# Patient Record
Sex: Male | Born: 1945 | Race: White | Hispanic: No | Marital: Married | Smoking: Current every day smoker
Health system: Southern US, Community
[De-identification: ages and names within clinical notes are randomized; demographics above are authoritative.]

## PROBLEM LIST (undated history)

## (undated) DIAGNOSIS — I219 Acute myocardial infarction, unspecified: Secondary | ICD-10-CM

## (undated) DIAGNOSIS — I2510 Atherosclerotic heart disease of native coronary artery without angina pectoris: Secondary | ICD-10-CM

## (undated) DIAGNOSIS — E785 Hyperlipidemia, unspecified: Secondary | ICD-10-CM

## (undated) DIAGNOSIS — K219 Gastro-esophageal reflux disease without esophagitis: Secondary | ICD-10-CM

## (undated) DIAGNOSIS — I6529 Occlusion and stenosis of unspecified carotid artery: Secondary | ICD-10-CM

## (undated) DIAGNOSIS — I739 Peripheral vascular disease, unspecified: Secondary | ICD-10-CM

## (undated) DIAGNOSIS — Z951 Presence of aortocoronary bypass graft: Secondary | ICD-10-CM

## (undated) DIAGNOSIS — I679 Cerebrovascular disease, unspecified: Secondary | ICD-10-CM

## (undated) DIAGNOSIS — K649 Unspecified hemorrhoids: Secondary | ICD-10-CM

## (undated) DIAGNOSIS — Z973 Presence of spectacles and contact lenses: Secondary | ICD-10-CM

## (undated) DIAGNOSIS — Z972 Presence of dental prosthetic device (complete) (partial): Secondary | ICD-10-CM

## (undated) DIAGNOSIS — S37019A Minor contusion of unspecified kidney, initial encounter: Secondary | ICD-10-CM

## (undated) HISTORY — PX: NECK SURGERY: SHX720

## (undated) HISTORY — DX: Atherosclerotic heart disease of native coronary artery without angina pectoris: I25.10

## (undated) HISTORY — DX: Hyperlipidemia, unspecified: E78.5

## (undated) HISTORY — PX: TONSILLECTOMY: SUR1361

## (undated) HISTORY — DX: Minor contusion of unspecified kidney, initial encounter: S37.019A

## (undated) HISTORY — PX: EYE SURGERY: SHX253

## (undated) HISTORY — DX: Occlusion and stenosis of unspecified carotid artery: I65.29

## (undated) HISTORY — PX: COLONOSCOPY WITH ESOPHAGOGASTRODUODENOSCOPY (EGD): SHX5779

## (undated) HISTORY — DX: Acute myocardial infarction, unspecified: I21.9

## (undated) HISTORY — PX: CORONARY ARTERY BYPASS GRAFT: SHX141

## (undated) HISTORY — DX: Cerebrovascular disease, unspecified: I67.9

## (undated) HISTORY — DX: Presence of aortocoronary bypass graft: Z95.1

---

## 1983-09-21 DIAGNOSIS — I219 Acute myocardial infarction, unspecified: Secondary | ICD-10-CM

## 1983-09-21 HISTORY — DX: Acute myocardial infarction, unspecified: I21.9

## 1993-09-20 HISTORY — PX: CERVICAL SPINE SURGERY: SHX589

## 1999-12-15 ENCOUNTER — Encounter: Payer: Self-pay | Admitting: Cardiology

## 1999-12-15 ENCOUNTER — Inpatient Hospital Stay (HOSPITAL_COMMUNITY): Admission: AD | Admit: 1999-12-15 | Discharge: 1999-12-16 | Payer: Self-pay | Admitting: Cardiology

## 1999-12-16 ENCOUNTER — Encounter: Payer: Self-pay | Admitting: Cardiology

## 2003-09-21 DIAGNOSIS — S37019A Minor contusion of unspecified kidney, initial encounter: Secondary | ICD-10-CM

## 2003-09-21 HISTORY — DX: Minor contusion of unspecified kidney, initial encounter: S37.019A

## 2004-09-02 ENCOUNTER — Ambulatory Visit: Payer: Self-pay | Admitting: Cardiology

## 2004-12-11 ENCOUNTER — Ambulatory Visit: Payer: Self-pay

## 2004-12-11 ENCOUNTER — Ambulatory Visit: Payer: Self-pay | Admitting: Cardiology

## 2006-09-15 ENCOUNTER — Ambulatory Visit: Payer: Self-pay | Admitting: Cardiology

## 2007-01-11 ENCOUNTER — Ambulatory Visit: Payer: Self-pay | Admitting: Cardiology

## 2007-01-11 ENCOUNTER — Inpatient Hospital Stay (HOSPITAL_COMMUNITY): Admission: EM | Admit: 2007-01-11 | Discharge: 2007-01-12 | Payer: Self-pay | Admitting: Emergency Medicine

## 2007-01-12 ENCOUNTER — Encounter: Payer: Self-pay | Admitting: Vascular Surgery

## 2007-01-12 ENCOUNTER — Ambulatory Visit: Payer: Self-pay | Admitting: Vascular Surgery

## 2007-01-20 ENCOUNTER — Ambulatory Visit: Payer: Self-pay

## 2007-01-26 ENCOUNTER — Ambulatory Visit: Payer: Self-pay | Admitting: Cardiology

## 2008-09-20 DIAGNOSIS — I219 Acute myocardial infarction, unspecified: Secondary | ICD-10-CM

## 2008-09-20 HISTORY — DX: Acute myocardial infarction, unspecified: I21.9

## 2008-10-29 ENCOUNTER — Encounter: Payer: Self-pay | Admitting: Cardiology

## 2008-11-10 ENCOUNTER — Encounter: Payer: Self-pay | Admitting: Cardiology

## 2009-01-06 ENCOUNTER — Telehealth: Payer: Self-pay | Admitting: Cardiology

## 2009-01-28 ENCOUNTER — Telehealth: Payer: Self-pay | Admitting: Cardiology

## 2009-02-18 DIAGNOSIS — E785 Hyperlipidemia, unspecified: Secondary | ICD-10-CM

## 2009-02-18 DIAGNOSIS — R5383 Other fatigue: Secondary | ICD-10-CM

## 2009-02-18 DIAGNOSIS — R5381 Other malaise: Secondary | ICD-10-CM

## 2009-02-18 DIAGNOSIS — I2510 Atherosclerotic heart disease of native coronary artery without angina pectoris: Secondary | ICD-10-CM

## 2009-02-18 DIAGNOSIS — R61 Generalized hyperhidrosis: Secondary | ICD-10-CM | POA: Insufficient documentation

## 2009-02-18 DIAGNOSIS — I679 Cerebrovascular disease, unspecified: Secondary | ICD-10-CM

## 2009-02-18 DIAGNOSIS — I219 Acute myocardial infarction, unspecified: Secondary | ICD-10-CM | POA: Insufficient documentation

## 2009-02-19 ENCOUNTER — Encounter: Payer: Self-pay | Admitting: Physician Assistant

## 2009-02-19 ENCOUNTER — Ambulatory Visit: Payer: Self-pay | Admitting: Cardiology

## 2009-02-19 DIAGNOSIS — I25810 Atherosclerosis of coronary artery bypass graft(s) without angina pectoris: Secondary | ICD-10-CM

## 2009-02-26 ENCOUNTER — Telehealth: Payer: Self-pay | Admitting: Cardiology

## 2009-03-11 ENCOUNTER — Telehealth: Payer: Self-pay | Admitting: Cardiology

## 2009-04-03 ENCOUNTER — Encounter: Payer: Self-pay | Admitting: Cardiology

## 2009-06-11 ENCOUNTER — Encounter: Payer: Self-pay | Admitting: Cardiology

## 2009-06-11 ENCOUNTER — Ambulatory Visit: Payer: Self-pay | Admitting: Cardiology

## 2009-06-13 LAB — CONVERTED CEMR LAB
Albumin: 4.1 g/dL (ref 3.5–5.2)
HDL: 34 mg/dL — ABNORMAL LOW (ref >39)
Indirect Bilirubin: 0.3 mg/dL (ref 0.0–0.9)
LDL Cholesterol: 77 mg/dL (ref 0–99)
Total CHOL/HDL Ratio: 4.1
Total Protein: 7.1 g/dL (ref 6.0–8.3)
Triglycerides: 136 mg/dL (ref <150)
VLDL: 27 mg/dL (ref 0–40)

## 2009-12-19 ENCOUNTER — Telehealth: Payer: Self-pay | Admitting: Cardiology

## 2010-02-05 ENCOUNTER — Telehealth: Payer: Self-pay | Admitting: Cardiology

## 2010-02-17 ENCOUNTER — Telehealth: Payer: Self-pay | Admitting: Cardiology

## 2010-03-09 ENCOUNTER — Telehealth: Payer: Self-pay | Admitting: Cardiology

## 2010-06-24 ENCOUNTER — Telehealth: Payer: Self-pay | Admitting: Cardiology

## 2010-07-15 ENCOUNTER — Encounter: Payer: Self-pay | Admitting: Cardiology

## 2010-07-15 ENCOUNTER — Encounter (INDEPENDENT_AMBULATORY_CARE_PROVIDER_SITE_OTHER): Payer: Self-pay | Admitting: *Deleted

## 2010-07-15 ENCOUNTER — Ambulatory Visit: Payer: Self-pay | Admitting: Cardiology

## 2010-07-15 DIAGNOSIS — F17200 Nicotine dependence, unspecified, uncomplicated: Secondary | ICD-10-CM

## 2010-07-17 ENCOUNTER — Encounter (INDEPENDENT_AMBULATORY_CARE_PROVIDER_SITE_OTHER): Payer: Self-pay | Admitting: *Deleted

## 2010-07-17 LAB — CONVERTED CEMR LAB
ALT: 16 units/L (ref 0–53)
AST: 23 units/L (ref 0–37)
Albumin: 4 g/dL (ref 3.5–5.2)
Alkaline Phosphatase: 89 units/L (ref 39–117)
Cholesterol: 143 mg/dL (ref 0–200)
HDL: 32 mg/dL — ABNORMAL LOW (ref >39)
Total Protein: 6.6 g/dL (ref 6.0–8.3)
Triglycerides: 130 mg/dL (ref <150)

## 2010-08-03 ENCOUNTER — Ambulatory Visit: Payer: Self-pay

## 2010-08-03 ENCOUNTER — Encounter: Payer: Self-pay | Admitting: Cardiology

## 2010-09-29 ENCOUNTER — Telehealth: Payer: Self-pay | Admitting: Cardiology

## 2010-10-20 NOTE — Progress Notes (Signed)
Summary: Clarify dosage / refill   Phone Note Call from Patient Call back at Home Phone [phone]   Refills Requested: Medication #1:  SIMVASTATIN 40 MG TABS Take one tablet by mouth daily at bedtime  Medication #2:  METOPROLOL TARTRATE 25 MG TABS take 1 1/2 tablet two times a day.  Medication #3:  OMEPRAZOLE 20 MG CPDR take one daily Caller: Spouse Reason for Call: Talk to Nurse Summary of Call: pls clarify dosage of simvastin suppose to be 20 mg vs 40 mg. [facility] [phone]. fax #[phone] / [employer] id # [id number] Initial call taken by: Lorne Skeens,  Feb 17, 2010 8:08 AM  Follow-up for Phone Call        spoke with [facility] script sent out. pt spouse questions answered. Deliah Goody, RN  Feb 17, 2010 5:20 PM

## 2010-10-20 NOTE — Assessment & Plan Note (Signed)
Summary: [facility]   Visit Type:  1 year follow up Primary Emalynn Clewis:  Lurena Nida  CC:  No complaints.  History of Present Illness: Pleasant gentleman I have seen in the past for coronary disease. The patient underwent coronary artery bypass and graft in [place] in February of 2010 (LIMA to the LAD, saphenous vein graft to the circumflex, and free RIMA to the ramus). Note an echocardiogram in February of 2010 showed an ejection fraction of 50-55%. He also has a history of cerebrovascular disease.  Outside carotid Dopplers in February of 2010 showed a 60% left internal carotid artery stenosis. An abdominal ultrasound in March of 2006 showed no aneurysm.  He was last seen in Sept 2010. Since then he denies any dyspnea on exertion, orthopnea, PND, pedal edema, palpitations, syncope or chest pain.   Current Medications (verified): 1)  Lipitor 80 Mg Tabs (Atorvastatin Calcium) .... Take One Tablet By Mouth Daily. 2)  Omeprazole 20 Mg Cpdr (Omeprazole) .... Take One Daily 3)  Bayer Aspirin 325 Mg Tabs (Aspirin) .... Take One Daily 4)  Metoprolol Tartrate 50 Mg Tabs (Metoprolol Tartrate) .... Take One and One Half  Tablet By Mouth Twice A Day  Allergies (verified): No Known Drug Allergies  Past History:  Past Medical History: Reviewed history from 06/11/2009 and no changes required. 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 Status post coronary artery bypass and graft in February of 2010. Procedure performed in [facility].  Social History: Reviewed history from 06/11/2009 and no changes required. Full Time Married  Tobacco Use -quit 10/2008-50pk yr hx; however now smoking cigars. Alcohol Use - yes Regular Exercise - no Drug Use - no  Review of Systems       no fevers or chills, productive cough,  hemoptysis, dysphasia, odynophagia, melena, hematochezia, dysuria, hematuria, rash, seizure activity, orthopnea, PND, pedal edema, claudication. Remaining systems are negative.   Vital Signs:  Patient profile:   65 year old male Height:      67 inches Weight:      211.25 pounds BMI:     33.21 Pulse rate:   90 / minute Pulse rhythm:   regular Resp:     18 per minute BP sitting:   132 / 72  (left arm)  Vitals Entered By: Vikki Ports (July 15, 2010 9:34 AM)  Physical Exam  General:  Well-developed well-nourished in no acute distress.  Skin is warm and dry.  HEENT is normal.  Neck is supple. No thyromegaly.  Chest is clear to auscultation with normal expansion.  Cardiovascular exam is regular rate and rhythm.  Abdominal exam nontender or distended. No masses palpated. Extremities show no edema. neuro grossly intact    EKG  Procedure date:  07/15/2010  Findings:      Sinus rhythm. Cannot rule out prior inferior infarct.  Impression & Recommendations:  Problem # 1:  CAD, ARTERY BYPASS GRAFT (ICD-414.04) Ctinue aspirin, beta blocker and statin. Each respective modification including diet and exercise. His updated medication list for this problem includes:    Bayer Aspirin 325 Mg Tabs (Aspirin) .Marland Kitchen... Take one daily    Metoprolol Tartrate 50 Mg Tabs (Metoprolol tartrate) .Marland Kitchen... Take one and one half  tablet by mouth twice a day  Problem # 2:  HYPERLIPIDEMIA-MIXED (ICD-272.4) Continue statin. Check lipids and liver. His updated medication list for this problem includes:    Lipitor 80 Mg Tabs (Atorvastatin  calcium) .Marland Kitchen... Take one tablet by mouth daily.  Problem # 3:  CEREBROVASCULAR DISEASE (ICD-437.9) Continue aspirin and statin. Scheduled followup carotid Dopplers. Orders: Carotid Duplex (Carotid Duplex)  Problem # 4:  TOBACCO ABUSE (ICD-305.1) Patient counseled on discontinuing.  Patient Instructions: 1)  Your physician recommends that you schedule a follow-up  appointment in: ONE YEAR 2)  Your physician has requested that you have a carotid duplex. This test is an ultrasound of the carotid arteries in your neck. It looks at blood flow through these arteries that supply the brain with blood. Allow one hour for this exam. There are no restrictions or special instructions.

## 2010-10-20 NOTE — Progress Notes (Signed)
Summary: check mg on medication  Phone Note Call from Patient Call back at Home Phone [phone]   Caller: Spouse wife dreama [phone] Reason for Call: Talk to Nurse Summary of Call: rx for metoprolol was sent by [facility] in a 25 mg-wife states it should be 50 mg-pls call Initial call taken by: Glynda Jaeger,  March 09, 2010 8:14 AM  Follow-up for Phone Call        spoke with Ethan Estrada, he is currently taking metoprolol tart 50mg  1 1/2 tablet twice daily. will resend correct script to [facility] ,Deliah Goody, RN  March 09, 2010 3:31 PM\par    New/Updated Medications: METOPROLOL TARTRATE 50 MG TABS (METOPROLOL TARTRATE) Take one and one half  tablet by mouth twice a day Prescriptions: METOPROLOL TARTRATE 50 MG TABS (METOPROLOL TARTRATE) Take one and one half  tablet by mouth twice a day  #135 x 3   Entered by:   Deliah Goody, RN   Authorized by:   Ferman Hamming, MD, [facility]   Signed by:   Deliah Goody, RN on 03/09/2010   Method used:   Faxed to ...       [facility] (mail-order)             , [place]         Ph: [phone]       Fax: [phone]   RxID:   [id number]

## 2010-10-20 NOTE — Progress Notes (Signed)
Summary: gen rx/rtn your call from yesterday  Phone Note Call from Patient Call back at Home Phone [phone]   Caller: pt Reason for Call: Talk to Nurse Summary of Call: pt needs to talk with nurse about calling rx for lipitor or ordering rx under generics. Initial call taken by: Faythe Ghee,  December 19, 2009 3:27 PM  Follow-up for Phone Call        spoke with pt wife, [facility] has let the pt know he can get generic statin for free. he is currently on lipitor 80mg  and wants to know if he can switch to simvastatin, lovastatin or pravachol.pt aware he will probably not be able to reach goal but he would like to know if he can change. will foward for dr Jens Som review. Deliah Goody, RN  December 19, 2009 4:01 PM   Additional Follow-up for Phone Call Additional follow up Details #1::        he will not be at goal but if absolutely necessary, dc lipitor and begin zocor 40 q day; lipids and liver in six weeks Ferman Hamming, MD, [facility]  December 19, 2009 5:00 PM  Left message to call back Deliah Goody, RN  December 19, 2009 5:24 PM spouse rtn your call from yesterday Omer Jack  December 24, 2009 1:20 PM     Additional Follow-up for Phone Call Additional follow up Details #2::    spoke with pt wife, they are aware of the fact that pt will not reach goal on the generic statins. they will cont to take lipitor for now Deliah Goody, RN  December 24, 2009 4:09 PM

## 2010-10-20 NOTE — Letter (Signed)
Summary: Custom - Lipid  [facility], Main Office  [address]   [place], [place] [address]   Phone: [phone]  Fax: [phone]     July 17, 2010 MRN: [id number]   [facility] [address] [place], [place]  [address]   Dear Ethan Estrada,  We have reviewed your cholesterol results.  They are as follows:     Total Cholesterol:    143 (Desirable: less than 200)       HDL  Cholesterol:     32  (Desirable: greater than 40 for men and 50 for women)       LDL Cholesterol:       85  (Desirable: less than 100 for low risk and less than 70 for moderate to high risk)       Triglycerides:       130  (Desirable: less than 150)  Our recommendations include:These numbers look good. Continue on the same medicine. Liver function is normal. Take care, Dr. Darel Hong.    Call our office at the number listed above if you have any questions.  Lowering your LDL cholesterol is important, but it is only one of a large number of "risk factors" that may indicate that you are at risk for heart disease, stroke or other complications of hardening of the arteries.  Other risk factors include:   A.  Cigarette Smoking* B.  High Blood Pressure* C.  Obesity* D.   Low HDL Cholesterol (see yours above)* E.   Diabetes Mellitus (higher risk if your is uncontrolled) F.  Family history of premature heart disease G.  Previous history of stroke or cardiovascular disease    *These are risk factors YOU HAVE CONTROL OVER.  For more information, visit .  There is now evidence that lowering the TOTAL CHOLESTEROL AND LDL CHOLESTEROL can reduce the risk of heart disease.  The American Heart Association recommends the following guidelines for the treatment of elevated cholesterol:  1.  If there is now current heart disease and less than two risk factors, TOTAL CHOLESTEROL should be less than 200 and LDL CHOLESTEROL should be less than 100. 2.  If there is current heart disease or two or  more risk factors, TOTAL CHOLESTEROL should be less than 200 and LDL CHOLESTEROL should be less than 70.  A diet low in cholesterol, saturated fat, and calories is the cornerstone of treatment for elevated cholesterol.  Cessation of smoking and exercise are also important in the management of elevated cholesterol and preventing vascular disease.  Studies have shown that 30 to 60 minutes of physical activity most days can help lower blood pressure, lower cholesterol, and keep your weight at a healthy level.  Drug therapy is used when cholesterol levels do not respond to therapeutic lifestyle changes (smoking cessation, diet, and exercise) and remains unacceptably high.  If medication is started, it is important to have you levels checked periodically to evaluate the need for further treatment options.  Thank you,    [employer] Team

## 2010-10-20 NOTE — Progress Notes (Signed)
Summary: pt wants generic med  Phone Note Call from Patient Call back at Home Phone [phone]   Caller: Spouse/[name] Reason for Call: Talk to Nurse, Talk to Doctor Summary of Call: pt wants to change to simvastatin instead of lipitor please call into mail order [name] ID number [id number] Initial call taken by: Omer Jack,  Feb 05, 2010 12:10 PM  Follow-up for Phone Call        spoke with pt wife, they are aware his cholestrol will not be at goal but they need to change. they are aware the pt needs blood work in 6 weeks Deliah Goody, RN  Feb 05, 2010 3:53 PM      New/Updated Medications: SIMVASTATIN 40 MG TABS (SIMVASTATIN) Take one tablet by mouth daily at bedtime Prescriptions: SIMVASTATIN 40 MG TABS (SIMVASTATIN) Take one tablet by mouth daily at bedtime  #90 x 3   Entered by:   Deliah Goody, RN   Authorized by:   Ferman Hamming, MD, [facility]   Signed by:   Deliah Goody, RN on 02/05/2010   Method used:   Faxed to ...       [facility] (mail-order)             , [place]         Ph: [phone]       Fax: [phone]   RxID:   [id number]

## 2010-10-20 NOTE — Progress Notes (Signed)
Summary: talk to nurse  Phone Note Call from Patient Call back at Home Phone [phone]   Caller: Spouse Reason for Call: Talk to Nurse Summary of Call: per pt wife. pt has appt in October pt will be out of lipitor needs script sent to [facility] but she needs to talk to nurse before that is sent in.  Initial call taken by: Edman Circle,  June 24, 2010 12:34 PM  Follow-up for Phone Call        spoke with pt wife, questions answered Deliah Goody, RN  June 24, 2010 2:03 PM     New/Updated Medications: LIPITOR 80 MG TABS (ATORVASTATIN CALCIUM) Take one tablet by mouth daily. Prescriptions: LIPITOR 80 MG TABS (ATORVASTATIN CALCIUM) Take one tablet by mouth daily.  #90 x 4   Entered by:   Deliah Goody, RN   Authorized by:   Ferman Hamming, MD, [facility]   Signed by:   Deliah Goody, RN on 06/24/2010   Method used:   Faxed to ...       [facility] (mail-order)             , [place]         Ph: [phone]       Fax: [phone]   RxID:   [id number]

## 2010-10-20 NOTE — Miscellaneous (Signed)
Summary: Orders Update  Clinical Lists Changes  Orders: Added new Test order of T-Hepatic Function ([id number]) - Signed Added new Test order of T-Lipid Profile ([id number]) - Signed 

## 2010-10-22 NOTE — Progress Notes (Signed)
Summary: wife request call  Phone Note Call from Patient Call back at Home Phone [phone]   Caller: Spouse/ [name] Summary of Call: Pt wife request call Initial call taken by: Judie Grieve,  September 29, 2010 3:14 PM    Prescriptions: LIPITOR 80 MG TABS (ATORVASTATIN CALCIUM) Take one tablet by mouth daily.  #90 x 4   Entered by:   Deliah Goody, RN   Authorized by:   Ferman Hamming, MD, [facility]   Signed by:   Deliah Goody, RN on 09/29/2010   Method used:   Electronically to        [facility] [address] [phone]* (retail)       [address]       [place], [place]  [address]       Ph: [phone]       Fax: [phone]   RxID:   [id number]

## 2011-02-02 NOTE — Assessment & Plan Note (Signed)
[facility]                            CARDIOLOGY OFFICE NOTE   NAME:Aldaz, Inman                       MRN:          [id number]  DATE:01/26/2007                            DOB:          1946-03-06    Mr. Franckowiak is a pleasant gentleman who has a history of coronary  disease, cerebrovascular disease, tobacco abuse, and hyperlipidemia.  He  was recently admitted to [facility] with an episode of flushing  from head to feet associated with weakness, nausea, and diaphoresis.  He  did not have any chest discomfort.  He ruled out for myocardial  infarction with serial enzymes.  He also had an MRI/MRA that showed no  evidence of stenosis or occlusion.  There was nonspecific subcortical  white matter disease.  Carotid Dopplers showed antegrade vertebral  bilaterally and no significant left or right internal carotid artery  stenosis.  The patient was discharged and had an outpatient Myoview  which was performed on Jan 20, 2007.  He had an inferobasal infarct with  minimal peri-infarct ischemia.  The ejection fraction was 53%.  Since  then, he has done well with no dyspnea, chest pain, palpitations,  syncope, or dizziness.   MEDICATIONS:  1. Lipitor 80 mg p.o. daily.  2. Imdur 60 mg p.o. daily.  3. Aspirin 81 mg p.o. daily.  4. Omeprazole 20 mg p.o. daily.  5. Atenolol 25 mg p.o. daily.   PHYSICAL EXAMINATION:  VITAL SIGNS:  Show a blood pressure of 145/92.  His pulse is 81.  He weighs 201 pounds.  CHEST:  Clear.  CARDIOVASCULAR:  Reveals a regular rate and rhythm.  EXTREMITIES:  Showed no edema.   DIAGNOSES:  1. Recent presyncopal/dizzy episode of uncertain etiology.  His      neurological evaluation and cardiac workup have been negative.  We      will continue to follow this and work up further if recurrent.  2. Coronary artery disease.  He will continue with his medications      including aspirin, Lipitor.  3. Tobacco abuse.  We again  discussed the importance of discontinuing      this.  4. Cerebrovascular disease.  His followup carotid Dopplers showed      nonobstructive disease.  We will need to repeat these in one year.  5. Hypertension.  His blood pressure is mildly elevated and we will      keep track of this and add medications as needed.   I will see him back in 6 months.    Madolyn Frieze Jens Som, MD, [facility]  Electronically Signed   BSC/MedQ  DD: 01/26/2007  DT: 01/26/2007  Job #: [id number]

## 2011-02-02 NOTE — Discharge Summary (Signed)
Ethan Estrada, Ethan Estrada NO.:  [id number]   MEDICAL RECORD NO.:  [id number]          PATIENT TYPE:  INP   LOCATION:  3703                         FACILITY:  [facility]   PHYSICIAN:  Luis Abed, MD, FACCDATE OF BIRTH:  [birth year]   DATE OF ADMISSION:  01/11/2007  DATE OF DISCHARGE:  01/12/2007                         DISCHARGE SUMMARY - REFERRING   DISCHARGE DIAGNOSES:  1. Near syncope of uncertain etiology.  2. Tobacco use.  3. Hyperlipidemia.  4. History as noted below.   BRIEF HISTORY:  Mr. 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  He  denied any chest discomfort or shortness of breath.  There is not any  evidence of tremors or  seizure activities.  He had similar symptoms  associated with a myocardial infarction in 1985.  Because of this he  presented to the emergency room.  The discomfort lasted a total of one  and half hours.  He also experienced abdominal discomfort.   PAST MEDICAL HISTORY:  Notable for known coronary artery disease with  myocardial infarction and intervention in the 1980s, history not  available, hyperlipidemia, renal hematoma in 2005, rectal bleeding  within the last 3 months and a colonoscopy has been scheduled per his  primary care physician.  Last stress test in 2004 was unremarkable.   LABORATORY:  CT of the abdomen and pelvis showed tiny calcifications in  the kidneys but felt that these were vascular, small hiatal hernia,  atherosclerosis of the aorta without aneurysm.  No acute findings in the  pelvis.  He does have diverticulosis without imaging evidence of  diverticulitis.  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  MRI  performed on the 24th did not show any evidence of acute  ischemia.  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  No evidence of stenosis  or occlusion, dominant right PCOM.  EKGs showed normal sinus rhythm, normal axis, early R-wave, nonspecific  ST-T wave changes.  Carotid Dopplers performed on the 24th showed  antegrade vertebrals bilaterally, no significant left or right ICA  stenosis.  Did have a left ECA stenosis representative of mild and  heterogenic plaque in the proximal portion.  Admission weight was 90.4 kg.  Admission H&H 14.1 and 42.3, normal  indices, platelets 215, WBCs 12.6, PTT 27, PT 13.5, sodium 139,  potassium 3.6, BUN eight, creatinine 1.11, glucose 112, normal LFTs,  lipase 30.  CK MBs and troponins were within normal limits x3.  Total  cholesterol was 124, triglycerides 157, HDL 27, LDL 56.  TSH 1.583.  Urinalysis was unremarkable.   HOSPITAL COURSE:  This patient was admitted to 3700.  Dr. Jens Som felt  that this was not an acute cardiac issue, however he was admitted for  observation.  Neurology was consulted for possible  vertebral disease.  The previously mentioned tests were performed.  After the results came  back neurology felt that the patient could be discharged home.  Dr. Myrtis Ser  agreed with the arrangement of a outpatient stress Myoview and follow-up  appointment.  Progression nurse and tobacco cessation also done with  discharge planning and counseling.   DISPOSITION:  The patient is discharged home.  He is advised no driving  until after his stress test and follow-up appointment with Dr. Jens Som.  He is asked to continue his home medications. These include Lipitor 80  mg daily at bedtime, Imdur 30 daily,  aspirin 81 daily, atenolol 25 mg  daily, omeprazole 20 mg daily and Lexapro 10 mg daily.  He was also  asked to arrange a 2-week follow-up appointment with Dr. Anne Hahn to  discontinue  smoking.  Our office will call him at home with arrangements  for a stress Myoview and follow-up appointment with Dr. Jens Som.   DISCHARGE TIME:  40 minutes,      Joellyn Rued, PA-C      Luis Abed, MD, [facility]  Electronically Signed    EW/MEDQ  D:  01/12/2007  T:  01/12/2007  Job:  [id number]   cc:   Madolyn Frieze. Jens Som, MD, [facility]  Dr. Patrick Jupiter

## 2011-02-05 NOTE — H&P (Signed)
NAMEMEDARD, DECUIR              ACCOUNT NO.:  [id number]   MEDICAL RECORD NO.:  [id number]          PATIENT TYPE:  INP   LOCATION:  1846                         FACILITY:  [facility]   PHYSICIAN:  Madolyn Frieze. Jens Som, MD, FACCDATE OF BIRTH:  May 06, 1946   DATE OF ADMISSION:  01/11/2007  DATE OF DISCHARGE:                              HISTORY & PHYSICAL   PRIMARY CARDIOLOGIST:  Dr. Olga Millers.   PRIMARY CARE PHYSICIAN:  Dr. Alessandra Grout in [facility].   HISTORY OF PRESENT ILLNESS:  This is a 65 year old Caucasian male who  was sitting at his desk at work in front of the computer who experienced  a sudden onset of flushing from his head to his feet, near syncope  weakness and nausea.  He had associated diaphoresis.  He denied any  chest pain.  He denied any shortness of breath.  He denied any evidence  of tremors.  There was no evidence of seizure activity.  The patient  states he had similar symptoms transiently associated with some  myocardial infarction in 1985.  Secondary to this, the patient had EMS  called from his workplace, and he arrived in the emergency room.  The  symptoms lasted approximately 1-1/2 hours.  After arrival to the  emergency room, the patient experienced abdominal pain on the left upper  abdomen.  He was treated with morphine and Zofran.  The patient is  currently resting comfortably in the ER without any further complaints.   PAST MEDICAL HISTORY:  1. The patient has a past medical history of coronary artery disease      two vessel with intervention in the 1980's after MI.  2. Also, a history of hyperlipidemia.  3. The patient has a history of renal hematoma in 2005.  4. Rectal bleeding within the last three months with a colonoscopy      scheduled per his primary care physician.   The patient did have a Cardiolite stress test in 2004 revealing negative  for ischemia.   PAST SURGICAL HISTORY:  Hemorrhoidectomy.   SOCIAL HISTORY:  The patient lives in [place] with his wife.  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  He is a  60-pack-year smoker.  He has social drinks, probably 45 a week.  No use  of drugs.  He gets no exercise.  He is not on a special diet.   FAMILY HISTORY:  Mother died at age 48 from leukemia.  He also had a  history of hypertension.  Father died at age 66 from an MI.  Mother was  a blue baby and died at age 50.   CURRENT MEDICATIONS:  1. Lipitor 80 mg once a day.  2. Imdur 30 mg once a day.  3. Aspirin 81 mg once a day.  4. Omeprazole 20 mg daily.  5. Atenolol 25 mg daily.   ALLERGIES:  No known drug allergies.   CURRENT LABORATORY DATA:  Sodium 139, potassium 3.6, chloride 111, CO2  23, BUN 8, creatinine 1.1, glucose 112, hemoglobin 14.1, hematocrit  42.3, white blood cells 12.6,  platelets 215.  AST 23, ALT 21, lipids 30,  albumin 3.2, calcium 8.2, PTT 27, PT 13.5, INR 1.0.   EKG revealing normal sinus rhythm with a ventricular rate of 64 beats  per minute.   CT scan of the abdomen revealed no renal stones and evidence of  diverticulitis without contrast confirmation.   PHYSICAL EXAMINATION:  Blood pressure 119/80, pulse 69, respirations 20,  temperature 97.6.  HEENT:  Head:  Normocephalic, atraumatic.  Eyes:  PERRLA, mucous  membranes of mouth pink and moist.  Tongue is midline.  NECK:  Supple.  There are carotid bruits bilaterally.  No thyromegaly is  noted.  CARDIOVASCULAR:  Regular rate and rhythm without murmurs, rubs, or  gallops.  Radial pulses and dorsalis pulses are 1+ bilaterally.  LUNGS:  Essentially clear to auscultation with some mild bibasilar  crackles.  ABDOMEN:  Soft, nontender, hypoactive bowel sounds, no organomegaly, and  no rebound or tenderness.  EXTREMITIES:  Without clubbing, cyanosis, or edema.  NEUROLOGIC:  Intact.   IMPRESSION:  The patient is a 65 year old Caucasian male with a past  medical history of CAD, tobacco abuse, and cerebrovascular  disease with  hypertension and hypercholesterolemia with an episode of presyncope,  nausea.  Denies no recent dyspnea on exertion or chest pain or  palpitations.  The patient had this occur while sitting at a desk today  and became suddenly flushed and presyncopal followed by significant  nausea and vomiting.  The episode lasted approximately 1-1/2 hours.  There was no chest pain or shortness of breath.  No weakness or  diaphoresis, and loss of sensation in the extremities.   Cardiology has asked me to evaluate the patient's symptoms secondary to  the fact that this is similar to an myocardial infarction.  Note, the  patient developed dizziness and nausea sitting up.   EKG revealed normal sinus rhythm with no ST changes.  Initial enzymes  were negative.  Will admit to rule out myocardial infarction.  Check  enzymes.  If negative enzymes, will schedule outpatient Myoview.  Will  continue aspirin, Lopressor, and statin.  Question vertigo with  vertebrobasilar insufficiency.  Will consult Neurology for their  evaluation during hospitalization.  Will check carotids to evaluate  secondary to auscultated bruits.  The patient was explained this by Dr.  Jens Som who answered all questions and the patient verbalized  understanding.  He will be admitted overnight.  We will make further  recommendations based upon his hospital course.  Will plan discharge in  the a.m.  Follow up with Myoview.      Bettey Mare. Lyman Bishop, NP      Madolyn Frieze. Jens Som, MD, [facility]  Electronically Signed   KML/MEDQ  D:  01/11/2007  T:  01/11/2007  Job:  [id number]

## 2011-02-05 NOTE — Discharge Summary (Signed)
[place]  Patient:    Ethan Estrada, Ethan Estrada                       MRN: [id number] Adm. Date:  [id number] Disc. Date: 12/16/99 Attending:  Learta Codding Dictator:   Leonides Cave, P.A. CC:         Madolyn Frieze. Jens Som, M.D. [facility]                           Discharge Summary  DATE OF BIRTH:  Oct 05, 1945  ATTENDING PHYSICIAN:  Lewayne Bunting, M.D.  DISCHARGE DIAGNOSES: 1. Chest pain.  Near syncopal spell.  Unclear etiology.  Patient with CT    negative for pulmonary embolism.  Patient with stress Cardiolite revealing    no ischemia.  Ejection fraction 50%. 2. Coronary artery disease with stage intervention for two-vessel disease    15-years ago. 3. Hyperlipidemia. 4. History of continued smoking. 5. Family history of coronary disease.  BRIEF HISTORY:  The patient is a 65 year old, white male with a history of coronary artery disease who was seen by Abelino Derrick, P.A.C. and Dr. Lewayne Bunting in the [facility] Cardiology office on December 15, 1999.  The patient was at work on the day of admission when he had an episode of left shooting chest pain, followed by a near syncopal episode.  The patient was very concerned about the syncopal episode.  Symptoms are unlike what he had in the past, which was described as indigestion, followed by nausea and vomiting.  The patient has walked two miles 2-3 times a week and has not had any cardiac problems since his last intervention.  He denies nausea, vomiting, and diaphoresis, but he does feel "weak".  The patient was admitted from the office for a Cardiolite study to rule-out MI though his symptoms were somewhat atypical.  HOSPITAL COURSE:  The patient had no further chest pain while in the hospital and a slight bump in his CPKs at 336 at 247 respectively.  CK-MBs and troponin Is were negative x 2.  The patient had a GXT Cardiolite on December 16, 1999, and the results revealed no ischemia with ejection fraction 50%.  The  patient had a recent trip to [facility], and therefore, a spiral CT was ordered to rule-out pulmonary embolism.  This test was negative for pulmonary embolism.  The patient also has a history of mild, carotid stenosis, and so therefore, a carotid Doppler test was done since the patient had a presyncopal episode.  The results revealed mild plaque in the proximal ICA bifurcation on the right, and on the left, the patient had a mild plaque seen in the bifurcation short segment, and mild to moderate plaque seen in the proximal ICA.  There was no significant RICA stenosis, and 40-60% left ICA stenosis.  The patient was setup to be discharged home late in the evening of December 16, 1999 in stable condition.  He will continue the same medications he came into the hospital on.  DISCHARGE MEDICATIONS: 1. Coated aspirin q.d. 2. Prilosec 20 mg q.d. 3. Cozaar 50 mg q.d. 4. Imdur 30 mg q.d. 5. Atenolol 25 mg q.d. 6. Lipitor 40 mg q.d. 7. Niaspan 1 g q.d.  DISCHARGE ACTIVITIES:  The patient can return to activity as tolerated.  FOLLOWUP:  He will follow up with Dr. Jens Som on an as needed basis. DD:  12/16/99 TD:  12/16/99 Job: [id number]  [id number]

## 2011-02-05 NOTE — Consult Note (Signed)
NAMETUDOR, CHANDLEY NO.:  [id number]   MEDICAL RECORD NO.:  [id number]          PATIENT TYPE:  INP   LOCATION:  1846                         FACILITY:  [facility]   PHYSICIAN:  Marlan Palau, M.D.  DATE OF BIRTH:  1946-06-05   DATE OF CONSULTATION:  01/11/2007  DATE OF DISCHARGE:                                 CONSULTATION   HISTORY OF PRESENT ILLNESS:  Ethan Estrada is a 65 year old, right-  handed, white male born 22-Oct-1945,with a history of hypertension,  coronary artery disease, history of MI in the past.  The patient has  come to the [facility] Emergency Room today after an event that occurred  work around 11:30 a.m. today.  The patient was sitting at his desk and  suddenly felt a hot, flushing sensation starting in his head and working  down the body.  The patient became lightheaded and no true vertigo was  noted.  The patient felt that he might blackout and tried to slump down  in his chair.  He felt weak all over.  No headache, focal numbness or  weakness was noted.  The patient began throwing up had some tingling  sensations in his hands with that.  The patient was told he looked  blanched in the face and had significant diaphoresis with the above  event.  The patient was brought to [facility] Emergency Room and has  continued to feel poorly throughout the day, but now claims that he has  true vertigo when he tries to sit up.  He has nausea with this and  diaphoresis with this.  Again, no headache, no numbness or weakness on  the arms or legs, double vision, slurred speech or loss of vision.  The  patient never lost consciousness.  The patient claims he has never had  an event quite like this before.  The patient, however, has chronic  nausea and vomiting, usually in the morning that occurs almost daily.  Neurology is asked to see this patient for further evaluation.   PAST MEDICAL HISTORY:  1. Episode of near syncope with persistent vertigo,  nausea.  2. Coronary artery disease with history of MI.  3. Obesity.  4. Dyslipidemia.  5. Hemorrhoid surgery.  6. Esophageal leukoplakia.  7. Anxiety disorder.  8. Hypertension.  9. History of cervical spine surgery.   ALLERGIES:  No known drug allergies.   SOCIAL HISTORY:  Smokes a pack and a half of cigarettes daily.  Drinks  alcohol three to four times a week.   CURRENT MEDICATIONS:  1. Imdur 60 mg daily.  2. Lipitor 80 mg daily.  3. Omeprazole 20 mg daily.  4. Aspirin 81 mg daily.  5. Atenolol  25 mg daily.  6. Lexapro 10 mg daily.   SOCIAL HISTORY:  This patient is married.  He lives in the area of [place], [place].  He has two sons who are alive and well.  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   FAMILY HISTORY:  Notable that the patient's mother and father  died with  heart disease.  A brother died at age 58 of heart disease, but had some  sort of congenital heart disorder.   REVIEW OF SYSTEMS:  CONSTITUTIONAL:  Notable for no recent fevers or  chills.  The patient denies headache, neck pain or stiffness.  He had  not had any blackouts.  CARDIOPULMONARY:  Denies any trouble with  shortness of breath, chest pain, palpitations of the heart.  GI:  No  abdominal pain.  Denies problems controlling bowels or bladder.   PHYSICAL EXAMINATION:  VITAL SIGNS:  Blood pressure is 119/80, heart  rate 68, respirations 22, temperature afebrile.  GENERAL:  This patient is a moderately obese, white male who is alert,  cooperative at time of examination.  HEENT:  Head is atraumatic.  Pupils are equal, round and light.  Disks  are flat bilaterally.  NECK:  Supple.  No carotid bruits noted.  RESPIRATORY:  Examination is clear.  CARDIOVASCULAR:  Examination reveals a regular rate and rhythm.  No  obvious murmurs, rubs noted.  EXTREMITIES:  Without significant edema.  NEUROLOGIC:  As above.  Facial symmetry is present.  The patient has  good  sensation of face to pinprick, soft touch bilaterally.  He has good  strength of muscle with head turning and shrug bilaterally.  Speech is  well-enunciated and nonaphasic.  Motor testing reveals 5/5 strength in  all fours.  Good symmetric motor is noted throughout.  Sensory testing  is intact to pinprick, soft touch and vibratory sensation throughout.  The patient has good finger-to-nose bilaterally.  Gait was not tested.  Deep tendon reflexes are symmetric.  Toes are downgoing bilaterally.  No  pronator drift is seen.  Extraocular movements, end gaze, nystagmus is  seen bilaterally.  With sitting, the patient reports onset of vertigo  and nausea, but no alteration in eye movement pattern is seen.   LABORATORY DATA AND X-RAY FINDINGS:  White count 12.6, hemoglobin of  14.1, hematocrit 42.3, platelets 215, MCV of 92.1.  Troponin I less than  0.05, CK-MB fraction less than 1, INR 1.  Sodium 139, potassium 3.6,  chloride 111, CO2 23, glucose of 112, BUN of 8, creatinine 1.1, calcium  8.3.  Total protein 6, albumin of 3.2, AST of 23, ALT of 21.  Lipase of  30.   CT of the abdomen shows a possible renal stone, small hiatal hernia and  diverticulosis noted.   IMPRESSION:  1. History of near syncope.  2. Hypertension.  3. Coronary artery disease.   This patient has a nonfocal examination at this point.  The patient  gives a history that is consistent with vasovagal syncope initially with  flushing, diaphoresis, blanching of the face and nausea.  The patient,  however, has had more persistent symptoms of true vertigo increase with  sitting and associated with nausea and vomiting.  This is somewhat  atypical for true vasovagal pattern.  We do need to consider and rule-  out the possibility of cerebrovascular disease, but the patient does not  report any headache and has no focal findings on examination.  Will  pursue further workup at this point.  RECOMMENDATIONS:  1. MRI scan of  brain.  2. MRI angiogram of intracranial vessels.  3. Carotid Doppler study.  Will follow the patient's clinical course      while in house.      Marlan Palau, M.D.  Electronically Signed     CKW/MEDQ  D:  01/11/2007  T:  01/12/2007  Job:  [id number]

## 2011-02-05 NOTE — Assessment & Plan Note (Signed)
[facility]                            CARDIOLOGY OFFICE NOTE   NAME:Brownfield, Brannan                       MRN:          [id number]  DATE:09/15/2006                            DOB:          Dec 27, 1945    The patient returns for followup today. He has a history of coronary  disease, cerebrovascular disease, tobacco abuse and hyperlipidemia. His  most recent nuclear study was performed in March of 2006. At that time,  he was felt to have inferior scar, but no ischemia. The ejection  fraction was noted to be 48%. Since I last saw him, there is no  increased dyspnea, chest pain, palpitations or syncope. He has decreased  his tobacco use down to one-half pack per day. He is not exercising, but  he is following a diet.   MEDICATIONS:  1. Lipitor 80 mg p.o. daily.  2. Imdur 60 mg p.o. daily.  3. Aspirin 81 mg p.o. daily.  4. Omeprazole 20 mg p.o. daily.   PHYSICAL EXAMINATION:  Shows a blood pressure of 132/80 and his pulse is  64.  His neck is supple.  His chest is clear.  Cardiovascular: Reveals a regular rate and rhythm.  His extremities show no edema.   His electrocardiogram shows a sinus rhythm and a prior inferior infarct  cannot be excluded.   DIAGNOSES:  1. Coronary artery disease.  2. Hyperlipidemia.  3. Tobacco abuse.  4. Cerebral vascular disease.  5. Hypertension.   PLAN:  Mr. Wesch is doing well from a symptomatic standpoint. He will  need followup carotid Dopplers for his cerebral vascular disease. His  most recent LDL was greater than 70 and we will add Zetia to his  Lipitor. We will check lipids and liver in 6 weeks. We discussed the  importance of exercise as well as diet. He is working on discontinuing  his tobacco use. We will see him back in 12 months.     Madolyn Frieze Jens Som, MD, [facility]  Electronically Signed    BSC/MedQ  DD: 09/15/2006  DT: 09/15/2006  Job #: [id number]

## 2011-03-10 ENCOUNTER — Telehealth: Payer: Self-pay | Admitting: Cardiology

## 2011-03-10 MED ORDER — METOPROLOL TARTRATE 50 MG PO TABS: ORAL_TABLET | ORAL | Status: DC

## 2011-03-10 NOTE — Telephone Encounter (Signed)
ometrazole 20 mg.  Metoprolol 50 mg . 90 day supply. [facility] on [address] in [place].

## 2011-03-10 NOTE — Telephone Encounter (Signed)
Called pt and said i refilled the metoprolol but did not fill the omeprazole pt needs to ask primary MD for this refill

## 2011-05-04 ENCOUNTER — Encounter: Payer: Self-pay | Admitting: Cardiology

## 2011-08-24 ENCOUNTER — Encounter: Payer: Self-pay | Admitting: Cardiology

## 2011-08-25 ENCOUNTER — Encounter: Payer: Self-pay | Admitting: Cardiology

## 2011-08-25 ENCOUNTER — Ambulatory Visit (INDEPENDENT_AMBULATORY_CARE_PROVIDER_SITE_OTHER): Payer: Medicare Other | Admitting: Cardiology

## 2011-08-25 DIAGNOSIS — I679 Cerebrovascular disease, unspecified: Secondary | ICD-10-CM

## 2011-08-25 DIAGNOSIS — I2510 Atherosclerotic heart disease of native coronary artery without angina pectoris: Secondary | ICD-10-CM

## 2011-08-25 DIAGNOSIS — E785 Hyperlipidemia, unspecified: Secondary | ICD-10-CM

## 2011-08-25 DIAGNOSIS — F17200 Nicotine dependence, unspecified, uncomplicated: Secondary | ICD-10-CM

## 2011-08-25 NOTE — Patient Instructions (Addendum)
Your physician wants you to follow-up in: ONE YEAR You will receive a reminder letter in the mail two months in advance. If you don't receive a letter, please call our office to schedule the follow-up appointment.   Your physician has requested that you have a carotid duplex. This test is an ultrasound of the carotid arteries in your neck. It looks at blood flow through these arteries that supply the brain with blood. Allow one hour for this exam. There are no restrictions or special instructions.   Your physician recommends that you return for a FASTING lipid profile: WITH CAROTID DOPPLERS   DECREASE ASPIRIN TO 81 MG ONCE DAILY

## 2011-08-25 NOTE — Assessment & Plan Note (Signed)
Continue statin. Check lipids and liver. 

## 2011-08-25 NOTE — Assessment & Plan Note (Signed)
Discussed the importance of discontinuing cigar use.

## 2011-08-25 NOTE — Progress Notes (Signed)
HPI: Pleasant gentleman I have seen in the past for coronary disease. The patient underwent coronary artery bypass and graft in [place] in February of 2010 (LIMA to the LAD, saphenous vein graft to the circumflex, and free RIMA to the ramus). Note an echocardiogram in February of 2010 showed an ejection fraction of 50-55%. He also has a history of cerebrovascular disease.  Carotid Dopplers in Nov 2011 showed a 40-59 left and 0-39 right internal carotid artery stenosis. An abdominal ultrasound in March of 2006 showed no aneurysm.  He was last seen in Oct 2011. Since then, the patient denies any dyspnea on exertion, orthopnea, PND, pedal edema, palpitations, syncope or chest pain.    Current Outpatient Prescriptions  Medication Sig Dispense Refill  . amoxicillin (AMOXIL) 500 MG capsule As directed (for 2 wks)      . aspirin (BAYER ASPIRIN) 325 MG tablet Take 325 mg by mouth daily.        Marland Kitchen atorvastatin (LIPITOR) 80 MG tablet Take 80 mg by mouth daily.        . clarithromycin (BIAXIN) 500 MG tablet AS DIRECTED (for 2 wks)      . FLUVIRIN INJ injection As directed      . lansoprazole (PREVACID) 30 MG capsule AS DIRECTED  (for 2 wks)      . metoprolol (LOPRESSOR) 50 MG tablet 1 1/2 tab po bid  180 tablet  3     Past Medical History  Diagnosis Date  . Acute myocardial infarction, unspecified site, episode of care unspecified 1985  . Coronary atherosclerosis of unspecified type of vessel, native or graft   . Other and unspecified hyperlipidemia   . Cerebrovascular disease, unspecified   . Renal hematoma 2005    Past Surgical History  Procedure Date  . Hemorrhoid surgery   . Coronary artery bypass graft     S/P in February of 2010. Procedure performed in [place]    History   Social History  . Marital Status: Married    Spouse Name: N/A    Number of Children: N/A  . Years of Education: N/A   Occupational History  . Full Time    Social History Main Topics  . Smoking status:  Former Smoker -- 1.0 packs/day for 50 years    Types: Cigarettes    Quit date: 10/21/2008  . Smokeless tobacco: Not on file   Comment: Now smoking cigars  . Alcohol Use: Yes  . Drug Use: No  . Sexually Active: Not on file   Other Topics Concern  . Not on file   Social History Narrative   No Regular Exercise    ROS: no fevers or chills, productive cough, hemoptysis, dysphasia, odynophagia, melena, hematochezia, dysuria, hematuria, rash, seizure activity, orthopnea, PND, pedal edema, claudication. Remaining systems are negative.  Physical Exam: Well-developed well-nourished in no acute distress.  Skin is warm and dry.  HEENT is normal.  Neck is supple. No thyromegaly.  Chest is clear to auscultation with normal expansion.  Cardiovascular exam is regular rate and rhythm.  Abdominal exam nontender or distended. No masses palpated. Extremities show no edema. neuro grossly intact  ECG NSR, inferior MI

## 2011-08-25 NOTE — Assessment & Plan Note (Signed)
Plan continue aspirin, statin and beta blocker. Discussed risk factor modification.

## 2011-08-25 NOTE — Assessment & Plan Note (Signed)
Continue aspirin and statin. Scheduled follow-up carotid Dopplers. 

## 2011-10-12 ENCOUNTER — Telehealth: Payer: Self-pay | Admitting: Cardiology

## 2011-10-12 MED ORDER — ATORVASTATIN CALCIUM 80 MG PO TABS: 80.0000 mg | ORAL_TABLET | Freq: Every day | ORAL | Status: DC

## 2011-10-12 MED ORDER — METOPROLOL TARTRATE 50 MG PO TABS: ORAL_TABLET | ORAL | Status: DC

## 2011-10-12 NOTE — Telephone Encounter (Signed)
atoravastatin 80mg , metoprolol 50mg , [facility] [address] [place], pt out of both, needs called in today

## 2012-05-17 ENCOUNTER — Other Ambulatory Visit: Payer: Self-pay | Admitting: Cardiology

## 2012-06-13 ENCOUNTER — Other Ambulatory Visit: Payer: Self-pay | Admitting: Cardiology

## 2012-06-13 NOTE — Telephone Encounter (Signed)
Pt's wife called pt out needs asap

## 2013-02-28 ENCOUNTER — Telehealth: Payer: Self-pay

## 2013-02-28 NOTE — Telephone Encounter (Signed)
Pt 's wife called wanting a refill on generic lipitor - (90 day supply) I explained that pt had not been in in 2 years. Deliah Goody is on vacation this week ,so I was wondering call you help out with this matter? Pt and or his wife would like a return phone call within 48 hours.

## 2013-03-01 ENCOUNTER — Other Ambulatory Visit: Payer: Self-pay | Admitting: *Deleted

## 2013-03-01 MED ORDER — ATORVASTATIN CALCIUM 80 MG PO TABS: ORAL_TABLET | ORAL | Status: DC

## 2013-04-30 ENCOUNTER — Other Ambulatory Visit: Payer: Self-pay

## 2013-04-30 MED ORDER — METOPROLOL TARTRATE 50 MG PO TABS: ORAL_TABLET | ORAL | Status: DC

## 2013-06-13 ENCOUNTER — Ambulatory Visit: Payer: Medicare Other | Admitting: Cardiology

## 2013-06-20 ENCOUNTER — Ambulatory Visit (INDEPENDENT_AMBULATORY_CARE_PROVIDER_SITE_OTHER): Payer: Medicare Other | Admitting: Cardiology

## 2013-06-20 ENCOUNTER — Encounter: Payer: Self-pay | Admitting: Cardiology

## 2013-06-20 VITALS — BP 132/84 | HR 76 | Ht 67.0 in | Wt 203.0 lb

## 2013-06-20 DIAGNOSIS — E785 Hyperlipidemia, unspecified: Secondary | ICD-10-CM

## 2013-06-20 DIAGNOSIS — I2510 Atherosclerotic heart disease of native coronary artery without angina pectoris: Secondary | ICD-10-CM

## 2013-06-20 DIAGNOSIS — F17200 Nicotine dependence, unspecified, uncomplicated: Secondary | ICD-10-CM

## 2013-06-20 DIAGNOSIS — I679 Cerebrovascular disease, unspecified: Secondary | ICD-10-CM

## 2013-06-20 NOTE — Patient Instructions (Signed)
Your physician wants you to follow-up in: ONE YEAR WITH DR Shelda Pal will receive a reminder letter in the mail two months in advance. If you don't receive a letter, please call our office to schedule the follow-up appointment.   Your physician has requested that you have a carotid duplex. This test is an ultrasound of the carotid arteries in your neck. It looks at blood flow through these arteries that supply the brain with blood. Allow one hour for this exam. There are no restrictions or special instructions.   Your physician recommends that you return for lab work WITH CAROTID DOPPLERS=DO NOT EAT PRIOR TO LAB WORK

## 2013-06-20 NOTE — Assessment & Plan Note (Signed)
Patient counseled on discontinuing. 

## 2013-06-20 NOTE — Assessment & Plan Note (Signed)
Continue aspirin and statin. 

## 2013-06-20 NOTE — Assessment & Plan Note (Signed)
Continue aspirin and statin. Schedule followup carotid Dopplers. 

## 2013-06-20 NOTE — Assessment & Plan Note (Signed)
Continue statin. Check lipids and liver. 

## 2013-06-20 NOTE — Progress Notes (Signed)
   HPI: Pleasant gentleman for fu coronary disease. An abdominal ultrasound in March of 2006 showed no aneurysm. The patient underwent coronary artery bypass and graft in [place] in February of 2010 (LIMA to the LAD, saphenous vein graft to the circumflex, and free RIMA to the ramus). Note an echocardiogram in February of 2010 showed an ejection fraction of 50-55%. He also has a history of cerebrovascular disease. Carotid Dopplers in Nov 2011 showed a 40-59 left and 0-39 right internal carotid artery stenosis. He was last seen in Dec 2012. Since then, the patient denies any dyspnea on exertion, orthopnea, PND, pedal edema, palpitations, syncope or chest pain.   Current Outpatient Prescriptions  Medication Sig Dispense Refill  . aspirin (BAYER ASPIRIN) 81 MG tablet Take 1 tablet (81 mg total) by mouth daily.      Marland Kitchen atorvastatin (LIPITOR) 80 MG tablet TAKE ONE TABLET BY MOUTH EVERY DAY  90 tablet  3  . metoprolol (LOPRESSOR) 50 MG tablet TAKE ONE AND ONE-HALF TABLETS BY MOUTH TWICE DAILY  180 tablet  0   No current facility-administered medications for this visit.     Past Medical History  Diagnosis Date  . Acute myocardial infarction, unspecified site, episode of care unspecified 1985  . Coronary atherosclerosis of unspecified type of vessel, native or graft   . Other and unspecified hyperlipidemia   . Cerebrovascular disease, unspecified   . Renal hematoma 2005    Past Surgical History  Procedure Laterality Date  . Hemorrhoid surgery    . Coronary artery bypass graft      S/P in February of 2010. Procedure performed in [place]    History   Social History  . Marital Status: Married    Spouse Name: N/A    Number of Children: N/A  . Years of Education: N/A   Occupational History  . Full Time    Social History Main Topics  . Smoking status: Former Smoker -- 1.00 packs/day for 50 years    Types: Cigarettes    Quit date: 10/21/2008  . Smokeless tobacco: Not on file   Comment: Now smoking cigars  . Alcohol Use: Yes  . Drug Use: No  . Sexual Activity: Not on file   Other Topics Concern  . Not on file   Social History Narrative   No Regular Exercise    ROS: no fevers or chills, productive cough, hemoptysis, dysphasia, odynophagia, melena, hematochezia, dysuria, hematuria, rash, seizure activity, orthopnea, PND, pedal edema, claudication. Remaining systems are negative.  Physical Exam: Well-developed well-nourished in no acute distress.  Skin is warm and dry.  HEENT is normal.  Neck is supple.  Chest is clear to auscultation with normal expansion.  Cardiovascular exam is regular rate and rhythm.  Abdominal exam nontender or distended. No masses palpated. Extremities show no edema. neuro grossly intact  ECG sinus rhythm at a rate of 76. RV conduction delay. Left ventricular hypertrophy. Prior inferior infarct.

## 2013-06-28 ENCOUNTER — Other Ambulatory Visit (INDEPENDENT_AMBULATORY_CARE_PROVIDER_SITE_OTHER): Payer: Medicare Other

## 2013-06-28 ENCOUNTER — Ambulatory Visit (HOSPITAL_COMMUNITY): Payer: Medicare Other | Attending: Cardiovascular Disease

## 2013-06-28 ENCOUNTER — Other Ambulatory Visit: Payer: Medicare Other

## 2013-06-28 DIAGNOSIS — I6529 Occlusion and stenosis of unspecified carotid artery: Secondary | ICD-10-CM | POA: Insufficient documentation

## 2013-06-28 DIAGNOSIS — I2510 Atherosclerotic heart disease of native coronary artery without angina pectoris: Secondary | ICD-10-CM | POA: Insufficient documentation

## 2013-06-28 DIAGNOSIS — E785 Hyperlipidemia, unspecified: Secondary | ICD-10-CM

## 2013-06-28 DIAGNOSIS — I10 Essential (primary) hypertension: Secondary | ICD-10-CM | POA: Insufficient documentation

## 2013-06-28 DIAGNOSIS — I658 Occlusion and stenosis of other precerebral arteries: Secondary | ICD-10-CM | POA: Insufficient documentation

## 2013-06-28 DIAGNOSIS — Z951 Presence of aortocoronary bypass graft: Secondary | ICD-10-CM | POA: Insufficient documentation

## 2013-06-28 DIAGNOSIS — I679 Cerebrovascular disease, unspecified: Secondary | ICD-10-CM

## 2013-06-28 LAB — LIPID PANEL
Cholesterol: 114 mg/dL (ref 0–200)
HDL: 31.4 mg/dL — ABNORMAL LOW (ref >39.00)
LDL Cholesterol: 60 mg/dL (ref 0–99)
Triglycerides: 111 mg/dL (ref 0.0–149.0)

## 2013-06-28 LAB — HEPATIC FUNCTION PANEL
ALT: 15 U/L (ref 0–53)
AST: 23 U/L (ref 0–37)
Albumin: 3.7 g/dL (ref 3.5–5.2)
Alkaline Phosphatase: 74 U/L (ref 39–117)
Total Protein: 6.6 g/dL (ref 6.0–8.3)

## 2013-06-28 NOTE — Addendum Note (Signed)
Addended by: Williemae Area on: 06/28/2013 08:26 AM   Modules accepted: Orders

## 2013-06-28 NOTE — Addendum Note (Signed)
Addended by: Williemae Area on: 06/28/2013 10:08 AM   Modules accepted: Orders

## 2013-08-11 ENCOUNTER — Other Ambulatory Visit: Payer: Self-pay | Admitting: Cardiology

## 2013-09-24 ENCOUNTER — Other Ambulatory Visit: Payer: Self-pay | Admitting: *Deleted

## 2013-09-24 MED ORDER — ATORVASTATIN CALCIUM 80 MG PO TABS: ORAL_TABLET | ORAL | Status: DC

## 2013-09-24 MED ORDER — METOPROLOL TARTRATE 50 MG PO TABS: ORAL_TABLET | ORAL | Status: DC

## 2014-04-09 ENCOUNTER — Other Ambulatory Visit: Payer: Self-pay | Admitting: Cardiology

## 2014-06-10 ENCOUNTER — Other Ambulatory Visit: Payer: Self-pay | Admitting: Cardiology

## 2014-06-11 ENCOUNTER — Other Ambulatory Visit: Payer: Self-pay | Admitting: *Deleted

## 2014-06-11 MED ORDER — ATORVASTATIN CALCIUM 80 MG PO TABS: ORAL_TABLET | ORAL | Status: DC

## 2014-07-10 ENCOUNTER — Other Ambulatory Visit (HOSPITAL_BASED_OUTPATIENT_CLINIC_OR_DEPARTMENT_OTHER): Payer: Commercial Managed Care - HMO

## 2014-07-10 ENCOUNTER — Ambulatory Visit (INDEPENDENT_AMBULATORY_CARE_PROVIDER_SITE_OTHER): Payer: Medicare HMO | Admitting: Cardiology

## 2014-07-10 ENCOUNTER — Encounter: Payer: Self-pay | Admitting: Cardiology

## 2014-07-10 VITALS — BP 130/70 | HR 69 | Ht 67.0 in | Wt 203.0 lb

## 2014-07-10 DIAGNOSIS — I679 Cerebrovascular disease, unspecified: Secondary | ICD-10-CM

## 2014-07-10 DIAGNOSIS — I2510 Atherosclerotic heart disease of native coronary artery without angina pectoris: Secondary | ICD-10-CM

## 2014-07-10 DIAGNOSIS — I25810 Atherosclerosis of coronary artery bypass graft(s) without angina pectoris: Secondary | ICD-10-CM

## 2014-07-10 MED ORDER — METOPROLOL TARTRATE 50 MG PO TABS: ORAL_TABLET | ORAL | Status: DC

## 2014-07-10 MED ORDER — ATORVASTATIN CALCIUM 80 MG PO TABS: ORAL_TABLET | ORAL | Status: DC

## 2014-07-10 NOTE — Assessment & Plan Note (Signed)
Patient counseled on discontinuing. 

## 2014-07-10 NOTE — Assessment & Plan Note (Signed)
Continue aspirin and statin. Schedule followup carotid Dopplers. 

## 2014-07-10 NOTE — Progress Notes (Signed)
      HPI: FU coronary disease. An abdominal ultrasound in March of 2006 showed no aneurysm. The patient underwent coronary artery bypass and graft in [place] in February of 2010 (LIMA to the LAD, saphenous vein graft to the circumflex, and free RIMA to the ramus). Note an echocardiogram in February of 2010 showed an ejection fraction of 50-55%. He also has a history of cerebrovascular disease. Carotid Dopplers in Oct 2014 showed a 40-59 left and 0-39 right internal carotid artery stenosis; fu one year. Since he was last seen, the patient denies any dyspnea on exertion, orthopnea, PND, pedal edema, palpitations, syncope or chest pain.    Current Outpatient Prescriptions  Medication Sig Dispense Refill  . aspirin (BAYER ASPIRIN) 81 MG tablet Take 1 tablet (81 mg total) by mouth daily.      Marland Kitchen. atorvastatin (LIPITOR) 80 MG tablet TAKE 1 TABLET EVERY DAY  90 tablet  0  . metoprolol (LOPRESSOR) 50 MG tablet TAKE 1 AND 1/2 TABLETS TWICE DAILY (NEED AN APPOINTMENT IN OCTOBER 2015)  270 tablet  0   No current facility-administered medications for this visit.     Past Medical History  Diagnosis Date  . Acute myocardial infarction, unspecified site, episode of care unspecified 1985  . Coronary atherosclerosis of unspecified type of vessel, native or graft   . Other and unspecified hyperlipidemia   . Cerebrovascular disease, unspecified   . Renal hematoma 2005    Past Surgical History  Procedure Laterality Date  . Hemorrhoid surgery    . Coronary artery bypass graft      S/P in February of 2010. Procedure performed in [place]    History   Social History  . Marital Status: Married    Spouse Name: N/A    Number of Children: N/A  . Years of Education: N/A   Occupational History  . Full Time    Social History Main Topics  . Smoking status: Former Smoker -- 1.00 packs/day for 50 years    Types: Cigarettes    Quit date: 10/21/2008  . Smokeless tobacco: Not on file     Comment:  Now smoking cigars  . Alcohol Use: Yes  . Drug Use: No  . Sexual Activity: Not on file   Other Topics Concern  . Not on file   Social History Narrative   No Regular Exercise    ROS: Some back pain but no fevers or chills, productive cough, hemoptysis, dysphasia, odynophagia, melena, hematochezia, dysuria, hematuria, rash, seizure activity, orthopnea, PND, pedal edema, claudication. Remaining systems are negative.  Physical Exam: Well-developed well-nourished in no acute distress.  Skin is warm and dry.  HEENT is normal.  Neck is supple.  Chest is clear to auscultation with normal expansion.  Cardiovascular exam is regular rate and rhythm.  Abdominal exam nontender or distended. No masses palpated. Extremities show no edema. neuro grossly intact  ECG Sinus rhythm at a rate of 69. 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

## 2014-07-10 NOTE — Assessment & Plan Note (Signed)
Continue aspirin and statin. 

## 2014-07-10 NOTE — Assessment & Plan Note (Signed)
Continue statin. Check lipids and liver. 

## 2014-07-10 NOTE — Patient Instructions (Signed)
Your physician wants you to follow-up in: ONE YEAR WITH DR CRENSHAW You will receive a reminder letter in the mail two months in advance. If you don't receive a letter, please call our office to schedule the follow-up appointment.   Your physician has requested that you have a carotid duplex. This test is an ultrasound of the carotid arteries in your neck. It looks at blood flow through these arteries that supply the brain with blood. Allow one hour for this exam. There are no restrictions or special instructions.   Your physician recommends that you HAVE LAB WORK TODAY 

## 2014-07-11 LAB — LIPID PANEL
Cholesterol: 143 mg/dL (ref 0–200)
HDL: 36 mg/dL — AB (ref >39)
LDL CALC: 74 mg/dL (ref 0–99)
Total CHOL/HDL Ratio: 4 Ratio
Triglycerides: 167 mg/dL — ABNORMAL HIGH (ref <150)
VLDL: 33 mg/dL (ref 0–40)

## 2014-07-11 LAB — HEPATIC FUNCTION PANEL
ALBUMIN: 3.9 g/dL (ref 3.5–5.2)
ALT: 18 U/L (ref 0–53)
AST: 20 U/L (ref 0–37)
Alkaline Phosphatase: 87 U/L (ref 39–117)
Bilirubin, Direct: 0.2 mg/dL (ref 0.0–0.3)
Indirect Bilirubin: 0.6 mg/dL (ref 0.2–1.2)
TOTAL PROTEIN: 6.6 g/dL (ref 6.0–8.3)
Total Bilirubin: 0.8 mg/dL (ref 0.2–1.2)

## 2014-07-16 ENCOUNTER — Encounter: Payer: Self-pay | Admitting: *Deleted

## 2014-07-24 ENCOUNTER — Ambulatory Visit (HOSPITAL_BASED_OUTPATIENT_CLINIC_OR_DEPARTMENT_OTHER)
Admission: RE | Admit: 2014-07-24 | Discharge: 2014-07-24 | Disposition: A | Discharge status: Home / Self Care | Payer: Medicare HMO | Source: Ambulatory Visit | Attending: Diagnostic Radiology | Admitting: Diagnostic Radiology

## 2014-07-24 DIAGNOSIS — I679 Cerebrovascular disease, unspecified: Secondary | ICD-10-CM | POA: Insufficient documentation

## 2014-07-24 DIAGNOSIS — I6523 Occlusion and stenosis of bilateral carotid arteries: Secondary | ICD-10-CM | POA: Diagnosis not present

## 2014-07-24 DIAGNOSIS — I6529 Occlusion and stenosis of unspecified carotid artery: Secondary | ICD-10-CM | POA: Diagnosis present

## 2014-07-25 ENCOUNTER — Encounter: Payer: Self-pay | Admitting: Cardiology

## 2014-07-25 NOTE — Telephone Encounter (Signed)
This encounter was created in error - please disregard.

## 2014-07-25 NOTE — Telephone Encounter (Signed)
New message      Want carotid resuts

## 2015-06-09 ENCOUNTER — Other Ambulatory Visit: Payer: Self-pay | Admitting: Cardiology

## 2015-08-11 ENCOUNTER — Other Ambulatory Visit: Payer: Self-pay | Admitting: Cardiology

## 2015-10-08 ENCOUNTER — Other Ambulatory Visit: Payer: Self-pay | Admitting: Cardiology

## 2015-12-16 NOTE — Progress Notes (Signed)
      HPI: FU coronary disease. An abdominal ultrasound in March of 2006 showed no aneurysm. The patient underwent coronary artery bypass and graft in [place] in February of 2010 (LIMA to the LAD, saphenous vein graft to the circumflex, and free RIMA to the ramus). Note an echocardiogram in February of 2010 showed an ejection fraction of 50-55%. He also has a history of cerebrovascular disease. Carotid Dopplers in November 2015 showed plaque but no significant stenosis bilaterally. Since he was last seen, the patient denies any dyspnea on exertion, orthopnea, PND, pedal edema, palpitations, syncope or chest pain.   Current Outpatient Prescriptions  Medication Sig Dispense Refill  . aspirin (BAYER ASPIRIN) 81 MG tablet Take 1 tablet (81 mg total) by mouth daily.    Marland Kitchen. atorvastatin (LIPITOR) 80 MG tablet TAKE 1 TABLET EVERY DAY (PLEASE CALL MD OFFICE TO SCHEDULE AN APPOINTMENT [phone]) 90 tablet 0  . metoprolol (LOPRESSOR) 50 MG tablet TAKE 1 AND 1/2 TABLETS TWICE DAILY (NEED MD APPOINTMENT) 270 tablet 0  . omeprazole (PRILOSEC) 10 MG capsule Take 10 mg by mouth daily.      No current facility-administered medications for this visit.     Past Medical History  Diagnosis Date  . Acute myocardial infarction, unspecified site, episode of care unspecified 1985  . Coronary atherosclerosis of unspecified type of vessel, native or graft   . Other and unspecified hyperlipidemia   . Cerebrovascular disease, unspecified   . Renal hematoma 2005    Past Surgical History  Procedure Laterality Date  . Hemorrhoid surgery    . Coronary artery bypass graft      S/P in February of 2010. Procedure performed in [place]    Social History   Social History  . Marital Status: Married    Spouse Name: N/A  . Number of Children: N/A  . Years of Education: N/A   Occupational History  . Full Time    Social History Main Topics  . Smoking status: Former Smoker -- 1.00 packs/day for 50 years    Types: Cigarettes    Quit date: 10/21/2008  . Smokeless tobacco: Not on file     Comment: Now smoking cigars  . Alcohol Use: Yes  . Drug Use: No  . Sexual Activity: Not on file   Other Topics Concern  . Not on file   Social History Narrative   No Regular Exercise    Family History  Problem Relation Age of Onset  . Cancer Mother [id number]    Leukemia  . Coronary artery disease Father 4058    MI    ROS: no fevers or chills, productive cough, hemoptysis, dysphasia, odynophagia, melena, hematochezia, dysuria, hematuria, rash, seizure activity, orthopnea, PND, pedal edema, claudication. Remaining systems are negative.  Physical Exam: Well-developed well-nourished in no acute distress.  Skin is warm and dry.  HEENT is normal.  Neck is supple.  Chest is clear to auscultation with normal expansion.  Cardiovascular exam is regular rate and rhythm.  Abdominal exam nontender or distended. No masses palpated. Extremities show no edema. neuro grossly intact  ECG Sinus rhythm at a rate of 83. Inferior infarct. Nonspecific ST changes.

## 2015-12-17 ENCOUNTER — Encounter: Payer: Self-pay | Admitting: Cardiology

## 2015-12-17 ENCOUNTER — Ambulatory Visit (INDEPENDENT_AMBULATORY_CARE_PROVIDER_SITE_OTHER): Payer: Medicare HMO | Admitting: Cardiology

## 2015-12-17 VITALS — BP 157/90 | HR 86 | Ht 67.0 in | Wt 211.0 lb

## 2015-12-17 DIAGNOSIS — I679 Cerebrovascular disease, unspecified: Secondary | ICD-10-CM | POA: Diagnosis not present

## 2015-12-17 DIAGNOSIS — I2510 Atherosclerotic heart disease of native coronary artery without angina pectoris: Secondary | ICD-10-CM

## 2015-12-17 DIAGNOSIS — F17200 Nicotine dependence, unspecified, uncomplicated: Secondary | ICD-10-CM

## 2015-12-17 LAB — LIPID PANEL
CHOL/HDL RATIO: 4.4 ratio (ref <=5.0)
CHOLESTEROL: 141 mg/dL (ref 125–200)
HDL: 32 mg/dL — AB (ref >=40)
LDL Cholesterol: 83 mg/dL (ref <130)
Triglycerides: 130 mg/dL (ref <150)
VLDL: 26 mg/dL (ref <30)

## 2015-12-17 LAB — HEPATIC FUNCTION PANEL
ALBUMIN: 3.8 g/dL (ref 3.6–5.1)
ALT: 18 U/L (ref 9–46)
AST: 20 U/L (ref 10–35)
Alkaline Phosphatase: 87 U/L (ref 40–115)
BILIRUBIN DIRECT: 0.1 mg/dL (ref <=0.2)
Indirect Bilirubin: 0.6 mg/dL (ref 0.2–1.2)
TOTAL PROTEIN: 6.4 g/dL (ref 6.1–8.1)
Total Bilirubin: 0.7 mg/dL (ref 0.2–1.2)

## 2015-12-17 MED ORDER — METOPROLOL TARTRATE 50 MG PO TABS: 50.0000 mg | ORAL_TABLET | Freq: Two times a day (BID) | ORAL | Status: DC

## 2015-12-17 MED ORDER — ATORVASTATIN CALCIUM 80 MG PO TABS: 80.0000 mg | ORAL_TABLET | Freq: Every day | ORAL | Status: DC

## 2015-12-17 NOTE — Assessment & Plan Note (Signed)
Patient counseled on discontinuing. 

## 2015-12-17 NOTE — Assessment & Plan Note (Signed)
Continue Statin. Check lipids and liver.

## 2015-12-17 NOTE — Patient Instructions (Signed)
Medication Instructions:   NO CHANGE  Labwork:  Your physician recommends that you return for lab work WHEN ABLE  Testing/Procedures:  Your physician has requested that you have a carotid duplex. This test is an ultrasound of the carotid arteries in your neck. It looks at blood flow through these arteries that supply the brain with blood. Allow one hour for this exam. There are no restrictions or special instructions.DUE IN November= [id number]= SCHEDULE AFTER November 4TH    Follow-Up:  Your physician wants you to follow-up in: ONE YEAR WITH DR Shelda PalRENSHAW You will receive a reminder letter in the mail two months in advance. If you don't receive a letter, please call our office to schedule the follow-up appointment.

## 2015-12-17 NOTE — Assessment & Plan Note (Addendum)
Continue aspirin and statin. We'll likely plan exerciseNuclear study when he returns in 1 year.

## 2015-12-17 NOTE — Assessment & Plan Note (Addendum)
Continue aspirin and statin. No significant obstruction on most recent carotid Dopplers. However he has a left carotid bruit. Repeat study November 2017.

## 2016-07-21 ENCOUNTER — Telehealth: Payer: Self-pay | Admitting: *Deleted

## 2016-07-21 NOTE — Telephone Encounter (Signed)
Left message for pt to call, it is time to schedule his carotid dopplers.

## 2016-07-22 NOTE — Telephone Encounter (Signed)
Spoke with pt wife, telephone number to the [facility] imaging department given to the patient for them to call and schedule follow up dopplers.

## 2016-07-27 ENCOUNTER — Ambulatory Visit (HOSPITAL_BASED_OUTPATIENT_CLINIC_OR_DEPARTMENT_OTHER)
Admission: RE | Admit: 2016-07-27 | Discharge: 2016-07-27 | Disposition: A | Discharge status: Home / Self Care | Payer: Medicare HMO | Source: Ambulatory Visit | Attending: Cardiology | Admitting: Cardiology

## 2016-07-27 DIAGNOSIS — I679 Cerebrovascular disease, unspecified: Secondary | ICD-10-CM | POA: Diagnosis present

## 2017-01-25 ENCOUNTER — Other Ambulatory Visit: Payer: Self-pay | Admitting: Cardiology

## 2017-01-26 NOTE — Telephone Encounter (Signed)
REFILL 

## 2017-03-07 ENCOUNTER — Telehealth: Payer: Self-pay | Admitting: Cardiology

## 2017-03-07 DIAGNOSIS — E782 Mixed hyperlipidemia: Secondary | ICD-10-CM

## 2017-03-07 NOTE — Telephone Encounter (Signed)
Left message for pt wife, Lab orders mailed to the pt. He can go to the 3rd floor in the [facility] to have lab work done.

## 2017-03-07 NOTE — Telephone Encounter (Signed)
New Message  Pt wife call requesting to speak with RN about getting pt lab orders for 8/1 when he comes to see Dr. Jens Somrenshaw. Please call back to discuss

## 2017-03-07 NOTE — Telephone Encounter (Signed)
Lipids/liver, [facility]

## 2017-04-01 ENCOUNTER — Other Ambulatory Visit: Payer: Self-pay | Admitting: Cardiology

## 2017-04-06 NOTE — Progress Notes (Signed)
HPI: FU coronary disease. The patient underwent coronary artery bypass and graft in [place] in February of 2010 (LIMA to the LAD, saphenous vein graft to the circumflex, and free RIMA to the ramus). Note an echocardiogram in February of 2010 showed an ejection fraction of 50-55%. He also has a history of cerebrovascular disease. Carotid Dopplers November 2017 showed 50-69% left and less than 50% right stenosis. Since he was last seen, patient has an occasional "knot" in his chest that worsens with certain movements. Otherwise denies dyspnea, palpitations or syncope.    Current Outpatient Prescriptions  Medication Sig Dispense Refill  . aspirin (BAYER ASPIRIN) 81 MG tablet Take 1 tablet (81 mg total) by mouth daily.    Marland Kitchen atorvastatin (LIPITOR) 80 MG tablet TAKE 1 TABLET DAILY AT 6 PM (NEED OFFICE VISIT) 90 tablet 0  . metoprolol tartrate (LOPRESSOR) 50 MG tablet TAKE 1 TABLET TWICE DAILY 180 tablet 0  . omeprazole (PRILOSEC) 10 MG capsule Take 10 mg by mouth daily.      No current facility-administered medications for this visit.      Past Medical History:  Diagnosis Date  . Acute myocardial infarction, unspecified site, episode of care unspecified 1985  . Cerebrovascular disease, unspecified   . Coronary atherosclerosis of unspecified type of vessel, native or graft   . Other and unspecified hyperlipidemia   . Renal hematoma 2005    Past Surgical History:  Procedure Laterality Date  . CORONARY ARTERY BYPASS GRAFT     S/P in February of 2010. Procedure performed in [place]  . HEMORRHOID SURGERY      Social History   Social History  . Marital status: Married    Spouse name: N/A  . Number of children: N/A  . Years of education: N/A   Occupational History  . Full Time    Social History Main Topics  . Smoking status: Current Every Day Smoker    Packs/day: 1.00    Years: 50.00    Types: Cigars    Last attempt to quit: 10/21/2008  . Smokeless tobacco: Never Used      Comment: Now smoking cigars  . Alcohol use Yes  . Drug use: No  . Sexual activity: Not on file   Other Topics Concern  . Not on file   Social History Narrative   No Regular Exercise    Family History  Problem Relation Age of Onset  . Cancer Mother 15       Leukemia  . Coronary artery disease Father 81       MI    ROS: no fevers or chills, productive cough, hemoptysis, dysphasia, odynophagia, melena, hematochezia, dysuria, hematuria, rash, seizure activity, orthopnea, PND, pedal edema, claudication. Remaining systems are negative.  Physical Exam: Well-developed well-nourished in no acute distress.  Skin is warm and dry.  HEENT is normal.  Neck is supple.  Chest is clear to auscultation with normal expansion.  Cardiovascular exam is regular rate and rhythm.  Abdominal exam nontender or distended. No masses palpated. Positive bruit Extremities show no edema. neuro grossly intact  ECG- Sinus rhythm at a rate of 78, cannot rule out prior inferior infarct. personally reviewed  A/P  1 coronary artery disease-continue aspirin and statin. Occasional vague atypical chest pain. Schedule nuclear study for risk stratification.  2 carotid artery disease-patient will need repeat carotid Dopplers November 2018. Continue aspirin and statin.  3 tobacco abuse-patient counseled on discontinuing.   4 hyperlipidemia-continue statin. Check lipids and  liver.   5 bruit-schedule ultrasound to exclude aneurysm.     Olga MillersBrian Mohamedamin Nifong, MD

## 2017-04-20 ENCOUNTER — Ambulatory Visit (INDEPENDENT_AMBULATORY_CARE_PROVIDER_SITE_OTHER): Payer: Medicare HMO | Admitting: Cardiology

## 2017-04-20 ENCOUNTER — Encounter: Payer: Self-pay | Admitting: Cardiology

## 2017-04-20 VITALS — BP 128/88 | HR 78 | Ht 67.0 in | Wt 211.8 lb

## 2017-04-20 DIAGNOSIS — R0989 Other specified symptoms and signs involving the circulatory and respiratory systems: Secondary | ICD-10-CM

## 2017-04-20 DIAGNOSIS — E7800 Pure hypercholesterolemia, unspecified: Secondary | ICD-10-CM

## 2017-04-20 DIAGNOSIS — I2510 Atherosclerotic heart disease of native coronary artery without angina pectoris: Secondary | ICD-10-CM

## 2017-04-20 DIAGNOSIS — I679 Cerebrovascular disease, unspecified: Secondary | ICD-10-CM

## 2017-04-20 NOTE — Patient Instructions (Signed)
Medication Instructions:   NO CHANGE  Testing/Procedures:  Your physician has requested that you have a lexiscan myoview. For further information please visit [email or web address]. Please follow instruction sheet, as given.   Your physician has requested that you have an abdominal aorta duplex. During this test, an ultrasound is used to evaluate the aorta. Allow 30 minutes for this exam. Do not eat after midnight the day before and avoid carbonated beverages   Follow-Up:  Your physician wants you to follow-up in: ONE YEAR WITH DR CRENSHAW You will receive a reminder letter in the mail two months in advance. If you don't receive a letter, please call our office to schedule the follow-up appointment.   If you need a refill on your cardiac medications before your next appointment, please call your pharmacy.    

## 2017-04-21 ENCOUNTER — Encounter: Payer: Self-pay | Admitting: *Deleted

## 2017-04-21 LAB — LIPID PANEL
CHOL/HDL RATIO: 4.1 ratio (ref 0.0–5.0)
CHOLESTEROL TOTAL: 138 mg/dL (ref 100–199)
HDL: 34 mg/dL — ABNORMAL LOW (ref >39)
LDL CALC: 78 mg/dL (ref 0–99)
TRIGLYCERIDES: 129 mg/dL (ref 0–149)
VLDL CHOLESTEROL CAL: 26 mg/dL (ref 5–40)

## 2017-04-21 LAB — COMPREHENSIVE METABOLIC PANEL
ALT: 17 IU/L (ref 0–44)
AST: 24 IU/L (ref 0–40)
Albumin/Globulin Ratio: 1.6 (ref 1.2–2.2)
Albumin: 4 g/dL (ref 3.5–4.8)
Alkaline Phosphatase: 88 IU/L (ref 39–117)
BILIRUBIN TOTAL: 0.6 mg/dL (ref 0.0–1.2)
BUN/Creatinine Ratio: 7 — ABNORMAL LOW (ref 10–24)
BUN: 9 mg/dL (ref 8–27)
CHLORIDE: 104 mmol/L (ref 96–106)
CO2: 24 mmol/L (ref 20–29)
CREATININE: 1.32 mg/dL — AB (ref 0.76–1.27)
Calcium: 9.5 mg/dL (ref 8.6–10.2)
GFR calc Af Amer: 61 mL/min/{1.73_m2} (ref >59)
GFR calc non Af Amer: 53 mL/min/{1.73_m2} — ABNORMAL LOW (ref >59)
GLUCOSE: 96 mg/dL (ref 65–99)
Globulin, Total: 2.5 g/dL (ref 1.5–4.5)
Potassium: 5 mmol/L (ref 3.5–5.2)
Sodium: 142 mmol/L (ref 134–144)
Total Protein: 6.5 g/dL (ref 6.0–8.5)

## 2017-04-28 ENCOUNTER — Telehealth: Payer: Self-pay | Admitting: Cardiology

## 2017-04-28 NOTE — Telephone Encounter (Signed)
F/u message ° °Pt returning RN call .please call back to discuss  °

## 2017-04-28 NOTE — Telephone Encounter (Signed)
lmtcb

## 2017-04-28 NOTE — Telephone Encounter (Signed)
Ethan Estrada is returning a call . Thanks

## 2017-04-28 NOTE — Telephone Encounter (Signed)
Patient wife calling, states that she would like patient's bloodwork results from 04-20-17 mailed to their home.

## 2017-04-28 NOTE — Telephone Encounter (Signed)
lmtcb  Looks like labs were already mailed to patient 1 week ago by Stanton Kidneyebra, RCharity fundraiser

## 2017-04-29 ENCOUNTER — Inpatient Hospital Stay (HOSPITAL_COMMUNITY): Admission: RE | Admit: 2017-04-29 | Payer: Medicare HMO | Source: Ambulatory Visit

## 2017-04-29 NOTE — Telephone Encounter (Signed)
Patient made aware that results have been mailed and instructed to call back next week if they are not received.

## 2017-05-05 ENCOUNTER — Ambulatory Visit (HOSPITAL_BASED_OUTPATIENT_CLINIC_OR_DEPARTMENT_OTHER)
Admission: RE | Admit: 2017-05-05 | Discharge: 2017-05-05 | Disposition: A | Discharge status: Home / Self Care | Payer: Medicare HMO | Source: Ambulatory Visit | Attending: Cardiology | Admitting: Cardiology

## 2017-05-05 ENCOUNTER — Ambulatory Visit (HOSPITAL_COMMUNITY)
Admission: RE | Admit: 2017-05-05 | Discharge: 2017-05-05 | Disposition: A | Discharge status: Home / Self Care | Payer: Medicare HMO | Source: Ambulatory Visit | Attending: Cardiology | Admitting: Cardiology

## 2017-05-05 DIAGNOSIS — I679 Cerebrovascular disease, unspecified: Secondary | ICD-10-CM | POA: Diagnosis not present

## 2017-05-05 DIAGNOSIS — Z6833 Body mass index (BMI) 33.0-33.9, adult: Secondary | ICD-10-CM | POA: Insufficient documentation

## 2017-05-05 DIAGNOSIS — R5383 Other fatigue: Secondary | ICD-10-CM | POA: Diagnosis not present

## 2017-05-05 DIAGNOSIS — E663 Overweight: Secondary | ICD-10-CM | POA: Insufficient documentation

## 2017-05-05 DIAGNOSIS — E785 Hyperlipidemia, unspecified: Secondary | ICD-10-CM | POA: Diagnosis not present

## 2017-05-05 DIAGNOSIS — Z951 Presence of aortocoronary bypass graft: Secondary | ICD-10-CM | POA: Diagnosis not present

## 2017-05-05 DIAGNOSIS — R079 Chest pain, unspecified: Secondary | ICD-10-CM | POA: Diagnosis not present

## 2017-05-05 DIAGNOSIS — I708 Atherosclerosis of other arteries: Secondary | ICD-10-CM | POA: Insufficient documentation

## 2017-05-05 DIAGNOSIS — I2510 Atherosclerotic heart disease of native coronary artery without angina pectoris: Secondary | ICD-10-CM | POA: Insufficient documentation

## 2017-05-05 DIAGNOSIS — R0989 Other specified symptoms and signs involving the circulatory and respiratory systems: Secondary | ICD-10-CM | POA: Insufficient documentation

## 2017-05-05 DIAGNOSIS — I10 Essential (primary) hypertension: Secondary | ICD-10-CM | POA: Insufficient documentation

## 2017-05-05 DIAGNOSIS — Z8249 Family history of ischemic heart disease and other diseases of the circulatory system: Secondary | ICD-10-CM | POA: Diagnosis not present

## 2017-05-05 DIAGNOSIS — F17210 Nicotine dependence, cigarettes, uncomplicated: Secondary | ICD-10-CM | POA: Insufficient documentation

## 2017-05-05 DIAGNOSIS — Z720 Tobacco use: Secondary | ICD-10-CM | POA: Diagnosis not present

## 2017-05-05 DIAGNOSIS — I252 Old myocardial infarction: Secondary | ICD-10-CM | POA: Insufficient documentation

## 2017-05-05 DIAGNOSIS — Z8489 Family history of other specified conditions: Secondary | ICD-10-CM | POA: Insufficient documentation

## 2017-05-05 DIAGNOSIS — R9439 Abnormal result of other cardiovascular function study: Secondary | ICD-10-CM | POA: Insufficient documentation

## 2017-05-05 DIAGNOSIS — I779 Disorder of arteries and arterioles, unspecified: Secondary | ICD-10-CM | POA: Insufficient documentation

## 2017-05-05 MED ORDER — TECHNETIUM TC 99M TETROFOSMIN IV KIT
10.2000 | PACK | Freq: Once | INTRAVENOUS | Status: AC | PRN
  Administered 2017-05-05: 10.2 via INTRAVENOUS
  Filled 2017-05-05: qty 11

## 2017-05-05 MED ORDER — REGADENOSON 0.4 MG/5ML IV SOLN
0.4000 mg | Freq: Once | INTRAVENOUS | Status: AC
  Administered 2017-05-05: 0.4 mg via INTRAVENOUS

## 2017-05-05 MED ORDER — TECHNETIUM TC 99M TETROFOSMIN IV KIT
32.3000 | PACK | Freq: Once | INTRAVENOUS | Status: AC | PRN
  Administered 2017-05-05: 32.3 via INTRAVENOUS
  Filled 2017-05-05: qty 33

## 2017-05-06 LAB — MYOCARDIAL PERFUSION IMAGING
CHL CUP NUCLEAR SDS: 1
CHL CUP NUCLEAR SSS: 12
LV dias vol: 90 mL (ref 62–150)
LV sys vol: 45 mL
NUC STRESS TID: 1.7
Peak HR: 100 {beats}/min
Rest HR: 71 {beats}/min
SRS: 11

## 2017-06-06 ENCOUNTER — Other Ambulatory Visit: Payer: Self-pay | Admitting: Cardiology

## 2017-06-07 NOTE — Telephone Encounter (Signed)
Rx(s) sent to pharmacy electronically.  

## 2017-07-11 ENCOUNTER — Other Ambulatory Visit: Payer: Self-pay | Admitting: *Deleted

## 2017-07-11 DIAGNOSIS — I679 Cerebrovascular disease, unspecified: Secondary | ICD-10-CM

## 2018-01-05 ENCOUNTER — Other Ambulatory Visit: Payer: Self-pay | Admitting: Cardiology

## 2018-03-14 ENCOUNTER — Other Ambulatory Visit: Payer: Self-pay | Admitting: Cardiology

## 2018-04-07 ENCOUNTER — Other Ambulatory Visit: Payer: Self-pay | Admitting: Cardiology

## 2018-04-07 NOTE — Telephone Encounter (Signed)
Rx request sent to pharmacy.  

## 2018-04-17 ENCOUNTER — Telehealth: Payer: Self-pay | Admitting: Cardiology

## 2018-04-17 MED ORDER — ATORVASTATIN CALCIUM 80 MG PO TABS
ORAL_TABLET | ORAL | 3 refills | Status: DC
Start: 2018-04-17 — End: 2018-05-31

## 2018-04-17 MED ORDER — METOPROLOL TARTRATE 50 MG PO TABS
ORAL_TABLET | ORAL | 3 refills | Status: DC
Start: 2018-04-17 — End: 2018-05-31

## 2018-04-17 NOTE — Telephone Encounter (Signed)
Patient's wife states he needs a refill of Metoprolol 50 mg tablet and atorvastatin 80 mg table [facility]. Please make sure it is for 90 day supply. Insurance will not pay for 30 day supply.

## 2018-04-17 NOTE — Telephone Encounter (Signed)
Refill sent to the pharmacy electronically.  

## 2018-05-29 NOTE — Progress Notes (Signed)
HPI: FU coronary disease. The patient underwent coronary artery bypass and graft in [place] in February of 2010 (LIMA to the LAD, saphenous vein graft to the circumflex, and free RIMA to the ramus). Note an echocardiogram in February of 2010 showed an ejection fraction of 50-55%. Carotid Dopplers November 2017 showed 50-69% left and less than 50% right stenosis.   Abdominal ultrasound August 2018 showed no aneurysm.  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  Nuclear study August 2018 showed ejection fraction 50%.  There was prior infarct but no ischemia.  Since he was last seen, the patient denies any dyspnea on exertion, orthopnea, PND, pedal edema, palpitations, syncope or chest pain.   Current Outpatient Medications  Medication Sig Dispense Refill  . aspirin (BAYER ASPIRIN) 81 MG tablet Take 1 tablet (81 mg total) by mouth daily.    Marland Kitchen atorvastatin (LIPITOR) 80 MG tablet TAKE 1 TABLET DAILY AT 6PM. 90 tablet 3  . metoprolol tartrate (LOPRESSOR) 50 MG tablet TAKE 1 TABLET TWO TIMES DAILY. 180 tablet 3  . omeprazole (PRILOSEC) 10 MG capsule Take 10 mg by mouth daily.      No current facility-administered medications for this visit.      Past Medical History:  Diagnosis Date  . Acute myocardial infarction, unspecified site, episode of care unspecified 1985  . Cerebrovascular disease, unspecified   . Coronary atherosclerosis of unspecified type of vessel, native or graft   . Other and unspecified hyperlipidemia   . Renal hematoma 2005    Past Surgical History:  Procedure Laterality Date  . CORONARY ARTERY BYPASS GRAFT     S/P in February of 2010. Procedure performed in [place]  . HEMORRHOID SURGERY      Social History   Socioeconomic History  . Marital status: Married    Spouse name: Not on file  . Number of children: Not on file  . Years of education: Not on file  . Highest education level: Not on file  Occupational History  . Occupation: Full Time    Social Needs  . Financial resource strain: Not on file  . Food insecurity:    Worry: Not on file    Inability: Not on file  . Transportation needs:    Medical: Not on file    Non-medical: Not on file  Tobacco Use  . Smoking status: Current Every Day Smoker    Packs/day: 1.00    Years: 50.00    Pack years: 50.00    Types: Cigars    Last attempt to quit: 10/21/2008    Years since quitting: 9.6  . Smokeless tobacco: Never Used  . Tobacco comment: Now smoking cigars  Substance and Sexual Activity  . Alcohol use: Yes  . Drug use: No  . Sexual activity: Not on file  Lifestyle  . Physical activity:    Days per week: Not on file    Minutes per session: Not on file  . Stress: Not on file  Relationships  . Social connections:    Talks on phone: Not on file    Gets together: Not on file    Attends religious service: Not on file    Active member of club or organization: Not on file    Attends meetings of clubs or organizations: Not on file    Relationship status: Not on file  . Intimate partner violence:    Fear of current or ex partner: Not on file    Emotionally abused: Not on  file    Physically abused: Not on file    Forced sexual activity: Not on file  Other Topics Concern  . Not on file  Social History Narrative   No Regular Exercise    Family History  Problem Relation Age of Onset  . Cancer Mother 54       Leukemia  . Coronary artery disease Father 107       MI    ROS: no fevers or chills, productive cough, hemoptysis, dysphasia, odynophagia, melena, hematochezia, dysuria, hematuria, rash, seizure activity, orthopnea, PND, pedal edema, claudication. Remaining systems are negative.  Physical Exam: Well-developed well-nourished in no acute distress.  Skin is warm and dry.  HEENT is normal.  Neck is supple.  Chest is clear to auscultation with normal expansion.  Cardiovascular exam is regular rate and rhythm.  Abdominal exam nontender or distended. No masses  palpated. Extremities show no edema. neuro grossly intact  ECG-normal sinus rhythm at a rate of 70.  Nonspecific ST changes.  First-degree AV block.  Personally reviewed  A/P  1 Coronary artery disease-patient doing well with no recurrent chest pain.  Continue medical therapy with aspirin and statin.  Last nuclear study low risk.  2 carotid artery disease-plan repeat carotid Dopplers.  Continue aspirin and statin.  3 hyperlipidemia-continue statin.  Check lipids and liver.  4 tobacco abuse-patient again counseled on discontinuing.   Olga Millers, MD

## 2018-05-31 ENCOUNTER — Ambulatory Visit: Payer: Medicare HMO | Admitting: Cardiology

## 2018-05-31 ENCOUNTER — Encounter: Payer: Self-pay | Admitting: Cardiology

## 2018-05-31 VITALS — BP 118/78 | HR 70 | Ht 67.0 in | Wt 207.8 lb

## 2018-05-31 DIAGNOSIS — Z79899 Other long term (current) drug therapy: Secondary | ICD-10-CM | POA: Diagnosis not present

## 2018-05-31 DIAGNOSIS — I679 Cerebrovascular disease, unspecified: Secondary | ICD-10-CM

## 2018-05-31 DIAGNOSIS — E7800 Pure hypercholesterolemia, unspecified: Secondary | ICD-10-CM

## 2018-05-31 DIAGNOSIS — I2510 Atherosclerotic heart disease of native coronary artery without angina pectoris: Secondary | ICD-10-CM

## 2018-05-31 MED ORDER — ATORVASTATIN CALCIUM 80 MG PO TABS
ORAL_TABLET | ORAL | 3 refills | Status: DC
Start: 2018-05-31 — End: 2019-04-09

## 2018-05-31 MED ORDER — METOPROLOL TARTRATE 50 MG PO TABS: ORAL_TABLET | ORAL | 3 refills | Status: DC

## 2018-05-31 NOTE — Patient Instructions (Signed)
Medication Instructions: Your physician recommends that you continue on your current medications as directed.    If you need a refill on your cardiac medications before your next appointment, please call your pharmacy.   Labwork: Your physician recommends that you return for lab work on the day of procedure (lipid, LFT)   Procedures/Testing: Your physician has requested that you have a carotid duplex. This test is an ultrasound of the carotid arteries in your neck. It looks at blood flow through these arteries that supply the brain with blood. Allow one hour for this exam. There are no restrictions or special instructions.   Follow-Up: Your physician wants you to follow-up in 1 year with Dr.Crenshaw You will receive a reminder letter in the mail two months in advance. If you don't receive a letter, please call our office at [phone] to schedule this follow-up appointment.   Special Instructions:    Thank you for choosing [facility] at [facility]!!

## 2018-05-31 NOTE — Addendum Note (Signed)
Addended by: Parke Poisson on: 05/31/2018 09:20 AM   Modules accepted: Orders

## 2018-06-01 LAB — HEPATIC FUNCTION PANEL
ALBUMIN: 4.2 g/dL (ref 3.5–4.8)
ALT: 14 IU/L (ref 0–44)
AST: 20 IU/L (ref 0–40)
Alkaline Phosphatase: 92 IU/L (ref 39–117)
BILIRUBIN, DIRECT: 0.18 mg/dL (ref 0.00–0.40)
Bilirubin Total: 0.7 mg/dL (ref 0.0–1.2)
TOTAL PROTEIN: 6.7 g/dL (ref 6.0–8.5)

## 2018-06-01 LAB — LIPID PANEL
CHOL/HDL RATIO: 4.1 ratio (ref 0.0–5.0)
Cholesterol, Total: 142 mg/dL (ref 100–199)
HDL: 35 mg/dL — AB (ref >39)
LDL Calculated: 82 mg/dL (ref 0–99)
Triglycerides: 127 mg/dL (ref 0–149)
VLDL Cholesterol Cal: 25 mg/dL (ref 5–40)

## 2018-07-03 ENCOUNTER — Ambulatory Visit (HOSPITAL_BASED_OUTPATIENT_CLINIC_OR_DEPARTMENT_OTHER): Payer: Medicare HMO

## 2018-07-05 ENCOUNTER — Ambulatory Visit (HOSPITAL_BASED_OUTPATIENT_CLINIC_OR_DEPARTMENT_OTHER)
Admission: RE | Admit: 2018-07-05 | Discharge: 2018-07-05 | Disposition: A | Discharge status: Home / Self Care | Payer: Medicare HMO | Source: Ambulatory Visit | Attending: Cardiology | Admitting: Cardiology

## 2018-07-05 DIAGNOSIS — I6523 Occlusion and stenosis of bilateral carotid arteries: Secondary | ICD-10-CM | POA: Insufficient documentation

## 2018-07-05 DIAGNOSIS — I679 Cerebrovascular disease, unspecified: Secondary | ICD-10-CM

## 2018-07-05 NOTE — Progress Notes (Signed)
Carotid Duplex performed      Right Carotid: Velocities in the right ICA are consistent with a 60-79% stenosis. Non-hemodynamically significant plaque <50% noted in the CCA. The ECA appears <50% stenosed.    Left Carotid: Velocities in the left ICA are consistent with a 80-99% stenosis. Non-hemodynamically significant plaque noted in the CCA. The ECA appears >50% stenosed   Vertebrals: Bilateral vertebral arteries demonstrate antegrade flow.    Subclavians: Normal flow hemodynamics were seen in bilateral subclavian arteries.      07/05/18 Ethan Estrada

## 2018-07-07 ENCOUNTER — Other Ambulatory Visit: Payer: Self-pay | Admitting: *Deleted

## 2018-07-07 DIAGNOSIS — I679 Cerebrovascular disease, unspecified: Secondary | ICD-10-CM

## 2018-07-10 ENCOUNTER — Other Ambulatory Visit: Payer: Self-pay

## 2018-07-10 DIAGNOSIS — I6523 Occlusion and stenosis of bilateral carotid arteries: Secondary | ICD-10-CM

## 2018-07-13 ENCOUNTER — Telehealth: Payer: Self-pay | Admitting: Cardiology

## 2018-07-13 NOTE — Telephone Encounter (Signed)
New Message         Patient's wife is calling today to speak with nurse about a "Vascular and Vein" doctor. Stanton Kidney) the nurse. Patient is aware that Stanton Kidney is off today.

## 2018-07-13 NOTE — Telephone Encounter (Signed)
Pts wife ([name] on [place]) called to give Dr. Darel Hong an [place] Childes that they are having to change PMD since he has not seen his last one since 2013 and they are not taking on nay new pts.Marland Kitchen.he was with [facility]. She found him a new PMD with [facility] .. Dr. Carmelia Roller on [address]. His appt is on 07/28/18 and she will talk with him re: referring them to Vascular at that visit.

## 2018-07-14 NOTE — Telephone Encounter (Signed)
Spoke with pt wife, will get referral at PCP due to insurance.

## 2018-07-24 ENCOUNTER — Telehealth: Payer: Self-pay | Admitting: Family Medicine

## 2018-07-24 NOTE — Telephone Encounter (Signed)
Copied from CRM [id number]. Topic: Appointment Scheduling - New Patient >> Jul 13, 2018  8:53 AM Trula Slade wrote: New patient has been scheduled for your office. Provider:  Arva Chafe Date of Appointment:  07/24/18  Route to department's PEC pool. >> Jul 24, 2018  9:22 AM Crist Infante wrote: Wife concerned pt has not received NPP.  Wants to know if this was sent.  Advised her to arrive 1 pm if she does not receive by Friday

## 2018-07-24 NOTE — Telephone Encounter (Signed)
Copied from CRM [id number]. Topic: Appointment Scheduling - New Patient >> Jul 13, 2018  8:53 AM Trula Slade wrote: New patient has been scheduled for your office. Provider:  Arva Chafe Date of Appointment:  07/24/18  Route to department's PEC pool. >> Jul 24, 2018  9:22 AM Crist Infante wrote: Wife concerned pt has not received NPP.  Wants to know if this was sent.  Advised her to arrive 1 pm if she does not receive by Friday >> Jul 24, 2018 10:54 AM Ninfa Meeker H wrote: We did not mail new patient packet considering the amount of time it would take to get to the patient through the mail. We will have patient fill out paperwork in the office.

## 2018-07-28 ENCOUNTER — Ambulatory Visit (INDEPENDENT_AMBULATORY_CARE_PROVIDER_SITE_OTHER): Payer: Medicare HMO | Admitting: Family Medicine

## 2018-07-28 ENCOUNTER — Encounter: Payer: Self-pay | Admitting: Family Medicine

## 2018-07-28 ENCOUNTER — Telehealth: Payer: Self-pay | Admitting: Family Medicine

## 2018-07-28 VITALS — BP 134/72 | HR 80 | Temp 98.5°F | Ht 67.0 in | Wt 210.4 lb

## 2018-07-28 DIAGNOSIS — I6522 Occlusion and stenosis of left carotid artery: Secondary | ICD-10-CM | POA: Diagnosis not present

## 2018-07-28 DIAGNOSIS — G4700 Insomnia, unspecified: Secondary | ICD-10-CM | POA: Diagnosis not present

## 2018-07-28 MED ORDER — TRAZODONE HCL 50 MG PO TABS: 25.0000 mg | ORAL_TABLET | Freq: Every evening | ORAL | 1 refills | Status: DC | PRN

## 2018-07-28 NOTE — Progress Notes (Signed)
Pre visit review using our clinic review tool, if applicable. No additional management support is needed unless otherwise documented below in the visit note. 

## 2018-07-28 NOTE — Progress Notes (Signed)
Chief Complaint  Patient presents with  . New Patient (Initial Visit)       New Patient Visit SUBJECTIVE: HPI: Ethan Estrada is an 72 y.o.male who is being seen for establishing care.  The patient was previously seen at only his cardiologist's office.  He is here with his wife.  Hx of carotid artery disease, referred to vasc surg was out of network and too expensive.   He is told he has between 80%- 99% blockage on the left and an unknown amount of blockage on the right.  He is taking aspirin and Lipitor.  Over the past 10 years since the patient retired, he has been having issues falling asleep and consistently staying asleep.  He goes to bed at sporadic hours.  He usually sleeps for around 6 hours total waking up frequently throughout the night.  He does not always feel well rested.  Patient does not wake up gasping for air.  He used to be prescribed Ambien by his cardiologist, but this was stopped several years ago.  He is tried NyQuil and melatonin over-the-counter without relief.  He drinks caffeine but not past noon.  He drinks alcohol sporadically.  He will frequently nap throughout the day.  He does snore, but does not stop breathing at night per his wife.  No Known Allergies  Past Medical History:  Diagnosis Date  . Acute myocardial infarction, unspecified site, episode of care unspecified 1985  . Cerebrovascular disease, unspecified   . Coronary atherosclerosis of unspecified type of vessel, native or graft   . Other and unspecified hyperlipidemia   . Renal hematoma 2005   Past Surgical History:  Procedure Laterality Date  . CORONARY ARTERY BYPASS GRAFT     S/P in February of 2010. Procedure [place] in [place]  . HEMORRHOID SURGERY     Family History  Problem Relation Age of Onset  . Cancer Mother 34       Leukemia  . Coronary artery disease Father 40       MI   No Known Allergies  Current Outpatient Medications:  .  aspirin (BAYER ASPIRIN) 81 MG tablet,  Take 1 tablet (81 mg total) by mouth daily., Disp: , Rfl:  .  atorvastatin (LIPITOR) 80 MG tablet, TAKE 1 TABLET DAILY AT 6PM., Disp: 90 tablet, Rfl: 3 .  metoprolol tartrate (LOPRESSOR) 50 MG tablet, TAKE 1 TABLET TWO TIMES DAILY., Disp: 180 tablet, Rfl: 3 .  omeprazole (PRILOSEC) 10 MG capsule, Take 10 mg by mouth daily. , Disp: , Rfl:  .  traZODone (DESYREL) 50 MG tablet, Take 0.5-1 tablets (25-50 mg total) by mouth at bedtime as needed for sleep., Disp: 90 tablet, Rfl: 1  ROS Cardiovascular: Denies chest pain  Respiratory: Denies dyspnea   OBJECTIVE: BP 134/72 (BP Location: Left Arm, Patient Position: Sitting, Cuff Size: Large)   Pulse 80   Temp 98.5 F (36.9 C) (Oral)   Ht 5\' 7"  (1.702 m)   Wt 210 lb 6 oz (95.4 kg)   SpO2 98%   BMI 32.95 kg/m   Constitutional: -  VS reviewed -  Well developed, well nourished, appears stated age -  No apparent distress  Psychiatric: -  Oriented to person, place, and time -  Memory intact -  Affect and mood normal -  Fluent conversation, good eye contact -  Judgment and insight age appropriate  Eye: -  Conjunctivae clear, no discharge -  Pupils symmetric, round, reactive to light  ENMT: -  MMM  Pharynx moist, no exudate, no erythema  Neck: -  No gross swelling, no palpable masses -  Thyroid midline, not enlarged, mobile, no palpable masses  Cardiovascular: -  RRR -  B/l bruits noted, louder on L -  No LE edema  Respiratory: -  Normal respiratory effort, no accessory muscle use, no retraction -  Breath sounds equal, no wheezes, no ronchi, no crackles  Neurological:  -  CN II - XII grossly intact -  Sensation grossly intact to light touch, equal bilaterally  Musculoskeletal: -  No clubbing, no cyanosis -  Gait normal  Skin: -  No significant lesion on inspection -  Warm and dry to palpation   ASSESSMENT/PLAN: Stenosis of left carotid artery - Plan: Ambulatory referral to Vascular Surgery  Insomnia, unspecified type - Plan:  traZODone (DESYREL) 50 MG tablet  Will refer to vascular surgery in network.  Continue aspirin and statin. Start trazodone as needed.  I truly believe he needs to work on his sleep hygiene.  Will stay away from Z drugs.  Sleep hygiene information discussed and provided in writing.  # for LB counseling/CBT given. Patient should return in 1 month. The patient voiced understanding and agreement to the plan.   Jilda Roche [place], DO 07/28/18  2:09 PM

## 2018-07-28 NOTE — Telephone Encounter (Signed)
Patients wife called back to [facility] and was unable to speak to the patients wife. Called her back left message to call us back

## 2018-07-28 NOTE — Telephone Encounter (Signed)
Spoke to Ethan Estrada//PCC and the patient.  Evidently this office has changed locations and has not been updated with [employer]. The patients wife will call them to let them know to update with [facility] their new location and then referral will be done.

## 2018-07-28 NOTE — Patient Instructions (Addendum)
If you do not hear anything about your referral in the next 1-2 weeks, call our office and ask for an update.  Stop smoking.  Sleep Hygiene Tips:  Do not watch TV or look at screens within 1 hour of going to bed. If you do, make sure there is a blue light filter (nighttime mode) involved.  Try to go to bed around the same time every night. Wake up at the same time within 1 hour of regular time. Ex: If you wake up at 7 AM for work, do not sleep past 8 AM on days that you don't work.  Do not drink alcohol before bedtime.  Do not consume caffeine-containing beverages after noon or within 9 hours of intended bedtime.  Get regular exercise/physical activity in your life, but not within 2 hours of planned bedtime.  Do not take naps.   Do not eat within 2 hours of planned bedtime.  The bed should be for sleep or sex only. If after 20-30 minutes you are unable to fall asleep, get up and do something relaxing. Do this until you feel ready to go to sleep again.   Sleep is important to [place] all. Getting good sleep is imperative to adequate functioning during the day. Work with our counselors who are trained to help people obtain quality sleep. Call [phone] to schedule an appointment or if you are curious about insurance coverage/cost.   Let us know if you need anything.

## 2018-07-28 NOTE — Telephone Encounter (Signed)
Copied from CRM [id number]. Topic: General - Other >> Jul 28, 2018  2:45 PM Marylen Ponto wrote: Reason for CRM: Pt wife Robbi Garter states she contacted their insurance company ands was advised that the referral for the vascular surgeon could not be approved because there is a problem with the address. [name] requests a return call. Cb# [phone]

## 2018-07-31 NOTE — Telephone Encounter (Signed)
Pt calling back and would like a call back. Please advise [phone]

## 2018-07-31 NOTE — Telephone Encounter (Signed)
Spoke with pt's wife referral has been changed to [facility], pt confirmed [facility] is in the network

## 2018-07-31 NOTE — Telephone Encounter (Signed)
Pt.'s wife calling requesting referral status update. Author consulted with Zella Ball, CMA, who said last she heard insurance would not over the referral, even with updated address. Routed to [place] and Selena Batten to attempt to resolve. PEC made wife aware that someone from the office would be returning call to wife today.

## 2018-08-02 ENCOUNTER — Telehealth: Payer: Self-pay | Admitting: Cardiology

## 2018-08-02 NOTE — Telephone Encounter (Signed)
Patient's spouse called to let you know they got appointment reinstated with Dr. Gretta Beganodd Early. She also states they were seeing Dr. Carmelia RollerWendling. Just a FYI.

## 2018-08-04 ENCOUNTER — Other Ambulatory Visit: Payer: Self-pay

## 2018-08-04 DIAGNOSIS — I6522 Occlusion and stenosis of left carotid artery: Secondary | ICD-10-CM

## 2018-08-08 ENCOUNTER — Telehealth: Payer: Self-pay | Admitting: Cardiology

## 2018-08-08 NOTE — Telephone Encounter (Signed)
Spoke with patient's wife [name]. She wanted to confirm Dr. Arbie CookeyEarly had access to patient's carotid u/s results prior to patient's new appt. On 12/24. No further questions at this time.

## 2018-08-08 NOTE — Telephone Encounter (Signed)
New Message:      Pt's spouse is calling and states that Dr. Bosie HelperEarly's office is asking for a copy of the last carotid the pt had in our office. She states they do not have access to this and would like us to send them a copy or provide the pt with a copy to be able to take to this appt. She states they said if they do not have this the appt with be cancelled. Please advise.

## 2018-08-08 NOTE — Telephone Encounter (Signed)
Spoke with Dr Bosie HelperEarly's office and they have everything they need for appointment. Advised wife, verbalized understanding.

## 2018-08-29 ENCOUNTER — Encounter (HOSPITAL_COMMUNITY): Payer: Medicare HMO

## 2018-08-29 ENCOUNTER — Encounter: Payer: Medicare HMO | Admitting: Vascular Surgery

## 2018-08-30 ENCOUNTER — Ambulatory Visit (INDEPENDENT_AMBULATORY_CARE_PROVIDER_SITE_OTHER): Payer: Medicare HMO | Admitting: Family Medicine

## 2018-08-30 ENCOUNTER — Encounter: Payer: Self-pay | Admitting: Family Medicine

## 2018-08-30 VITALS — BP 132/76 | HR 78 | Temp 98.7°F | Ht 66.0 in | Wt 213.2 lb

## 2018-08-30 DIAGNOSIS — G4700 Insomnia, unspecified: Secondary | ICD-10-CM | POA: Diagnosis not present

## 2018-08-30 NOTE — Progress Notes (Signed)
Chief Complaint  Patient presents with  . Follow-up    Subjective: Patient is a 72 y.o. male here for insomnia f/u.  Given sleep hygiene tips and started on Trazodone at last visit. Reports much improvement. Takes 2-3 times per week, full tab. Sleep hygiene has not changed much, but he feels good.    ROS: Psych: As noted in HPI  Past Medical History:  Diagnosis Date  . Acute myocardial infarction, unspecified site, episode of care unspecified 1985  . Cerebrovascular disease, unspecified   . Coronary atherosclerosis of unspecified type of vessel, native or graft   . Other and unspecified hyperlipidemia   . Renal hematoma 2005    Objective: BP 132/76 (BP Location: Left Arm, Patient Position: Sitting, Cuff Size: Large)   Pulse 78   Temp 98.7 F (37.1 C) (Oral)   Ht 5\' 6"  (1.676 m)   Wt 213 lb 4 oz (96.7 kg)   SpO2 98%   BMI 34.42 kg/m  General: Awake, appears stated age Heart: RRR, +bl carotid bruits, louder on L.  Lungs: CTAB, no rales, wheezes or rhonchi. No accessory muscle use Psych: Age appropriate judgment and insight, normal affect and mood  Assessment and Plan: Insomnia, unspecified type  Cont trazodone. F/u in 6 mo for CPE or prn.  The patient voiced understanding and agreement to the plan.  Jilda Rocheicholas Paul [place], DO 08/30/18  1:03 PM

## 2018-08-30 NOTE — Progress Notes (Signed)
Pre visit review using our clinic review tool, if applicable. No additional management support is needed unless otherwise documented below in the visit note. 

## 2018-08-30 NOTE — Patient Instructions (Addendum)
Keep up the good work.  Let us know if you need anything.  

## 2018-09-12 ENCOUNTER — Telehealth: Payer: Self-pay | Admitting: *Deleted

## 2018-09-12 ENCOUNTER — Encounter: Payer: Self-pay | Admitting: *Deleted

## 2018-09-12 ENCOUNTER — Ambulatory Visit (HOSPITAL_COMMUNITY)
Admission: RE | Admit: 2018-09-12 | Discharge: 2018-09-12 | Disposition: A | Discharge status: Home / Self Care | Payer: Medicare HMO | Source: Ambulatory Visit | Attending: Vascular Surgery | Admitting: Vascular Surgery

## 2018-09-12 ENCOUNTER — Encounter: Payer: Self-pay | Admitting: Vascular Surgery

## 2018-09-12 ENCOUNTER — Ambulatory Visit: Payer: Medicare HMO | Admitting: Vascular Surgery

## 2018-09-12 VITALS — BP 132/85 | HR 75 | Temp 97.7°F | Resp 14 | Ht 66.0 in | Wt 211.0 lb

## 2018-09-12 DIAGNOSIS — I6522 Occlusion and stenosis of left carotid artery: Secondary | ICD-10-CM

## 2018-09-12 NOTE — H&P (View-Only) (Signed)
Vascular and Vein Specialist of [facility]  Patient name: Ethan Estrada MRN: [id number] DOB: [birth year] Sex: male  REASON FOR CONSULT: Evaluation of asymptomatic left carotid stenosis  HPI: Ethan Estrada is a 72 y.o. male, who is here today for evaluation of left carotid stenosis.  He is here today with his wife.  He is a very pleasant right-handed gentleman who reports that he is had follow-up of carotid disease since the late 1990s.  He has had progression of this and recently had a [facility] showing progression to severe stenosis in the left internal carotid artery.  Fortunately he denies any prior history of amaurosis fugax, a aphasia, transient ischemic attack or stroke.  Does have significant coronary disease.  And undergone coronary angioplasty in the 1990s and subsequently underwent coronary artery bypass grafting.  Vanshika Jastrzebski 2000's.  He had a nuclear cardiac scan in August 2018 showing 50% ejection fraction and old scar but no evidence of ischemia.  He remains quite active.  He does have some lower extremity edema but no claudication type symptoms.  He is a long-term cigarette smoker and active cigarette smoker  Past Medical History:  Diagnosis Date  . Acute myocardial infarction, unspecified site, episode of care unspecified 1985  . CAD (coronary artery disease)   . Cerebrovascular disease, unspecified   . Coronary atherosclerosis of unspecified type of vessel, native or graft   . History of heart bypass surgery   . MI (myocardial infarction) (HCC)   . Other and unspecified hyperlipidemia   . Renal hematoma 2005    Family History  Problem Relation Age of Onset  . Cancer Mother 2       Leukemia  . Coronary artery disease Father 22       MI    SOCIAL HISTORY: Social History   Socioeconomic History  . Marital status: Married    Spouse name: Not on file  . Number of children: Not on file  . Years of education: Not on file  .  Highest education level: Not on file  Occupational History  . Occupation: Full Time  Social Needs  . Financial resource strain: Not on file  . Food insecurity:    Worry: Not on file    Inability: Not on file  . Transportation needs:    Medical: Not on file    Non-medical: Not on file  Tobacco Use  . Smoking status: Current Every Day Smoker    Packs/day: 1.00    Years: 50.00    Pack years: 50.00    Types: Cigars    Last attempt to quit: 10/21/2008    Years since quitting: 9.8  . Smokeless tobacco: Never Used  . Tobacco comment: Now smoking cigars  Substance and Sexual Activity  . Alcohol use: Yes  . Drug use: No  . Sexual activity: Not on file  Lifestyle  . Physical activity:    Days per week: Not on file    Minutes per session: Not on file  . Stress: Not on file  Relationships  . Social connections:    Talks on phone: Not on file    Gets together: Not on file    Attends religious service: Not on file    Active member of club or organization: Not on file    Attends meetings of clubs or organizations: Not on file    Relationship status: Not on file  . Intimate partner violence:    Fear of current or ex partner: Not  on file    Emotionally abused: Not on file    Physically abused: Not on file    Forced sexual activity: Not on file  Other Topics Concern  . Not on file  Social History Narrative   No Regular Exercise    No Known Allergies  Current Outpatient Medications  Medication Sig Dispense Refill  . aspirin (BAYER ASPIRIN) 81 MG tablet Take 1 tablet (81 mg total) by mouth daily.    Marland Kitchen. atorvastatin (LIPITOR) 80 MG tablet TAKE 1 TABLET DAILY AT 6PM. 90 tablet 3  . metoprolol tartrate (LOPRESSOR) 50 MG tablet TAKE 1 TABLET TWO TIMES DAILY. 180 tablet 3  . omeprazole (PRILOSEC) 10 MG capsule Take 10 mg by mouth daily.     . traZODone (DESYREL) 50 MG tablet Take 0.5-1 tablets (25-50 mg total) by mouth at bedtime as needed for sleep. 90 tablet 1   No current  facility-administered medications for this visit.     REVIEW OF SYSTEMS:  [X]  denotes positive finding, [ ]  denotes negative finding Cardiac  Comments:  Chest pain or chest pressure:    Shortness of breath upon exertion:    Short of breath when lying flat:    Irregular heart rhythm:        Vascular    Pain in calf, thigh, or hip brought on by ambulation:    Pain in feet at night that wakes you up from your sleep:     Blood clot in your veins:    Leg swelling:         Pulmonary    Oxygen at home:    Productive cough:  x   Wheezing:         Neurologic    Sudden weakness in arms or legs:     Sudden numbness in arms or legs:  x   Sudden onset of difficulty speaking or slurred speech:    Temporary loss of vision in one eye:     Problems with dizziness:         Gastrointestinal    Blood in stool:  x   Vomited blood:         Genitourinary    Burning when urinating:     Blood in urine:        Psychiatric    Major depression:         Hematologic    Bleeding problems:    Problems with blood clotting too easily:        Skin    Rashes or ulcers:        Constitutional    Fever or chills:      PHYSICAL EXAM: Vitals:   09/12/18 1357  BP: 132/85  Pulse: 75  Resp: 14  Temp: 97.7 F (36.5 C)  SpO2: 96%  Weight: 211 lb (95.7 kg)  Height: 5\' 6"  (1.676 m)    GENERAL: The patient is a well-nourished male, in no acute distress. The vital signs are documented above. CARDIOVASCULAR: He does have left carotid bruit.  I do not hear a right carotid bruit.  He has 2+ radial and 2+ posterior tibial pulses bilaterally. PULMONARY: There is good air exchange  ABDOMEN: Soft and non-tender.  No aneurysm palpable MUSCULOSKELETAL: There are no major deformities or cyanosis. NEUROLOGIC: No focal weakness or paresthesias are detected. SKIN: There are no ulcers or rashes noted. PSYCHIATRIC: The patient has a normal affect.  DATA:  I reviewed his study from [facility] and also repeat  study  today to determine if he was a candidate for surgery based on duplex alone.  This does confirm high-grade stenosis at the distal common carotid proximal internal carotid region of his left internal carotid artery.  End-diastolic velocities are 111 cm/s  MEDICAL ISSUES: Long discussion with the patient and his wife.  He is right-handed and this is left hemisphere involvement.  I explained that this would put him at approximately 5 %/year risk of neurologic deficit based on his high-grade asymptomatic disease.  I discussed the option of left carotid endarterectomy for reduction of stroke risk.  I explained the procedure with an expected one night hospitalization.  Also explained the 1 to 1-1/2% risk of stroke with surgery and a very slight risk of cranial nerve injury and infection and bleeding.  He understands and wishes to proceed with surgery.  We will schedule this at his earliest convenience.   Larina Earthlyodd F. Sol Odor, MD FACS [facility] Office Tel [phone] Pager [phone]

## 2018-09-12 NOTE — Progress Notes (Signed)
Vascular and Vein Specialist of [facility]  Patient name: Nazier Neyhart MRN: [id number] DOB: [birth year] Sex: male  REASON FOR CONSULT: Evaluation of asymptomatic left carotid stenosis  HPI: Bradely Rudin is a 72 y.o. male, who is here today for evaluation of left carotid stenosis.  He is here today with his wife.  He is a very pleasant right-handed gentleman who reports that he is had follow-up of carotid disease since the late 1990s.  He has had progression of this and recently had a study in [facility] showing progression to severe stenosis in the left internal carotid artery.  Fortunately he denies any prior history of amaurosis fugax, a aphasia, transient ischemic attack or stroke.  Does have significant coronary disease.  And undergone coronary angioplasty in the 1990s and subsequently underwent coronary artery bypass grafting.  Breiana Stratmann 2000's.  He had a nuclear cardiac scan in August 2018 showing 50% ejection fraction and old scar but no evidence of ischemia.  He remains quite active.  He does have some lower extremity edema but no claudication type symptoms.  He is a long-term cigarette smoker and active cigarette smoker  Past Medical History:  Diagnosis Date  . Acute myocardial infarction, unspecified site, episode of care unspecified 1985  . CAD (coronary artery disease)   . Cerebrovascular disease, unspecified   . Coronary atherosclerosis of unspecified type of vessel, native or graft   . History of heart bypass surgery   . MI (myocardial infarction) (HCC)   . Other and unspecified hyperlipidemia   . Renal hematoma 2005    Family History  Problem Relation Age of Onset  . Cancer Mother 53       Leukemia  . Coronary artery disease Father 63       MI    SOCIAL HISTORY: Social History   Socioeconomic History  . Marital status: Married    Spouse name: Not on file  . Number of children: Not on file  . Years of education: Not on file  .  Highest education level: Not on file  Occupational History  . Occupation: Full Time  Social Needs  . Financial resource strain: Not on file  . Food insecurity:    Worry: Not on file    Inability: Not on file  . Transportation needs:    Medical: Not on file    Non-medical: Not on file  Tobacco Use  . Smoking status: Current Every Day Smoker    Packs/day: 1.00    Years: 50.00    Pack years: 50.00    Types: Cigars    Last attempt to quit: 10/21/2008    Years since quitting: 9.8  . Smokeless tobacco: Never Used  . Tobacco comment: Now smoking cigars  Substance and Sexual Activity  . Alcohol use: Yes  . Drug use: No  . Sexual activity: Not on file  Lifestyle  . Physical activity:    Days per week: Not on file    Minutes per session: Not on file  . Stress: Not on file  Relationships  . Social connections:    Talks on phone: Not on file    Gets together: Not on file    Attends religious service: Not on file    Active member of club or organization: Not on file    Attends meetings of clubs or organizations: Not on file    Relationship status: Not on file  . Intimate partner violence:    Fear of current or ex partner: Not  on file    Emotionally abused: Not on file    Physically abused: Not on file    Forced sexual activity: Not on file  Other Topics Concern  . Not on file  Social History Narrative   No Regular Exercise    No Known Allergies  Current Outpatient Medications  Medication Sig Dispense Refill  . aspirin (BAYER ASPIRIN) 81 MG tablet Take 1 tablet (81 mg total) by mouth daily.    Marland Kitchen. atorvastatin (LIPITOR) 80 MG tablet TAKE 1 TABLET DAILY AT 6PM. 90 tablet 3  . metoprolol tartrate (LOPRESSOR) 50 MG tablet TAKE 1 TABLET TWO TIMES DAILY. 180 tablet 3  . omeprazole (PRILOSEC) 10 MG capsule Take 10 mg by mouth daily.     . traZODone (DESYREL) 50 MG tablet Take 0.5-1 tablets (25-50 mg total) by mouth at bedtime as needed for sleep. 90 tablet 1   No current  facility-administered medications for this visit.     REVIEW OF SYSTEMS:  [X]  denotes positive finding, [ ]  denotes negative finding Cardiac  Comments:  Chest pain or chest pressure:    Shortness of breath upon exertion:    Short of breath when lying flat:    Irregular heart rhythm:        Vascular    Pain in calf, thigh, or hip brought on by ambulation:    Pain in feet at night that wakes you up from your sleep:     Blood clot in your veins:    Leg swelling:         Pulmonary    Oxygen at home:    Productive cough:  x   Wheezing:         Neurologic    Sudden weakness in arms or legs:     Sudden numbness in arms or legs:  x   Sudden onset of difficulty speaking or slurred speech:    Temporary loss of vision in one eye:     Problems with dizziness:         Gastrointestinal    Blood in stool:  x   Vomited blood:         Genitourinary    Burning when urinating:     Blood in urine:        Psychiatric    Major depression:         Hematologic    Bleeding problems:    Problems with blood clotting too easily:        Skin    Rashes or ulcers:        Constitutional    Fever or chills:      PHYSICAL EXAM: Vitals:   09/12/18 1357  BP: 132/85  Pulse: 75  Resp: 14  Temp: 97.7 F (36.5 C)  SpO2: 96%  Weight: 211 lb (95.7 kg)  Height: 5\' 6"  (1.676 m)    GENERAL: The patient is a well-nourished male, in no acute distress. The vital signs are documented above. CARDIOVASCULAR: He does have left carotid bruit.  I do not hear a right carotid bruit.  He has 2+ radial and 2+ posterior tibial pulses bilaterally. PULMONARY: There is good air exchange  ABDOMEN: Soft and non-tender.  No aneurysm palpable MUSCULOSKELETAL: There are no major deformities or cyanosis. NEUROLOGIC: No focal weakness or paresthesias are detected. SKIN: There are no ulcers or rashes noted. PSYCHIATRIC: The patient has a normal affect.  DATA:  I reviewed his study from [facility] and also repeat  study  today to determine if he was a candidate for surgery based on duplex alone.  This does confirm high-grade stenosis at the distal common carotid proximal internal carotid region of his left internal carotid artery.  End-diastolic velocities are 111 cm/s  MEDICAL ISSUES: Long discussion with the patient and his wife.  He is right-handed and this is left hemisphere involvement.  I explained that this would put him at approximately 5 %/year risk of neurologic deficit based on his high-grade asymptomatic disease.  I discussed the option of left carotid endarterectomy for reduction of stroke risk.  I explained the procedure with an expected one night hospitalization.  Also explained the 1 to 1-1/2% risk of stroke with surgery and a very slight risk of cranial nerve injury and infection and bleeding.  He understands and wishes to proceed with surgery.  We will schedule this at his earliest convenience.   Larina Earthlyodd F. Pascale Maves, MD FACS [facility] Office Tel [phone] Pager [phone]

## 2018-09-12 NOTE — Telephone Encounter (Signed)
Request for Surgical Clearance  1. What type of surgery is being performed?  LEFT CAROTID ENDARTERECTOMY    2. When is this surgery scheduled? 10/12/2018  3. What type of clearance is required (medical clearance vs. Pharmacy clearance to hold med vs. Both)? CARDIAC CLEARANCE    4. Are there any medications that need to be held prior to surgery and how long?  NONE    5. Practice name and name of physician performing surgery?  [facility] DR. TODD EARLY     6.  What is your office phone number? [phone]    7. What is your office fax number? (Be sure to include anyone who it needs to go Attn to) [phone] [email or web address]. [email or web address]    8. Anesthesia type (None, local, MAC, general)? GENERAL    REMINDER TO USER: Remember to please route this message to P CV DIV PREOP in a phone note.

## 2018-09-14 ENCOUNTER — Other Ambulatory Visit: Payer: Self-pay | Admitting: *Deleted

## 2018-09-14 NOTE — Telephone Encounter (Signed)
   Primary Cardiologist: No primary care provider on file.  Chart reviewed as part of pre-operative protocol coverage. Patient was contacted 09/14/2018 in reference to pre-operative risk assessment for pending surgery as outlined below.  Ethan Estrada was last seen on 05/31/2018 by Dr. Jens Estrada.  Since that day, Ethan Estrada has done well without any chest pain or shortness of breath. Last Ethan Estrada was in Aug 2018 which showed normal EF, prior infarct but no ischemia. Talking with the patient, he is able to complete at least 4 METS of activity without any chest pain or shortness of breath. His RCRI perioperative risk is Class III, 6.6% risk of major cardiac event.   Therefore, based on ACC/AHA guidelines, the patient would be at acceptable risk for the planned procedure without further cardiovascular testing.   I will route this recommendation to the requesting party via Epic fax function and remove from pre-op pool.  Please call with questions.  Ethan Estrada, [place] 09/14/2018, 5:06 PM

## 2018-09-29 NOTE — Pre-Procedure Instructions (Signed)
Masun Merson  09/29/2018      [facility] [place],  - [address] [place] [address] Phone: [phone] Fax: [phone]    Your procedure is scheduled on January 23rd.  Report to [facility] Admitting at 0530 A.M.  Call this number if you have problems the morning of surgery:  [phone]   Remember:  Do not eat or drink after midnight.    Take these medicines the morning of surgery with A SIP OF WATER  metoprolol tartrate (LOPRESSOR) omeprazole (PRILOSEC)   Follow your surgeon's instructions on when to stop Asprin.  If no instructions were given by your surgeon then you will need to call the office to get those instructions.    7 days prior to surgery STOP taking any Aspirin (unless otherwise instructed by your surgeon), Aleve, Naproxen, Ibuprofen, Motrin, Advil, Goody's, BC's, all herbal medications, fish oil, and all vitamins.    Do not wear jewelry.  Do not wear lotions, powders, or colognes, or deodorant.  Men may shave face and neck.  Do not bring valuables to the hospital.  [facility] is not responsible for any belongings or valuables.  Contacts, dentures or bridgework may not be worn into surgery.  Leave your suitcase in the car.  After surgery it may be brought to your room.  For patients admitted to the hospital, discharge time will be determined by your treatment team.  Patients discharged the day of surgery will not be allowed to drive home.    [place]- Preparing For Surgery  Before surgery, you can play an important role. Because skin is not sterile, your skin needs to be as free of germs as possible. You can reduce the number of germs on your skin by washing with CHG (chlorahexidine gluconate) Soap before surgery.  CHG is an antiseptic cleaner which kills germs and bonds with the skin to continue killing germs even after washing.    Oral Hygiene is also important to reduce your  risk of infection.  Remember - BRUSH YOUR TEETH THE MORNING OF SURGERY WITH YOUR REGULAR TOOTHPASTE  Please do not use if you have an allergy to CHG or antibacterial soaps. If your skin becomes reddened/irritated stop using the CHG.  Do not shave (including legs and underarms) for at least 48 hours prior to first CHG shower. It is OK to shave your face.  Please follow these instructions carefully.   1. Shower the NIGHT BEFORE SURGERY and the MORNING OF SURGERY with CHG.   2. If you chose to wash your hair, wash your hair first as usual with your normal shampoo.  3. After you shampoo, rinse your hair and body thoroughly to remove the shampoo.  4. Use CHG as you would any other liquid soap. You can apply CHG directly to the skin and wash gently with a scrungie or a clean washcloth.   5. Apply the CHG Soap to your body ONLY FROM THE NECK DOWN.  Do not use on open wounds or open sores. Avoid contact with your eyes, ears, mouth and genitals (private parts). Wash Face and genitals (private parts)  with your normal soap.  6. Wash thoroughly, paying special attention to the area where your surgery will be performed.  7. Thoroughly rinse your body with warm water from the neck down.  8. DO NOT shower/wash with your normal soap after using and rinsing off the CHG Soap.  9. Dennie Bible  yourself dry with a CLEAN TOWEL.  10. Wear CLEAN PAJAMAS to bed the night before surgery, wear comfortable clothes the morning of surgery  11. Place CLEAN SHEETS on your bed the night of your first shower and DO NOT SLEEP WITH PETS.    Day of Surgery:  Do not apply any deodorants/lotions.  Please wear clean clothes to the hospital/surgery center.   Remember to brush your teeth WITH YOUR REGULAR TOOTHPASTE.    Please read over the following fact sheets that you were given.

## 2018-10-02 ENCOUNTER — Encounter (HOSPITAL_COMMUNITY): Payer: Self-pay

## 2018-10-02 ENCOUNTER — Other Ambulatory Visit: Payer: Self-pay

## 2018-10-02 ENCOUNTER — Encounter (HOSPITAL_COMMUNITY)
Admission: RE | Admit: 2018-10-02 | Discharge: 2018-10-02 | Disposition: A | Discharge status: Home / Self Care | Payer: Medicare HMO | Source: Ambulatory Visit | Attending: Vascular Surgery | Admitting: Vascular Surgery

## 2018-10-02 DIAGNOSIS — Z01812 Encounter for preprocedural laboratory examination: Secondary | ICD-10-CM | POA: Insufficient documentation

## 2018-10-02 HISTORY — DX: Gastro-esophageal reflux disease without esophagitis: K21.9

## 2018-10-02 LAB — COMPREHENSIVE METABOLIC PANEL
ALT: 20 U/L (ref 0–44)
AST: 23 U/L (ref 15–41)
Albumin: 3.7 g/dL (ref 3.5–5.0)
Alkaline Phosphatase: 59 U/L (ref 38–126)
Anion gap: 10 (ref 5–15)
BUN: 13 mg/dL (ref 8–23)
CO2: 25 mmol/L (ref 22–32)
Calcium: 9.7 mg/dL (ref 8.9–10.3)
Chloride: 105 mmol/L (ref 98–111)
Creatinine, Ser: 1.37 mg/dL — ABNORMAL HIGH (ref 0.61–1.24)
GFR calc Af Amer: 58 mL/min — ABNORMAL LOW (ref >60)
GFR calc non Af Amer: 50 mL/min — ABNORMAL LOW (ref >60)
Glucose, Bld: 104 mg/dL — ABNORMAL HIGH (ref 70–99)
Potassium: 4.5 mmol/L (ref 3.5–5.1)
Sodium: 140 mmol/L (ref 135–145)
Total Bilirubin: 1.1 mg/dL (ref 0.3–1.2)
Total Protein: 7 g/dL (ref 6.5–8.1)

## 2018-10-02 LAB — APTT: aPTT: 32 seconds (ref 24–36)

## 2018-10-02 LAB — URINALYSIS, ROUTINE W REFLEX MICROSCOPIC
Bilirubin Urine: NEGATIVE
Glucose, UA: NEGATIVE mg/dL
Hgb urine dipstick: NEGATIVE
Ketones, ur: NEGATIVE mg/dL
Leukocytes, UA: NEGATIVE
Nitrite: NEGATIVE
Protein, ur: NEGATIVE mg/dL
Specific Gravity, Urine: 1.018 (ref 1.005–1.030)
pH: 6 (ref 5.0–8.0)

## 2018-10-02 LAB — TYPE AND SCREEN
ABO/RH(D): O POS
Antibody Screen: NEGATIVE

## 2018-10-02 LAB — CBC
HCT: 48.8 % (ref 39.0–52.0)
Hemoglobin: 15.3 g/dL (ref 13.0–17.0)
MCH: 28.6 pg (ref 26.0–34.0)
MCHC: 31.4 g/dL (ref 30.0–36.0)
MCV: 91.2 fL (ref 80.0–100.0)
Platelets: 196 10*3/uL (ref 150–400)
RBC: 5.35 MIL/uL (ref 4.22–5.81)
RDW: 14.9 % (ref 11.5–15.5)
WBC: 9.3 10*3/uL (ref 4.0–10.5)
nRBC: 0 % (ref 0.0–0.2)

## 2018-10-02 LAB — PROTIME-INR
INR: 1.07
Prothrombin Time: 13.8 seconds (ref 11.4–15.2)

## 2018-10-02 LAB — SURGICAL PCR SCREEN
MRSA, PCR: NEGATIVE
STAPHYLOCOCCUS AUREUS: NEGATIVE

## 2018-10-02 NOTE — Progress Notes (Signed)
PCP - Janyth Pupa Marlene Bast Cardiologist - [place]  Chest x-ray - not needed EKG - 05/31/18 Stress Test - 2018 ECHO - 2010 - in care everywhere Cardiac Cath - > 20 years    Aspirin Instructions: continue aspirin  Anesthesia review: yes, cardiac history  Patient denies shortness of breath, fever, cough and chest pain at PAT appointment   Patient verbalized understanding of instructions that were given to them at the PAT appointment. Patient was also instructed that they will need to review over the PAT instructions again at home before surgery.

## 2018-10-02 NOTE — Pre-Procedure Instructions (Signed)
Ethan Estrada  10/02/2018      [facility] [place], [place] - [address] [place] [address] Phone: [phone] Fax: [phone]    Your procedure is scheduled on January 23rd.  Report to [facility] Admitting at 0530 A.M.  Call this number if you have problems the morning of surgery:  [phone]   Remember:  Do not eat or drink after midnight.    Take these medicines the morning of surgery with A SIP OF WATER  metoprolol tartrate (LOPRESSOR) omeprazole (PRILOSEC)   aspirin (BAYER ASPIRIN)   7 days prior to surgery STOP taking any Aleve, Naproxen, Ibuprofen, Motrin, Advil, Goody's, BC's, all herbal medications, fish oil, and all vitamins.    Do not wear jewelry.  Do not wear lotions, powders, or colognes, or deodorant.  Men may shave face and neck.  Do not bring valuables to the hospital.  [facility] is not responsible for any belongings or valuables.  Contacts, dentures or bridgework may not be worn into surgery.  Leave your suitcase in the car.  After surgery it may be brought to your room.  For patients admitted to the hospital, discharge time will be determined by your treatment team.  Patients discharged the day of surgery will not be allowed to drive home.    [place]- Preparing For Surgery  Before surgery, you can play an important role. Because skin is not sterile, your skin needs to be as free of germs as possible. You can reduce the number of germs on your skin by washing with CHG (chlorahexidine gluconate) Soap before surgery.  CHG is an antiseptic cleaner which kills germs and bonds with the skin to continue killing germs even after washing.    Oral Hygiene is also important to reduce your risk of infection.  Remember - BRUSH YOUR TEETH THE MORNING OF SURGERY WITH YOUR REGULAR TOOTHPASTE  Please do not use if you have an allergy to CHG or antibacterial soaps. If your skin becomes  reddened/irritated stop using the CHG.  Do not shave (including legs and underarms) for at least 48 hours prior to first CHG shower. It is OK to shave your face.  Please follow these instructions carefully.   1. Shower the NIGHT BEFORE SURGERY and the MORNING OF SURGERY with CHG.   2. If you chose to wash your hair, wash your hair first as usual with your normal shampoo.  3. After you shampoo, rinse your hair and body thoroughly to remove the shampoo.  4. Use CHG as you would any other liquid soap. You can apply CHG directly to the skin and wash gently with a scrungie or a clean washcloth.   5. Apply the CHG Soap to your body ONLY FROM THE NECK DOWN.  Do not use on open wounds or open sores. Avoid contact with your eyes, ears, mouth and genitals (private parts). Wash Face and genitals (private parts)  with your normal soap.  6. Wash thoroughly, paying special attention to the area where your surgery will be performed.  7. Thoroughly rinse your body with warm water from the neck down.  8. DO NOT shower/wash with your normal soap after using and rinsing off the CHG Soap.  9. Pat yourself dry with a CLEAN TOWEL.  10. Wear CLEAN PAJAMAS to bed the night before surgery, wear comfortable clothes the morning of surgery  11. Place CLEAN SHEETS on your bed the night of  your first shower and DO NOT SLEEP WITH PETS.    Day of Surgery:  Do not apply any deodorants/lotions.  Please wear clean clothes to the hospital/surgery center.   Remember to brush your teeth WITH YOUR REGULAR TOOTHPASTE.    Please read over the following fact sheets that you were given.

## 2018-10-03 LAB — ABO/RH: ABO/RH(D): O POS

## 2018-10-03 NOTE — Progress Notes (Signed)
Anesthesia Chart Review:  Case:  [id number] Date/Time:  10/12/18 0715   Procedure:  ENDARTERECTOMY CAROTID LEFT (Left )   Anesthesia type:  General   Pre-op diagnosis:  LEFT CAROTID ARTERY STENOSIS   Location:  [facility] ROOM 12 / [facility] OR   Surgeon:  Ethan Earthly, MD      DISCUSSION: Patient is a 73 year old male scheduled for the above procedure.  History includes smoking, CAD (inferior MI 1985 s/p PCI; CABG: LIMA-LAD, SVG-CX, free RIMA-RI 11/04/08 in [place]), HLD, GERD, renal hematoma 2005, cerebrovascular disease, carotid stenosis..   Cardiology preoperative risk assessment per Ethan Course, PA-C on 09/14/18: ".Marland KitchenMarland KitchenEmilie Estrada has done well without any chest pain or shortness of breath. Last Ethan Estrada was in Aug 2018 which showed normal EF, prior infarct but no ischemia. Talking with the patient, he is able to complete at least 4 METS of activity without any chest pain or shortness of breath. His RCRI perioperative risk is Class III, 6.6% risk of major cardiac event.   Therefore, based on ACC/AHA guidelines, the patient would be at acceptable risk for the planned procedure without further cardiovascular testing."  If no acute changes then I anticipate that he can proceed as planned.   VS: BP (!) 155/71   Pulse 70   Temp 36.7 C   Resp 18   Ht 5\' 6"  (1.676 m)   Wt 95.7 kg   SpO2 97%   BMI 34.06 kg/m    PROVIDERS: Ethan Dory, DO is PCP Ethan Millers, MD is cardiologist. Last visit 05/31/18. Continue medical therapy recommended.    LABS: Labs reviewed: Acceptable for surgery. Cr 1.37 which is stable when compared to 04/20/17 labs.  (all labs ordered are listed, but only abnormal results are displayed)  Labs Reviewed  COMPREHENSIVE METABOLIC PANEL - Abnormal; Notable for the following components:      Result Value   Glucose, Bld 104 (*)    Creatinine, Ser 1.37 (*)    GFR calc non Af Amer 50 (*)    GFR calc Af Amer 58 (*)    All other components within normal limits   SURGICAL PCR SCREEN  APTT  CBC  PROTIME-INR  URINALYSIS, ROUTINE W REFLEX MICROSCOPIC  TYPE AND SCREEN  ABO/RH    EKG: 05/31/18 ([facility]): SR with first degree AV block. Junctional ST depression, probably normal.   CV: Carotid U/S 09/12/18: Summary: Right Carotid: Velocities in the right ICA are consistent with a 1-39% stenosis. Left Carotid: Velocities in the left ICA are consistent with a 1-39% stenosis.               Hemodynamically significant plaque >50% visualized in the distal               CCA/bifurcation. Vertebrals:  Bilateral vertebral arteries demonstrate antegrade flow. Subclavians: Normal flow hemodynamics were seen in bilateral subclavian              arteries.  Nuclear stress test 05/05/17:  The left ventricular ejection fraction is mildly decreased (45-54%).  Nuclear stress EF: 50%.  There was no ST segment deviation noted during stress.  No T wave inversion was noted during stress.  Defect 1: There is a medium defect of severe severity present in the basal inferoseptal, basal inferior and mid inferior location.  Findings consistent with prior myocardial infarction.  This is a low risk study. Low risk stress nuclear study with inferior scar without significant ischemia. There is mildly reduced left ventricular global  systolic function due to inferior akinesis.  Abdominal aortic U/S 05/05/17: Technically challenging study. Normal caliber abdominal aorta, common and external iliac arteries, without dilatation. >50% stenosis in the bilateral common iliac arteries.  Echo 11/06/08 [facility] Everywhere): Summary LV is normal in size. Normal LV wall thickness. LV systolic function is low normal. Septal dyssynergy present. EF 50-55%. RV EF is normal. Trace MR. Mild TR. RVSP is elevated at 30-40 mmHg, consistent with mild pulmonary hypertension.   Past Medical History:  Diagnosis Date  . Acute myocardial infarction, unspecified site, episode of care  unspecified 1985  . CAD (coronary artery disease)   . Cerebrovascular disease, unspecified   . Coronary atherosclerosis of unspecified type of vessel, native or graft   . GERD (gastroesophageal reflux disease)   . History of heart bypass surgery   . MI (myocardial infarction) (HCC)   . Other and unspecified hyperlipidemia   . Renal hematoma 2005    Past Surgical History:  Procedure Laterality Date  . COLONOSCOPY WITH ESOPHAGOGASTRODUODENOSCOPY (EGD)    . CORONARY ARTERY BYPASS GRAFT     S/P in February of 2010. Procedure performed [facility]  . EYE SURGERY Left    cataracts  . HEMORRHOID SURGERY    . NECK SURGERY    . TONSILLECTOMY      MEDICATIONS: . aspirin (BAYER ASPIRIN) 81 MG tablet  . atorvastatin (LIPITOR) 80 MG tablet  . metoprolol tartrate (LOPRESSOR) 50 MG tablet  . omeprazole (PRILOSEC) 10 MG capsule  . traZODone (DESYREL) 50 MG tablet   No current facility-administered medications for this encounter.     Ethan Chock, PA-C Surgical Short Stay/Anesthesiology [facility] Phone [phone] [facility] Phone [phone] 10/03/2018 8:45 PM

## 2018-10-03 NOTE — Anesthesia Preprocedure Evaluation (Addendum)
Anesthesia Evaluation  Patient identified by MRN, date of birth, ID band Patient awake    Reviewed: Allergy & Precautions, NPO status , Patient's Chart, lab work & pertinent test results  History of Anesthesia Complications Negative for: history of anesthetic complications  Airway Mallampati: II  TM Distance: >3 FB Neck ROM: Full    Dental  (+) Upper Dentures, Lower Dentures, Dental Advisory Given   Pulmonary Current Smoker,    Pulmonary exam normal breath sounds clear to auscultation       Cardiovascular + CAD, + Past MI and + CABG  Normal cardiovascular exam Rhythm:Regular Rate:Normal  Left carotid artery stenosis  Nuclear stress test 05/05/17: EF 50%, medium defect of severe severity present in the basal inferoseptal, basal inferior and mid inferior location, findings consistent with prior myocardial infarction.   Neuro/Psych negative neurological ROS     GI/Hepatic Neg liver ROS, GERD  Medicated and Controlled,  Endo/Other  negative endocrine ROS  Renal/GU negative Renal ROS     Musculoskeletal negative musculoskeletal ROS (+)   Abdominal   Peds  Hematology negative hematology ROS (+)   Anesthesia Other Findings Day of surgery medications reviewed with the patient.  Reproductive/Obstetrics                           Anesthesia Physical Anesthesia Plan  ASA: III  Anesthesia Plan: General   Post-op Pain Management:    Induction: Intravenous  PONV Risk Score and Plan: 2 and Treatment may vary due to age or medical condition, Ondansetron and Dexamethasone  Airway Management Planned: Oral ETT  Additional Equipment: Arterial line  Intra-op Plan:   Post-operative Plan: Extubation in OR  Informed Consent: I have reviewed the patients History and Physical, chart, labs and discussed the procedure including the risks, benefits and alternatives for the proposed anesthesia with the  patient or authorized representative who has indicated his/her understanding and acceptance.     Dental advisory given  Plan Discussed with: CRNA  Anesthesia Plan Comments: (PAT note written 10/03/2018 by Shonna Chock, PA-C. )      Anesthesia Quick Evaluation

## 2018-10-12 ENCOUNTER — Encounter (HOSPITAL_COMMUNITY): Payer: Self-pay | Admitting: *Deleted

## 2018-10-12 ENCOUNTER — Encounter (HOSPITAL_COMMUNITY)
Admission: RE | Disposition: A | Discharge status: Home / Self Care | Payer: Self-pay | Source: Home / Self Care | Attending: Vascular Surgery

## 2018-10-12 ENCOUNTER — Inpatient Hospital Stay (HOSPITAL_COMMUNITY): Payer: Medicare HMO | Admitting: Vascular Surgery

## 2018-10-12 ENCOUNTER — Telehealth: Payer: Self-pay | Admitting: Vascular Surgery

## 2018-10-12 ENCOUNTER — Inpatient Hospital Stay (HOSPITAL_COMMUNITY)
Admission: RE | Admit: 2018-10-12 | Discharge: 2018-10-13 | DRG: 039 | Disposition: A | Discharge status: Home / Self Care | Payer: Medicare HMO | Attending: Vascular Surgery | Admitting: Vascular Surgery

## 2018-10-12 ENCOUNTER — Inpatient Hospital Stay (HOSPITAL_COMMUNITY): Payer: Medicare HMO | Admitting: Anesthesiology

## 2018-10-12 DIAGNOSIS — Z79899 Other long term (current) drug therapy: Secondary | ICD-10-CM | POA: Diagnosis not present

## 2018-10-12 DIAGNOSIS — I252 Old myocardial infarction: Secondary | ICD-10-CM

## 2018-10-12 DIAGNOSIS — Z8249 Family history of ischemic heart disease and other diseases of the circulatory system: Secondary | ICD-10-CM

## 2018-10-12 DIAGNOSIS — Z951 Presence of aortocoronary bypass graft: Secondary | ICD-10-CM

## 2018-10-12 DIAGNOSIS — E785 Hyperlipidemia, unspecified: Secondary | ICD-10-CM | POA: Diagnosis present

## 2018-10-12 DIAGNOSIS — K219 Gastro-esophageal reflux disease without esophagitis: Secondary | ICD-10-CM | POA: Diagnosis present

## 2018-10-12 DIAGNOSIS — I2510 Atherosclerotic heart disease of native coronary artery without angina pectoris: Secondary | ICD-10-CM | POA: Diagnosis present

## 2018-10-12 DIAGNOSIS — Z9861 Coronary angioplasty status: Secondary | ICD-10-CM

## 2018-10-12 DIAGNOSIS — I6522 Occlusion and stenosis of left carotid artery: Secondary | ICD-10-CM | POA: Diagnosis present

## 2018-10-12 DIAGNOSIS — F17210 Nicotine dependence, cigarettes, uncomplicated: Secondary | ICD-10-CM | POA: Diagnosis present

## 2018-10-12 DIAGNOSIS — Z7982 Long term (current) use of aspirin: Secondary | ICD-10-CM | POA: Diagnosis not present

## 2018-10-12 HISTORY — PX: ENDARTERECTOMY: SHX5162

## 2018-10-12 SURGERY — ENDARTERECTOMY, CAROTID
Anesthesia: General | Site: Neck | Laterality: Left

## 2018-10-12 MED ORDER — OXYCODONE HCL 5 MG/5ML PO SOLN: 5.0000 mg | Freq: Once | ORAL | Status: DC | PRN

## 2018-10-12 MED ORDER — LABETALOL HCL 5 MG/ML IV SOLN: 10.0000 mg | INTRAVENOUS | Status: DC | PRN

## 2018-10-12 MED ORDER — PROTAMINE SULFATE 10 MG/ML IV SOLN
INTRAVENOUS | Status: AC
  Filled 2018-10-12: qty 5

## 2018-10-12 MED ORDER — ALBUMIN HUMAN 5 % IV SOLN
12.5000 g | Freq: Once | INTRAVENOUS | Status: AC
  Administered 2018-10-12: 12.5 g via INTRAVENOUS

## 2018-10-12 MED ORDER — ONDANSETRON HCL 4 MG/2ML IJ SOLN: 4.0000 mg | Freq: Four times a day (QID) | INTRAMUSCULAR | Status: DC | PRN

## 2018-10-12 MED ORDER — BISACODYL 10 MG RE SUPP: 10.0000 mg | Freq: Every day | RECTAL | Status: DC | PRN

## 2018-10-12 MED ORDER — 0.9 % SODIUM CHLORIDE (POUR BTL) OPTIME
TOPICAL | Status: DC | PRN
  Administered 2018-10-12: 2000 mL

## 2018-10-12 MED ORDER — CHLORHEXIDINE GLUCONATE 4 % EX LIQD: 60.0000 mL | Freq: Once | CUTANEOUS | Status: DC

## 2018-10-12 MED ORDER — PROPOFOL 10 MG/ML IV BOLUS
INTRAVENOUS | Status: DC | PRN
  Administered 2018-10-12: 170 mg via INTRAVENOUS

## 2018-10-12 MED ORDER — ROCURONIUM BROMIDE 10 MG/ML (PF) SYRINGE
PREFILLED_SYRINGE | INTRAVENOUS | Status: DC | PRN
  Administered 2018-10-12: 20 mg via INTRAVENOUS
  Administered 2018-10-12: 50 mg via INTRAVENOUS

## 2018-10-12 MED ORDER — ALUM & MAG HYDROXIDE-SIMETH 200-200-20 MG/5ML PO SUSP: 15.0000 mL | ORAL | Status: DC | PRN

## 2018-10-12 MED ORDER — LACTATED RINGERS IV SOLN
INTRAVENOUS | Status: DC | PRN
  Administered 2018-10-12: 07:00:00 via INTRAVENOUS

## 2018-10-12 MED ORDER — HYDRALAZINE HCL 20 MG/ML IJ SOLN: 5.0000 mg | INTRAMUSCULAR | Status: DC | PRN

## 2018-10-12 MED ORDER — GUAIFENESIN-DM 100-10 MG/5ML PO SYRP: 15.0000 mL | ORAL_SOLUTION | ORAL | Status: DC | PRN

## 2018-10-12 MED ORDER — FENTANYL CITRATE (PF) 100 MCG/2ML IJ SOLN
INTRAMUSCULAR | Status: DC | PRN
  Administered 2018-10-12 (×2): 50 ug via INTRAVENOUS

## 2018-10-12 MED ORDER — CEFAZOLIN SODIUM-DEXTROSE 2-4 GM/100ML-% IV SOLN
2.0000 g | Freq: Three times a day (TID) | INTRAVENOUS | Status: AC
  Administered 2018-10-12 (×2): 2 g via INTRAVENOUS
  Filled 2018-10-12 (×2): qty 100

## 2018-10-12 MED ORDER — SODIUM CHLORIDE 0.9 % IV SOLN: INTRAVENOUS | Status: DC

## 2018-10-12 MED ORDER — PROPOFOL 10 MG/ML IV BOLUS
INTRAVENOUS | Status: AC
  Filled 2018-10-12: qty 40

## 2018-10-12 MED ORDER — METOPROLOL TARTRATE 50 MG PO TABS
50.0000 mg | ORAL_TABLET | Freq: Two times a day (BID) | ORAL | Status: DC
  Administered 2018-10-13: 50 mg via ORAL
  Filled 2018-10-12: qty 1

## 2018-10-12 MED ORDER — POLYETHYLENE GLYCOL 3350 17 G PO PACK: 17.0000 g | PACK | Freq: Every day | ORAL | Status: DC | PRN

## 2018-10-12 MED ORDER — FENTANYL CITRATE (PF) 250 MCG/5ML IJ SOLN
INTRAMUSCULAR | Status: AC
  Filled 2018-10-12: qty 5

## 2018-10-12 MED ORDER — PHENYLEPHRINE 40 MCG/ML (10ML) SYRINGE FOR IV PUSH (FOR BLOOD PRESSURE SUPPORT)
PREFILLED_SYRINGE | INTRAVENOUS | Status: DC | PRN
  Administered 2018-10-12: 80 ug via INTRAVENOUS
  Administered 2018-10-12: 25 ug via INTRAVENOUS
  Administered 2018-10-12: 40 ug via INTRAVENOUS
  Administered 2018-10-12: 80 ug via INTRAVENOUS
  Administered 2018-10-12: 120 ug via INTRAVENOUS

## 2018-10-12 MED ORDER — SODIUM CHLORIDE 0.9 % IV SOLN: 500.0000 mL | Freq: Once | INTRAVENOUS | Status: DC | PRN

## 2018-10-12 MED ORDER — MAGNESIUM SULFATE 2 GM/50ML IV SOLN: 2.0000 g | Freq: Every day | INTRAVENOUS | Status: DC | PRN

## 2018-10-12 MED ORDER — FENTANYL CITRATE (PF) 100 MCG/2ML IJ SOLN
INTRAMUSCULAR | Status: AC
  Administered 2018-10-12: 50 ug via INTRAVENOUS
  Filled 2018-10-12: qty 2

## 2018-10-12 MED ORDER — SODIUM CHLORIDE 0.9 % IV SOLN
INTRAVENOUS | Status: AC
  Filled 2018-10-12: qty 1.2

## 2018-10-12 MED ORDER — SODIUM CHLORIDE 0.9 % IV SOLN
0.0500 ug/kg/min | INTRAVENOUS | Status: DC
  Filled 2018-10-12: qty 5000

## 2018-10-12 MED ORDER — LIDOCAINE HCL (PF) 1 % IJ SOLN
INTRAMUSCULAR | Status: AC
  Filled 2018-10-12: qty 5

## 2018-10-12 MED ORDER — FENTANYL CITRATE (PF) 100 MCG/2ML IJ SOLN
25.0000 ug | INTRAMUSCULAR | Status: DC | PRN
  Administered 2018-10-12: 50 ug via INTRAVENOUS

## 2018-10-12 MED ORDER — LIDOCAINE 2% (20 MG/ML) 5 ML SYRINGE
INTRAMUSCULAR | Status: AC
  Filled 2018-10-12: qty 5

## 2018-10-12 MED ORDER — PANTOPRAZOLE SODIUM 40 MG PO TBEC
40.0000 mg | DELAYED_RELEASE_TABLET | Freq: Every day | ORAL | Status: DC
  Administered 2018-10-13: 40 mg via ORAL
  Filled 2018-10-12: qty 1

## 2018-10-12 MED ORDER — POTASSIUM CHLORIDE CRYS ER 20 MEQ PO TBCR: 20.0000 meq | EXTENDED_RELEASE_TABLET | Freq: Every day | ORAL | Status: DC | PRN

## 2018-10-12 MED ORDER — GLYCOPYRROLATE 0.2 MG/ML IJ SOLN
INTRAMUSCULAR | Status: DC | PRN
  Administered 2018-10-12: 0.2 mg via INTRAVENOUS

## 2018-10-12 MED ORDER — GLYCOPYRROLATE PF 0.2 MG/ML IJ SOSY
PREFILLED_SYRINGE | INTRAMUSCULAR | Status: AC
  Filled 2018-10-12: qty 1

## 2018-10-12 MED ORDER — OXYCODONE-ACETAMINOPHEN 5-325 MG PO TABS
ORAL_TABLET | ORAL | Status: AC
  Administered 2018-10-12: 2 via ORAL
  Filled 2018-10-12: qty 2

## 2018-10-12 MED ORDER — LIDOCAINE 2% (20 MG/ML) 5 ML SYRINGE
INTRAMUSCULAR | Status: DC | PRN
  Administered 2018-10-12: 100 mg via INTRAVENOUS

## 2018-10-12 MED ORDER — METOPROLOL TARTRATE 50 MG PO TABS
50.0000 mg | ORAL_TABLET | Freq: Once | ORAL | Status: AC
  Administered 2018-10-12: 50 mg via ORAL
  Filled 2018-10-12 (×2): qty 1

## 2018-10-12 MED ORDER — MORPHINE SULFATE (PF) 2 MG/ML IV SOLN
2.0000 mg | INTRAVENOUS | Status: DC | PRN
  Administered 2018-10-12 – 2018-10-13 (×5): 2 mg via INTRAVENOUS
  Filled 2018-10-12 (×5): qty 1

## 2018-10-12 MED ORDER — METOPROLOL TARTRATE 5 MG/5ML IV SOLN: 2.0000 mg | INTRAVENOUS | Status: DC | PRN

## 2018-10-12 MED ORDER — SODIUM CHLORIDE 0.9 % IV SOLN
INTRAVENOUS | Status: DC
  Administered 2018-10-12 – 2018-10-13 (×2): via INTRAVENOUS

## 2018-10-12 MED ORDER — OXYCODONE-ACETAMINOPHEN 5-325 MG PO TABS
1.0000 | ORAL_TABLET | ORAL | Status: DC | PRN
  Administered 2018-10-12 – 2018-10-13 (×4): 2 via ORAL
  Filled 2018-10-12 (×3): qty 2

## 2018-10-12 MED ORDER — SUGAMMADEX SODIUM 200 MG/2ML IV SOLN
INTRAVENOUS | Status: DC | PRN
  Administered 2018-10-12: 200 mg via INTRAVENOUS

## 2018-10-12 MED ORDER — PROTAMINE SULFATE 10 MG/ML IV SOLN
INTRAVENOUS | Status: DC | PRN
  Administered 2018-10-12: 50 mg via INTRAVENOUS

## 2018-10-12 MED ORDER — PHENOL 1.4 % MT LIQD: 1.0000 | OROMUCOSAL | Status: DC | PRN

## 2018-10-12 MED ORDER — ASPIRIN EC 81 MG PO TBEC
81.0000 mg | DELAYED_RELEASE_TABLET | Freq: Every day | ORAL | Status: DC
  Administered 2018-10-13: 81 mg via ORAL
  Filled 2018-10-12: qty 1

## 2018-10-12 MED ORDER — OXYCODONE HCL 5 MG PO TABS: 5.0000 mg | ORAL_TABLET | Freq: Once | ORAL | Status: DC | PRN

## 2018-10-12 MED ORDER — ACETAMINOPHEN 10 MG/ML IV SOLN: 1000.0000 mg | Freq: Once | INTRAVENOUS | Status: DC | PRN

## 2018-10-12 MED ORDER — ONDANSETRON HCL 4 MG/2ML IJ SOLN
INTRAMUSCULAR | Status: DC | PRN
  Administered 2018-10-12: 4 mg via INTRAVENOUS

## 2018-10-12 MED ORDER — ALBUMIN HUMAN 5 % IV SOLN
INTRAVENOUS | Status: AC
  Administered 2018-10-12: 12.5 g via INTRAVENOUS
  Filled 2018-10-12: qty 500

## 2018-10-12 MED ORDER — ACETAMINOPHEN 650 MG RE SUPP: 325.0000 mg | RECTAL | Status: DC | PRN

## 2018-10-12 MED ORDER — PROMETHAZINE HCL 25 MG/ML IJ SOLN: 6.2500 mg | INTRAMUSCULAR | Status: DC | PRN

## 2018-10-12 MED ORDER — SODIUM CHLORIDE 0.9 % IV SOLN
INTRAVENOUS | Status: DC | PRN
  Administered 2018-10-12: 100 ug/min via INTRAVENOUS

## 2018-10-12 MED ORDER — ONDANSETRON HCL 4 MG/2ML IJ SOLN
INTRAMUSCULAR | Status: AC
  Filled 2018-10-12: qty 2

## 2018-10-12 MED ORDER — HEPARIN SODIUM (PORCINE) 1000 UNIT/ML IJ SOLN
INTRAMUSCULAR | Status: AC
  Filled 2018-10-12: qty 1

## 2018-10-12 MED ORDER — ACETAMINOPHEN 325 MG PO TABS: 325.0000 mg | ORAL_TABLET | ORAL | Status: DC | PRN

## 2018-10-12 MED ORDER — TRAZODONE HCL 50 MG PO TABS: 25.0000 mg | ORAL_TABLET | Freq: Every evening | ORAL | Status: DC | PRN

## 2018-10-12 MED ORDER — ROCURONIUM BROMIDE 50 MG/5ML IV SOSY
PREFILLED_SYRINGE | INTRAVENOUS | Status: AC
  Filled 2018-10-12: qty 5

## 2018-10-12 MED ORDER — DOCUSATE SODIUM 100 MG PO CAPS
100.0000 mg | ORAL_CAPSULE | Freq: Every day | ORAL | Status: DC
  Filled 2018-10-12: qty 1

## 2018-10-12 MED ORDER — ATORVASTATIN CALCIUM 80 MG PO TABS
80.0000 mg | ORAL_TABLET | Freq: Every day | ORAL | Status: DC
  Administered 2018-10-12: 80 mg via ORAL
  Filled 2018-10-12: qty 1

## 2018-10-12 MED ORDER — SODIUM CHLORIDE 0.9 % IV SOLN
INTRAVENOUS | Status: DC | PRN
  Administered 2018-10-12: 500 mL

## 2018-10-12 MED ORDER — EPHEDRINE SULFATE-NACL 50-0.9 MG/10ML-% IV SOSY
PREFILLED_SYRINGE | INTRAVENOUS | Status: DC | PRN
  Administered 2018-10-12: 10 mg via INTRAVENOUS
  Administered 2018-10-12 (×2): 5 mg via INTRAVENOUS
  Administered 2018-10-12: 10 mg via INTRAVENOUS

## 2018-10-12 MED ORDER — CEFAZOLIN SODIUM-DEXTROSE 2-4 GM/100ML-% IV SOLN
2.0000 g | INTRAVENOUS | Status: AC
  Administered 2018-10-12: 2 g via INTRAVENOUS
  Filled 2018-10-12: qty 100

## 2018-10-12 MED ORDER — SODIUM CHLORIDE 0.9 % IV SOLN
INTRAVENOUS | Status: DC | PRN
  Administered 2018-10-12: .03 ug/kg/min via INTRAVENOUS

## 2018-10-12 MED ORDER — HEPARIN SODIUM (PORCINE) 1000 UNIT/ML IJ SOLN
INTRAMUSCULAR | Status: DC | PRN
  Administered 2018-10-12: 9000 [IU] via INTRAVENOUS

## 2018-10-12 SURGICAL SUPPLY — 43 items
CANISTER SUCT 3000ML PPV (MISCELLANEOUS) ×3 IMPLANT
CANNULA VESSEL 3MM 2 BLNT TIP (CANNULA) ×6 IMPLANT
CATH ROBINSON RED A/P 18FR (CATHETERS) ×3 IMPLANT
CLIP LIGATING EXTRA MED SLVR (CLIP) ×3 IMPLANT
CLIP LIGATING EXTRA SM BLUE (MISCELLANEOUS) ×3 IMPLANT
COVER WAND RF STERILE (DRAPES) ×3 IMPLANT
CRADLE DONUT ADULT HEAD (MISCELLANEOUS) ×3 IMPLANT
DECANTER SPIKE VIAL GLASS SM (MISCELLANEOUS) IMPLANT
DERMABOND ADVANCED (GAUZE/BANDAGES/DRESSINGS) ×2
DERMABOND ADVANCED .7 DNX12 (GAUZE/BANDAGES/DRESSINGS) ×1 IMPLANT
DRAIN HEMOVAC 1/8 X 5 (WOUND CARE) IMPLANT
ELECT REM PT RETURN 9FT ADLT (ELECTROSURGICAL) ×3
ELECTRODE REM PT RTRN 9FT ADLT (ELECTROSURGICAL) ×1 IMPLANT
EVACUATOR SILICONE 100CC (DRAIN) IMPLANT
GLOVE BIO SURGEON STRL SZ7.5 (GLOVE) ×6 IMPLANT
GLOVE BIOGEL PI IND STRL 6.5 (GLOVE) ×1 IMPLANT
GLOVE BIOGEL PI IND STRL 7.5 (GLOVE) ×2 IMPLANT
GLOVE BIOGEL PI INDICATOR 6.5 (GLOVE) ×2
GLOVE BIOGEL PI INDICATOR 7.5 (GLOVE) ×4
GLOVE SS BIOGEL STRL SZ 7.5 (GLOVE) ×1 IMPLANT
GLOVE SUPERSENSE BIOGEL SZ 7.5 (GLOVE) ×2
GOWN STRL REUS W/ TWL LRG LVL3 (GOWN DISPOSABLE) ×5 IMPLANT
GOWN STRL REUS W/TWL LRG LVL3 (GOWN DISPOSABLE) ×10
KIT BASIN OR (CUSTOM PROCEDURE TRAY) ×3 IMPLANT
KIT SHUNT ARGYLE CAROTID ART 6 (VASCULAR PRODUCTS) IMPLANT
KIT TURNOVER KIT B (KITS) ×3 IMPLANT
NEEDLE 22X1 1/2 (OR ONLY) (NEEDLE) IMPLANT
NS IRRIG 1000ML POUR BTL (IV SOLUTION) ×6 IMPLANT
PACK CAROTID (CUSTOM PROCEDURE TRAY) ×3 IMPLANT
PAD ARMBOARD 7.5X6 YLW CONV (MISCELLANEOUS) ×6 IMPLANT
PATCH HEMASHIELD 8X75 (Vascular Products) ×3 IMPLANT
SHUNT CAROTID BYPASS 10 (VASCULAR PRODUCTS) ×3 IMPLANT
SHUNT CAROTID BYPASS 12FRX15.5 (VASCULAR PRODUCTS) IMPLANT
SUT ETHILON 3 0 PS 1 (SUTURE) IMPLANT
SUT PROLENE 6 0 CC (SUTURE) ×3 IMPLANT
SUT SILK 3 0 (SUTURE)
SUT SILK 3-0 18XBRD TIE 12 (SUTURE) IMPLANT
SUT VIC AB 3-0 SH 27 (SUTURE) ×6
SUT VIC AB 3-0 SH 27X BRD (SUTURE) ×3 IMPLANT
SUT VICRYL 4-0 PS2 18IN ABS (SUTURE) ×3 IMPLANT
SYR CONTROL 10ML LL (SYRINGE) IMPLANT
TOWEL GREEN STERILE (TOWEL DISPOSABLE) ×3 IMPLANT
WATER STERILE IRR 1000ML POUR (IV SOLUTION) ×3 IMPLANT

## 2018-10-12 NOTE — Anesthesia Procedure Notes (Signed)
Arterial Line Insertion Start/End1/23/2020 7:00 AM, 10/12/2018 7:15 AM Performed by: Caren Macadam, CRNA, CRNA  Patient location: Pre-op. Preanesthetic checklist: patient identified, IV checked, site marked, risks and benefits discussed, surgical consent, monitors and equipment checked, pre-op evaluation, timeout performed and anesthesia consent Lidocaine 1% used for infiltration Right, radial was placed Catheter size: 20 G Hand hygiene performed  and maximum sterile barriers used   Attempts: 2 Procedure performed without using ultrasound guided technique. Following insertion, dressing applied. Post procedure assessment: normal and unchanged

## 2018-10-12 NOTE — Telephone Encounter (Signed)
-----   Message from Dara LordsSamantha J Rhyne, [place] sent at 10/12/2018  9:39 AM EST ----- S/p left CEA 10/12/2018.  F/u with Dr. Arbie CookeyEarly in 2-3 weeks.  Thanks

## 2018-10-12 NOTE — Interval H&P Note (Signed)
History and Physical Interval Note:  10/12/2018 6:30 AM  Ethan Estrada  has presented today for surgery, with the diagnosis of LEFT CAROTID ARTERY STENOSIS  The various methods of treatment have been discussed with the patient and family. After consideration of risks, benefits and other options for treatment, the patient has consented to  Procedure(s): ENDARTERECTOMY CAROTID LEFT (Left) as a surgical intervention .  The patient's history has been reviewed, patient examined, no change in status, stable for surgery.  I have reviewed the patient's chart and labs.  Questions were answered to the patient's satisfaction.     Gretta Began

## 2018-10-12 NOTE — Anesthesia Postprocedure Evaluation (Signed)
Anesthesia Post Note  Patient: Emilie Rutter  Procedure(s) Performed: ENDARTERECTOMY CAROTID LEFT (Left Neck)     Patient location during evaluation: PACU Anesthesia Type: General Level of consciousness: awake and alert Pain management: pain level controlled Vital Signs Assessment: post-procedure vital signs reviewed and stable Respiratory status: spontaneous breathing, nonlabored ventilation and respiratory function stable Cardiovascular status: blood pressure returned to baseline and stable Postop Assessment: no apparent nausea or vomiting Anesthetic complications: no    Last Vitals:  Vitals:   10/12/18 1153 10/12/18 1208  BP: (!) 88/50 (!) 86/50  Pulse: 65 62  Resp: 12 16  Temp:    SpO2: 98% 98%    Last Pain:  Vitals:   10/12/18 1140  TempSrc:   PainSc: 0-No pain                 Kaylyn Layer

## 2018-10-12 NOTE — Progress Notes (Signed)
10/12/2018 1315 Received pt to room 4E-05 from PACU.  Pt is a S/P L CEA.  No C/O voiced.  Neuro intact.  Tele monitor applied and CCMD notified.  CHG bath given.  Oriented to room, call light nad bed.  Call bell in reach, family at bedside.   Kathryne Hitch

## 2018-10-12 NOTE — Transfer of Care (Signed)
Immediate Anesthesia Transfer of Care Note  Patient: Ethan Estrada  Procedure(s) Performed: ENDARTERECTOMY CAROTID LEFT (Left Neck)  Patient Location: PACU  Anesthesia Type:General  Level of Consciousness: awake, alert  and oriented  Airway & Oxygen Therapy: Patient Spontanous Breathing and Patient connected to face mask oxygen  Post-op Assessment: Report given to RN and Post -op Vital signs reviewed and stable  Post vital signs: Reviewed and stable  Last Vitals:  Vitals Value Taken Time  BP 115/67 10/12/2018 10:08 AM  Temp    Pulse 87 10/12/2018 10:10 AM  Resp 14 10/12/2018 10:10 AM  SpO2 93 % 10/12/2018 10:10 AM  Vitals shown include unvalidated device data.  Last Pain:  Vitals:   10/12/18 0631  TempSrc:   PainSc: 0-No pain      Patients Stated Pain Goal: 4 (10/12/18 0631)  Complications: No apparent anesthesia complications

## 2018-10-12 NOTE — Telephone Encounter (Signed)
sch appt lvm mld ltr 10/31/2018 1030am p/o MD

## 2018-10-12 NOTE — Anesthesia Procedure Notes (Signed)
Procedure Name: Intubation Date/Time: 10/12/2018 7:40 AM Performed by: Caren Macadam, CRNA Pre-anesthesia Checklist: Patient identified, Emergency Drugs available, Suction available and Patient being monitored Patient Re-evaluated:Patient Re-evaluated prior to induction Oxygen Delivery Method: Circle system utilized Preoxygenation: Pre-oxygenation with 100% oxygen Induction Type: IV induction Ventilation: Mask ventilation without difficulty Laryngoscope Size: Miller and 2 Grade View: Grade I Tube type: Oral Tube size: 7.5 mm Number of attempts: 1 Airway Equipment and Method: Stylet and Oral airway Placement Confirmation: ETT inserted through vocal cords under direct vision,  positive ETCO2 and breath sounds checked- equal and bilateral Secured at: 24 cm Tube secured with: Tape Dental Injury: Teeth and Oropharynx as per pre-operative assessment

## 2018-10-12 NOTE — Op Note (Signed)
    OPERATIVE REPORT  DATE OF SURGERY: 10/12/2018  PATIENT: Ethan Estrada, 73 y.o. male MRN: [id number]  DOB: May 24, 1946  PRE-OPERATIVE DIAGNOSIS: Severe asymptomatic left carotid stenosis  POST-OPERATIVE DIAGNOSIS:  Same  PROCEDURE: Left carotid endarterectomy and Dacron patch angioplasty  SURGEON:  Gretta Began, M.D.  PHYSICIAN ASSISTANT: Samantha Rhyne , PA-C  ANESTHESIA: General  EBL: per anesthesia record  Total I/O In: 1300 [I.V.:1300] Out: 25 [Blood:25]  BLOOD ADMINISTERED: none  DRAINS: none  SPECIMEN: none  COUNTS CORRECT:  YES  PATIENT DISPOSITION:  PACU - hemodynamically stable  PROCEDURE DETAILS: Patient was taken to the operating placed supine position where the area of the left neck was prepped and draped in usual sterile fashion.  Incision was made anterior to the sternocleidomastoid and carried down through the platysma with electrocautery.  The sternocleidomastoid reflected posteriorly and the carotid sheath was opened.  Facial vein was ligated with 2-0 silk ties and divided.  The common carotid artery was encircled with an umbilical tape and Rummel tourniquet.  There was thickening in the midportion of the common carotid artery in the neck.  Dissection was continued onto the bifurcation.  The superior thyroid artery was encircled with a 2-0 silk Potts tie.  The internal carotid was encircled with an umbilical tape and Rummel tourniquet and the external carotid was encircled with a blue vessel loop.  The vagus and hypoglossal nerves were identified and preserved.  The patient was given 9000 units of intravenous heparin and after adequate circulation time, the internal and external and common carotid arteries were occluded.  The common carotid artery was opened with an 11 blade and extended longitudinally with Potts scissors through the plaque onto the internal carotid artery.  A 10 shunt was passed up the internal carotid, allowed to backbleed and then down the  common carotid where secured with Rummel tourniquet's.  The endarterectomy was begun on the common carotid artery and the plaque was divided proximally with Potts scissors.  The endarterectomy was continued onto the bifurcation.  The external carotid was endarterectomized in an eversion technique and the internal carotid was endarterectomized in an open fashion.  Remaining atheromatous debris was removed from the endarterectomy plane.  A Finesse Hemashield Dacron patch was brought onto the field and was sewn as a patch angioplasty with a running 6-0 Prolene suture.  Prior to completion of the closure the shunt was removed and the usual flushing maneuvers were undertaken.  The anastomosis was completed and flow was restored first to the external and then the internal carotid artery.  Excellent flow characteristics were noted with hand-held Doppler in the internal and external carotid arteries.  The patient was given 50 mg of protamine to reverse the heparin.  The wounds were irrigated with saline.  Hemostasis obtained after cautery.  The wounds were closed with 3-0 Vicryl to reapproximate the sternocleidomastoid over the carotid sheath.  Next the platysma was closed with a running 3-0 Vicryl suture and finally the skin was closed with a 4-0 subcuticular Vicryl stitch.  Dermabond was applied to the wound.  The patient was awakened neurologically intact in the operating room and transferred to the recovery room in stable condition   Larina Earthly, M.D., [facility] 10/12/2018 10:18 AM

## 2018-10-12 NOTE — Discharge Instructions (Signed)
° °  [facility] [place] ° °Discharge Instructions °  °Carotid Endarterectomy (CEA) ° °Please refer to the following instructions for your post-procedure care. Your surgeon or physician assistant will discuss any changes with you. ° °Activity ° °You are encouraged to walk as much as you can. You can slowly return to normal activities but must avoid strenuous activity and heavy lifting until your doctor tell you it's okay. Avoid activities such as vacuuming or swinging a golf club. You can drive after one week if you are comfortable and you are no longer taking prescription pain medications. It is normal to feel tired for serval weeks after your surgery. It is also normal to have difficulty with sleep habits, eating, and bowel movements after surgery. These will go away with time. ° °Bathing/Showering ° °Shower daily after you go home. Do not soak in a bathtub, hot tub, or swim until the incision heals completely. ° °Incision Care ° °Shower every day. Clean your incision with mild soap and water. Pat the area dry with a clean towel. You do not need a bandage unless otherwise instructed. Do not apply any ointments or creams to your incision. You may have skin glue on your incision. Do not peel it off. It will come off on its own in about one week. Your incision may feel thickened and raised for several weeks after your surgery. This is normal and the skin will soften over time.  ° °For Men Only: It's okay to shave around the incision but do not shave the incision itself for 2 weeks. It is common to have numbness under your chin that could last for several months. ° °Diet ° °Resume your normal diet. There are no special food restrictions following this procedure. A low fat/low cholesterol diet is recommended for all patients with vascular disease. In order to heal from your surgery, it is CRITICAL to get adequate nutrition. Your body requires vitamins, minerals, and protein. Vegetables are the  best source of vitamins and minerals. Vegetables also provide the perfect balance of protein. Processed food has little nutritional value, so try to avoid this. ° °Medications ° °Resume taking all of your medications unless your doctor or physician assistant tells you not to. If your incision is causing pain, you may take over-the- counter pain relievers such as acetaminophen (Tylenol). If you were prescribed a stronger pain medication, please be aware these medications can cause nausea and constipation. Prevent nausea by taking the medication with a snack or meal. Avoid constipation by drinking plenty of fluids and eating foods with a high amount of fiber, such as fruits, vegetables, and grains.  °Do not take Tylenol if you are taking prescription pain medications. ° °Follow Up ° °Our office will schedule a follow up appointment 2-3 weeks following discharge. ° °Please call us immediately for any of the following conditions ° °Increased pain, redness, drainage (pus) from your incision site. °Fever of 101 degrees or higher. °If you should develop stroke (slurred speech, difficulty swallowing, weakness on one side of your body, loss of vision) you should call 911 and go to the nearest emergency room. ° °Reduce your risk of vascular disease: ° °Stop smoking. If you would like help call [facility] at 1-800-QUIT-NOW (1-800-784-8669) or [place] at [phone]. °Manage your cholesterol °Maintain a desired weight °Control your diabetes °Keep your blood pressure down ° °If you have any questions, please call the office at [phone]. ° °

## 2018-10-13 ENCOUNTER — Other Ambulatory Visit: Payer: Self-pay

## 2018-10-13 ENCOUNTER — Encounter (HOSPITAL_COMMUNITY): Payer: Self-pay | Admitting: Vascular Surgery

## 2018-10-13 LAB — CBC
HCT: 37.9 % — ABNORMAL LOW (ref 39.0–52.0)
Hemoglobin: 11.4 g/dL — ABNORMAL LOW (ref 13.0–17.0)
MCH: 27.9 pg (ref 26.0–34.0)
MCHC: 30.1 g/dL (ref 30.0–36.0)
MCV: 92.9 fL (ref 80.0–100.0)
Platelets: 143 10*3/uL — ABNORMAL LOW (ref 150–400)
RBC: 4.08 MIL/uL — ABNORMAL LOW (ref 4.22–5.81)
RDW: 15.5 % (ref 11.5–15.5)
WBC: 6.2 10*3/uL (ref 4.0–10.5)
nRBC: 0 % (ref 0.0–0.2)

## 2018-10-13 LAB — BASIC METABOLIC PANEL
Anion gap: 6 (ref 5–15)
BUN: 10 mg/dL (ref 8–23)
CO2: 23 mmol/L (ref 22–32)
Calcium: 8 mg/dL — ABNORMAL LOW (ref 8.9–10.3)
Chloride: 107 mmol/L (ref 98–111)
Creatinine, Ser: 1.14 mg/dL (ref 0.61–1.24)
GFR calc Af Amer: >60 mL/min (ref >60)
GFR calc non Af Amer: >60 mL/min (ref >60)
Glucose, Bld: 89 mg/dL (ref 70–99)
Potassium: 3.8 mmol/L (ref 3.5–5.1)
Sodium: 136 mmol/L (ref 135–145)

## 2018-10-13 MED ORDER — OXYCODONE-ACETAMINOPHEN 5-325 MG PO TABS: 1.0000 | ORAL_TABLET | Freq: Four times a day (QID) | ORAL | 0 refills | Status: DC | PRN

## 2018-10-13 NOTE — Care Management Note (Signed)
Case Management Note Donn Pierini RN, BSN Transitions of Care Unit 4E- RN Case Manager [phone]  Patient Details  Name: Ethan Estrada MRN: [id number] Date of Birth: 20-May-1946  Subjective/Objective:  Pt admitted s/p TMA                Action/Plan: PTA pt lived at home, notified by [facility] with [facility], pt has pre-op referral for any HH needs- they will f/u with pt post discharge.   Expected Discharge Date:  10/13/18               Expected Discharge Plan:  Home w Home Health Services  In-House Referral:  NA  Discharge planning Services  CM Consult  Post Acute Care Choice:  Home Health Choice offered to:  Patient  DME Arranged:    DME Agency:     HH Arranged:    HH Agency:  [facility]  Status of Service:  Completed, signed off  If discussed at Long Length of Stay Meetings, dates discussed:    Discharge Disposition: home/home health   Additional Comments:  Darrold Span, RN 10/13/2018, 3:23 PM

## 2018-10-13 NOTE — Progress Notes (Signed)
Patient discharged.  A-line removed by Maye Hides, RN with patient tolerating procedure well.  Patient voided and atient ambulated in hall 240 ft.  Peripheral IV's removed.  Vital signs obtained and were stable, CCMD called.  Telebox removed and placed in telebox center by Joy's office.  AVS printed and education provided with all questions addressed.  Patient transported by wheelchair to [facility] for discharge.

## 2018-10-13 NOTE — Progress Notes (Signed)
  Progress Note    10/13/2018 7:32 AM 1 Day Post-Op  Subjective:  Says he is ready to go.  No difficulties swallowing just sore throat.  Has not walked.  He has voided.   Afebrile HR 60's-80's NSR 90's-110's systolic 97% RA Vitals:   10/12/18 2132 10/13/18 0400  BP: (!) 91/59 (!) 117/59  Pulse: 77 69  Resp:  10  Temp:  98.7 F (37.1 C)  SpO2:  98%     Physical Exam: Neuro:  In tact; tongue is midline Lungs:  Non labored Incision:  Clean and dry  CBC    Component Value Date/Time   WBC 6.2 10/13/2018 0315   RBC 4.08 (L) 10/13/2018 0315   HGB 11.4 (L) 10/13/2018 0315   HCT 37.9 (L) 10/13/2018 0315   PLT 143 (L) 10/13/2018 0315   MCV 92.9 10/13/2018 0315   MCH 27.9 10/13/2018 0315   MCHC 30.1 10/13/2018 0315   RDW 15.5 10/13/2018 0315    BMET    Component Value Date/Time   NA 136 10/13/2018 0315   NA 142 04/20/2017 0942   K 3.8 10/13/2018 0315   CL 107 10/13/2018 0315   CO2 23 10/13/2018 0315   GLUCOSE 89 10/13/2018 0315   BUN 10 10/13/2018 0315   BUN 9 04/20/2017 0942   CREATININE 1.14 10/13/2018 0315   CALCIUM 8.0 (L) 10/13/2018 0315   GFRNONAA >60 10/13/2018 0315   GFRAA >60 10/13/2018 0315     Intake/Output Summary (Last 24 hours) at 10/13/2018 0732 Last data filed at 10/13/2018 0400 Gross per 24 hour  Intake 2197.31 ml  Output 325 ml  Net 1872.31 ml     Assessment/Plan:  This is a 73 y.o. male who is s/p left CEA 1 Day Post-Op  -pt is doing well this am.  No difficulty swallowing only sore throat.  -dc a line -pt neuro exam is in tact -pt has not ambulated-needs to walk. -pt has voided -f/u with Dr. Arbie Cookey in 2 weeks.   Doreatha Massed, PA-C [facility] [phone]

## 2018-10-13 NOTE — Discharge Summary (Signed)
Discharge Summary     Ethan RutterFloyd Lawry 1945-11-24 73 y.o. male  [id number]  Admission Date: 10/12/2018  Discharge Date: 10/13/2018  Physician: Larina EarthlyEarly, Todd F, MD  Admission Diagnosis: LEFT CAROTID ARTERY STENOSIS   HPI:   This is a 73 y.o. male who is here today for evaluation of left carotid stenosis.  He is here today with his wife.  He is a very pleasant right-handed gentleman who reports that he is had follow-up of carotid disease since the late 1990s.  He has had progression of this and [facility] showing progression to severe stenosis in the left internal carotid artery.  Fortunately he denies any prior history of amaurosis fugax, a aphasia, transient ischemic attack or stroke.  Does have significant coronary disease.  And undergone coronary angioplasty in the 1990s and subsequently underwent coronary artery bypass grafting.  Early 2000's.  He had a nuclear cardiac scan in August 2018 showing 50% ejection fraction and old scar but no evidence of ischemia.  He remains quite active.  He does have some lower extremity edema but no claudication type symptoms.  He is a long-term cigarette smoker and active cigarette smoker  Hospital Course:  The patient was admitted to the hospital and taken to the operating room on 10/12/2018 and underwent left carotid endarterectomy.    The pt tolerated the procedure well and was transported to the PACU in good condition.   By POD 1, the pt neuro status was in tact.  He did not have any difficulty swallowing, only a sore throat.  He ambulated and was voiding.  He was discharged home.   The remainder of the hospital course consisted of increasing mobilization and increasing intake of solids without difficulty.   Recent Labs    10/13/18 0315  NA 136  K 3.8  CL 107  CO2 23  GLUCOSE 89  BUN 10  CALCIUM 8.0*   Recent Labs    10/13/18 0315  WBC 6.2  HGB 11.4*  HCT 37.9*  PLT 143*   No results for input(s): INR in  the last 72 hours.   Discharge Instructions    Discharge patient   Complete by:  As directed    Discharge pt after Dr. Arbie CookeyEarly has seen pt, he has walked and a line removed and he has eaten breakfast.  Thanks   Discharge disposition:  01-Home or Self Care   Discharge patient date:  10/13/2018      Discharge Diagnosis:  LEFT CAROTID ARTERY STENOSIS  Secondary Diagnosis: Patient Active Problem List   Diagnosis Date Noted  . Asymptomatic carotid artery stenosis without infarction, left 10/12/2018  . Stenosis of left carotid artery 07/28/2018  . Insomnia 07/28/2018  . TOBACCO ABUSE 07/15/2010  . CAD, ARTERY BYPASS GRAFT 02/19/2009  . HYPERLIPIDEMIA-MIXED 02/18/2009  . MI 02/18/2009  . Coronary atherosclerosis 02/18/2009  . Cerebrovascular disease 02/18/2009  . WEAKNESS 02/18/2009  . DIAPHORESIS 02/18/2009   Past Medical History:  Diagnosis Date  . Acute myocardial infarction, unspecified site, episode of care unspecified 1985  . CAD (coronary artery disease)   . Cerebrovascular disease, unspecified   . Coronary atherosclerosis of unspecified type of vessel, native or graft   . GERD (gastroesophageal reflux disease)   . History of heart bypass surgery   . MI (myocardial infarction) (HCC)   . Other and unspecified hyperlipidemia   . Renal hematoma 2005    Allergies as of 10/13/2018   No Known Allergies  Medication List    TAKE these medications   atorvastatin 80 MG tablet Commonly known as:  LIPITOR TAKE 1 TABLET DAILY AT 6PM. What changed:    how much to take  how to take this  when to take this  additional instructions   BAYER ASPIRIN 81 MG tablet Generic drug:  aspirin Take 1 tablet (81 mg total) by mouth daily.   metoprolol tartrate 50 MG tablet Commonly known as:  LOPRESSOR TAKE 1 TABLET TWO TIMES DAILY. What changed:    how much to take  how to take this  when to take this  additional instructions   omeprazole 10 MG capsule Commonly  known as:  PRILOSEC Take 10 mg by mouth daily.   oxyCODONE-acetaminophen 5-325 MG tablet Commonly known as:  PERCOCET/ROXICET Take 1 tablet by mouth every 6 (six) hours as needed for moderate pain.   traZODone 50 MG tablet Commonly known as:  DESYREL Take 0.5-1 tablets (25-50 mg total) by mouth at bedtime as needed for sleep.        [facility] Discharge Instructions Carotid Endarterectomy (CEA)  Please refer to the following instructions for your post-procedure care. Your surgeon or physician assistant will discuss any changes with you.  Activity  You are encouraged to walk as much as you can. You can slowly return to normal activities but must avoid strenuous activity and heavy lifting until your doctor tell you it's OK. Avoid activities such as vacuuming or swinging a golf club. You can drive after one week if you are comfortable and you are no longer taking prescription pain medications. It is normal to feel tired for serval weeks after your surgery. It is also normal to have difficulty with sleep habits, eating, and bowel movements after surgery. These will go away with time.  Bathing/Showering  You may shower after you come home. Do not soak in a bathtub, hot tub, or swim until the incision heals completely.  Incision Care  Shower every day. Clean your incision with mild soap and water. Pat the area dry with a clean towel. You do not need a bandage unless otherwise instructed. Do not apply any ointments or creams to your incision. You may have skin glue on your incision. Do not peel it off. It will come off on its own in about one week. Your incision may feel thickened and raised for several weeks after your surgery. This is normal and the skin will soften over time. For Men Only: It's OK to shave around the incision but do not shave the incision itself for 2 weeks. It is common to have numbness under your chin that could last for several  months.  Diet  Resume your normal diet. There are no special food restrictions following this procedure. A low fat/low cholesterol diet is recommended for all patients with vascular disease. In order to heal from your surgery, it is CRITICAL to get adequate nutrition. Your body requires vitamins, minerals, and protein. Vegetables are the best source of vitamins and minerals. Vegetables also provide the perfect balance of protein. Processed food has little nutritional value, so try to avoid this.  Medications  Resume taking all of your medications unless your doctor or physician assistant tells you not to.  If your incision is causing pain, you may take over-the- counter pain relievers such as acetaminophen (Tylenol). If you were prescribed a stronger pain medication, please be aware these medications can cause nausea and constipation.  Prevent nausea by  taking the medication with a snack or meal. Avoid constipation by drinking plenty of fluids and eating foods with a high amount of fiber, such as fruits, vegetables, and grains.  Do not take Tylenol if you are taking prescription pain medications.  Follow Up  Our office will schedule a follow up appointment 2-3 weeks following discharge.  Please call us immediately for any of the following conditions  . Increased pain, redness, drainage (pus) from your incision site. . Fever of 101 degrees or higher. . If you should develop stroke (slurred speech, difficulty swallowing, weakness on one side of your body, loss of vision) you should call 911 and go to the nearest emergency room. .  Reduce your risk of vascular disease:  . Stop smoking. If you would like help call [facility] at 1-800-QUIT-NOW (650-283-2005) or [place] at [phone]. . Manage your cholesterol . Maintain a desired weight . Control your diabetes . Keep your blood pressure down .  If you have any questions, please call the office at [phone].  Prescriptions  given: 1.   Roxicet #8 No Refill  Disposition: home  Patient's condition: is Good  Follow up: 1. Dr. Arbie Cookey in 2 weeks.   Doreatha Massed, PA-C [facility] [phone]   --- For [facility] use ---   Modified Rankin score at D/C (0-6): 0  IV medication needed for:  1. Hypertension: No 2. Hypotension: No  Post-op Complications: No  1. Post-op CVA or TIA: No  If yes: Event classification (right eye, left eye, right cortical, left cortical, verterobasilar, other): n/a  If yes: Timing of event (intra-op, <6 hrs post-op, >=6 hrs post-op, unknown): n/a  2. CN injury: No  If yes: CN n/a injuried   3. Myocardial infarction: No  If yes: Dx by (EKG or clinical, Troponin): n/a  4.  CHF: No  5.  Dysrhythmia (new): No  6. Wound infection: No  7. Reperfusion symptoms: No  8. Return to OR: No  If yes: return to OR for (bleeding, neurologic, other CEA incision, other): n/a  Discharge medications: Statin use:  Yes ASA use:  Yes   Beta blocker use:  Yes ACE-Inhibitor use:  No  ARB use:  No CCB use: No P2Y12 Antagonist use: No, [ ]  Plavix, [ ]  Plasugrel, [ ]  Ticlopinine, [ ]  Ticagrelor, [ ]  Other, [ ]  No for medical reason, [ ]  Non-compliant, [ ]  Not-indicated Anti-coagulant use:  No, [ ]  Warfarin, [ ]  Rivaroxaban, [ ]  Dabigatran,

## 2018-10-31 ENCOUNTER — Encounter: Payer: Self-pay | Admitting: Vascular Surgery

## 2018-10-31 ENCOUNTER — Other Ambulatory Visit: Payer: Self-pay

## 2018-10-31 ENCOUNTER — Ambulatory Visit (INDEPENDENT_AMBULATORY_CARE_PROVIDER_SITE_OTHER): Payer: Medicare HMO | Admitting: Vascular Surgery

## 2018-10-31 VITALS — BP 126/80 | HR 77 | Temp 99.3°F | Resp 18 | Ht 66.0 in | Wt 210.0 lb

## 2018-10-31 DIAGNOSIS — I6522 Occlusion and stenosis of left carotid artery: Secondary | ICD-10-CM

## 2018-10-31 NOTE — Progress Notes (Signed)
   Patient name: Ethan Estrada MRN: [id number] DOB: 27-Dec-1945 Sex: male  REASON FOR VISIT: Follow-up left carotid endarterectomy on 10/12/2018  HPI: Ethan Estrada is a 73 y.o. male here today for follow-up.  He did well with surgery and was discharged home on postoperative day #1.  He had the usual amount of postoperative swelling.  He has had no neurologic deficits.  Current Outpatient Medications  Medication Sig Dispense Refill  . aspirin (BAYER ASPIRIN) 81 MG tablet Take 1 tablet (81 mg total) by mouth daily.    Marland Kitchen atorvastatin (LIPITOR) 80 MG tablet TAKE 1 TABLET DAILY AT 6PM. (Patient taking differently: Take 80 mg by mouth daily at 6 PM. ) 90 tablet 3  . metoprolol tartrate (LOPRESSOR) 50 MG tablet TAKE 1 TABLET TWO TIMES DAILY. (Patient taking differently: Take 50 mg by mouth 2 (two) times daily. ) 180 tablet 3  . omeprazole (PRILOSEC) 10 MG capsule Take 10 mg by mouth daily.     . traZODone (DESYREL) 50 MG tablet Take 0.5-1 tablets (25-50 mg total) by mouth at bedtime as needed for sleep. 90 tablet 1  . oxyCODONE-acetaminophen (PERCOCET/ROXICET) 5-325 MG tablet Take 1 tablet by mouth every 6 (six) hours as needed for moderate pain. (Patient not taking: Reported on 10/31/2018) 8 tablet 0   No current facility-administered medications for this visit.      PHYSICAL EXAM: Vitals:   10/31/18 0932 10/31/18 0934  BP: 132/82 126/80  Pulse: 77   Resp: 18   Temp: 99.3 F (37.4 C)   TempSrc: Oral   SpO2: 96%   Weight: 210 lb (95.3 kg)   Height: 5\' 6"  (1.676 m)     GENERAL: The patient is a well-nourished male, in no acute distress. The vital signs are documented above. Left neck incision is well-healed.  No bruits bilaterally.  2+ radial pulses bilaterally Neurologically grossly intact  MEDICAL ISSUES: Stable overall following endarterectomy.  Will resume full activities without limitation.  We will see him again in 9 months with repeat carotid  duplex.  He will notify should he develop any wound problems or neurologic deficits   Larina Earthly, MD [facility] Tel [phone] Pager [phone]

## 2018-12-11 ENCOUNTER — Other Ambulatory Visit: Payer: Self-pay | Admitting: Family Medicine

## 2018-12-11 DIAGNOSIS — G4700 Insomnia, unspecified: Secondary | ICD-10-CM

## 2019-04-06 ENCOUNTER — Other Ambulatory Visit: Payer: Self-pay | Admitting: Cardiology

## 2019-04-27 ENCOUNTER — Other Ambulatory Visit: Payer: Self-pay | Admitting: *Deleted

## 2019-04-27 DIAGNOSIS — G4700 Insomnia, unspecified: Secondary | ICD-10-CM

## 2019-04-27 MED ORDER — TRAZODONE HCL 50 MG PO TABS: ORAL_TABLET | ORAL | 1 refills | Status: DC

## 2019-05-09 ENCOUNTER — Encounter: Payer: Medicare HMO | Admitting: Family Medicine

## 2019-06-25 NOTE — Progress Notes (Signed)
HPI: FU coronary disease. The patient underwent coronary artery bypass and graft in [place] in February of 2010 (LIMA to the LAD, saphenous vein graft to the circumflex, and free RIMA to the ramus). Note an echocardiogram in February of 2010 showed an ejection fraction of 50-55%.Abdominal ultrasound August 2018 showed no aneurysm.  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  Nuclear study August 2018 showed ejection fraction 50%.  There was prior infarct but no ischemia.  Carotid Dopplers October 2019 showed 60 to 79% right and 80 to 99% left stenosis.  Had left carotid endarterectomy in January 2020.  Since he was last seen,he has some dyspnea on exertion but no orthopnea, PND, pedal edema, chest pain or syncope.  Current Outpatient Medications  Medication Sig Dispense Refill  . aspirin (BAYER ASPIRIN) 81 MG tablet Take 1 tablet (81 mg total) by mouth daily.    Marland Kitchen atorvastatin (LIPITOR) 80 MG tablet TAKE 1 TABLET DAILY AT 6PM. 90 tablet 3  . metoprolol tartrate (LOPRESSOR) 50 MG tablet TAKE 1 TABLET TWICE DAILY 180 tablet 3  . omeprazole (PRILOSEC) 10 MG capsule Take 10 mg by mouth daily.      No current facility-administered medications for this visit.      Past Medical History:  Diagnosis Date  . Acute myocardial infarction, unspecified site, episode of care unspecified 1985  . CAD (coronary artery disease)   . Cerebrovascular disease, unspecified   . Coronary atherosclerosis of unspecified type of vessel, native or graft   . GERD (gastroesophageal reflux disease)   . History of heart bypass surgery   . MI (myocardial infarction) ([place])   . Other and unspecified hyperlipidemia   . Renal hematoma 2005    Past Surgical History:  Procedure Laterality Date  . COLONOSCOPY WITH ESOPHAGOGASTRODUODENOSCOPY (EGD)    . CORONARY ARTERY BYPASS GRAFT     S/P in February of 2010. Procedure performed in [place]  . ENDARTERECTOMY Left 10/12/2018   Procedure: ENDARTERECTOMY  CAROTID LEFT;  Surgeon: Rosetta Posner, MD;  Location: [facility];  Service: Vascular;  Laterality: Left;  . EYE SURGERY Left    cataracts  . HEMORRHOID SURGERY    . NECK SURGERY    . TONSILLECTOMY      Social History   Socioeconomic History  . Marital status: Married    Spouse name: Not on file  . Number of children: Not on file  . Years of education: Not on file  . Highest education level: Not on file  Occupational History  . Occupation: Full Time  Social Needs  . Financial resource strain: Not on file  . Food insecurity    Worry: Not on file    Inability: Not on file  . Transportation needs    Medical: Not on file    Non-medical: Not on file  Tobacco Use  . Smoking status: Current Every Day Smoker    Packs/day: 2.00    Years: 50.00    Pack years: 100.00    Types: Cigars    Last attempt to quit: 10/21/2008    Years since quitting: 10.7  . Smokeless tobacco: Former Systems developer    Quit date: 10/03/1983  . Tobacco comment: Now smoking cigars  Substance and Sexual Activity  . Alcohol use: Yes    Comment: occasionally  . Drug use: No  . Sexual activity: Not on file  Lifestyle  . Physical activity    Days per week: Not on file    Minutes  per session: Not on file  . Stress: Not on file  Relationships  . Social Musician on phone: Not on file    Gets together: Not on file    Attends religious service: Not on file    Active member of club or organization: Not on file    Attends meetings of clubs or organizations: Not on file    Relationship status: Not on file  . Intimate partner violence    Fear of current or ex partner: Not on file    Emotionally abused: Not on file    Physically abused: Not on file    Forced sexual activity: Not on file  Other Topics Concern  . Not on file  Social History Narrative   No Regular Exercise    Family History  Problem Relation Age of Onset  . Cancer Mother 35       Leukemia  . Coronary artery disease Father 42       MI     ROS: no fevers or chills, productive cough, hemoptysis, dysphasia, odynophagia, melena, hematochezia, dysuria, hematuria, rash, seizure activity, orthopnea, PND, pedal edema, claudication. Remaining systems are negative.  Physical Exam: Well-developed obese in no acute distress.  Skin is warm and dry.  HEENT is normal.  Neck is supple.  Chest is clear to auscultation with normal expansion.  Cardiovascular exam is regular rate and rhythm.  Abdominal exam nontender or distended. No masses palpated. Extremities show no edema. neuro grossly intact  ECG-sinus rhythm at a rate of 84, nonspecific ST changes, first-degree AV block.  Personally reviewed  A/P  1 coronary artery disease-patient denies recurrent chest pain.  Continue medical therapy with aspirin and statin.  2 carotid artery disease-Continue aspirin and statin.  3 Hyperlipidemia-continue statin.  Check lipids and liver.  4 tobacco abuse-patient counseled on discontinuing.  Olga Millers, MD

## 2019-07-04 ENCOUNTER — Encounter: Payer: Self-pay | Admitting: Cardiology

## 2019-07-04 ENCOUNTER — Ambulatory Visit (INDEPENDENT_AMBULATORY_CARE_PROVIDER_SITE_OTHER): Payer: Medicare HMO | Admitting: Cardiology

## 2019-07-04 ENCOUNTER — Other Ambulatory Visit: Payer: Self-pay

## 2019-07-04 VITALS — BP 140/82 | HR 84 | Ht 66.0 in | Wt 209.0 lb

## 2019-07-04 DIAGNOSIS — I679 Cerebrovascular disease, unspecified: Secondary | ICD-10-CM

## 2019-07-04 DIAGNOSIS — F17200 Nicotine dependence, unspecified, uncomplicated: Secondary | ICD-10-CM

## 2019-07-04 DIAGNOSIS — E7800 Pure hypercholesterolemia, unspecified: Secondary | ICD-10-CM

## 2019-07-04 DIAGNOSIS — I2510 Atherosclerotic heart disease of native coronary artery without angina pectoris: Secondary | ICD-10-CM

## 2019-07-04 NOTE — Patient Instructions (Signed)
Medication Instructions:  NO CHANGE If you need a refill on your cardiac medications before your next appointment, please call your pharmacy.   Lab work: Your physician recommends that you return for lab work PRIOR TO EATING  If you have labs (blood work) drawn today and your tests are completely normal, you will receive your results only by: Marland Kitchen MyChart Message (if you have MyChart) OR . A paper copy in the mail If you have any lab test that is abnormal or we need to change your treatment, we will call you to review the results.  Follow-Up: At [facility], you and your health needs are our priority.  As part of our continuing mission to provide you with exceptional heart care, we have created designated Provider Care Teams.  These Care Teams include your primary Cardiologist (physician) and Advanced Practice Providers (APPs -  Physician Assistants and Nurse Practitioners) who all work together to provide you with the care you need, when you need it. Your physician wants you to follow-up in: [place] will receive a reminder letter in the mail two months in advance. If you don't receive a letter, please call our office to schedule the follow-up appointment.

## 2019-08-24 ENCOUNTER — Other Ambulatory Visit: Payer: Self-pay

## 2019-08-24 DIAGNOSIS — I6523 Occlusion and stenosis of bilateral carotid arteries: Secondary | ICD-10-CM

## 2019-08-28 ENCOUNTER — Encounter: Payer: Self-pay | Admitting: Vascular Surgery

## 2019-08-28 ENCOUNTER — Telehealth (HOSPITAL_COMMUNITY): Payer: Self-pay

## 2019-08-28 ENCOUNTER — Ambulatory Visit (HOSPITAL_COMMUNITY)
Admission: RE | Admit: 2019-08-28 | Discharge: 2019-08-28 | Disposition: A | Discharge status: Home / Self Care | Payer: Medicare HMO | Source: Ambulatory Visit | Attending: Vascular Surgery | Admitting: Vascular Surgery

## 2019-08-28 ENCOUNTER — Ambulatory Visit (INDEPENDENT_AMBULATORY_CARE_PROVIDER_SITE_OTHER): Payer: Medicare HMO | Admitting: Vascular Surgery

## 2019-08-28 ENCOUNTER — Other Ambulatory Visit: Payer: Self-pay

## 2019-08-28 VITALS — BP 141/94 | HR 69 | Temp 97.7°F | Resp 20 | Ht 66.0 in | Wt 211.1 lb

## 2019-08-28 DIAGNOSIS — I6523 Occlusion and stenosis of bilateral carotid arteries: Secondary | ICD-10-CM

## 2019-08-28 NOTE — Progress Notes (Signed)
Vascular and Vein Specialist of [facility]  Patient name: Ethan Estrada MRN: [id number] DOB: 26-Aug-1946 Sex: male  REASON FOR VISIT: Follow-up of left carotid endarterectomy for severe asymptomatic disease on 10/12/2018  HPI: Ethan Estrada is a 73 y.o. male here today for follow-up.  His wife is with him.  He is his usual animated self.  He denies any neurologic deficits.  He has turned to his baseline function and reports no periincisional numbness.  Past Medical History:  Diagnosis Date  . Acute myocardial infarction, unspecified site, episode of care unspecified 1985  . CAD (coronary artery disease)   . Cerebrovascular disease, unspecified   . Coronary atherosclerosis of unspecified type of vessel, native or graft   . GERD (gastroesophageal reflux disease)   . History of heart bypass surgery   . MI (myocardial infarction) ([place])   . Other and unspecified hyperlipidemia   . Renal hematoma 2005    Family History  Problem Relation Age of Onset  . Cancer Mother 15       Leukemia  . Coronary artery disease Father 76       MI    SOCIAL HISTORY: Social History   Tobacco Use  . Smoking status: Current Every Day Smoker    Packs/day: 2.00    Years: 50.00    Pack years: 100.00    Types: Cigars    Last attempt to quit: 10/21/2008    Years since quitting: 10.8  . Smokeless tobacco: Former Systems developer    Quit date: 10/03/1983  . Tobacco comment: Now smoking cigars  Substance Use Topics  . Alcohol use: Yes    Comment: occasionally    No Known Allergies  Current Outpatient Medications  Medication Sig Dispense Refill  . aspirin (BAYER ASPIRIN) 81 MG tablet Take 1 tablet (81 mg total) by mouth daily.    Marland Kitchen atorvastatin (LIPITOR) 80 MG tablet TAKE 1 TABLET DAILY AT 6PM. 90 tablet 3  . metoprolol tartrate (LOPRESSOR) 50 MG tablet TAKE 1 TABLET TWICE DAILY 180 tablet 3  . omeprazole (PRILOSEC) 10 MG capsule Take 10 mg by mouth daily.     . traZODone  (DESYREL) 50 MG tablet      No current facility-administered medications for this visit.     REVIEW OF SYSTEMS:  [X]  denotes positive finding, [ ]  denotes negative finding Cardiac  Comments:  Chest pain or chest pressure:    Shortness of breath upon exertion:    Short of breath when lying flat:    Irregular heart rhythm:        Vascular    Pain in calf, thigh, or hip brought on by ambulation:    Pain in feet at night that wakes you up from your sleep:     Blood clot in your veins:    Leg swelling:           PHYSICAL EXAM: Vitals:   08/28/19 1507 08/28/19 1510  BP: (!) 154/72 (!) 141/94  Pulse: 69   Resp: 20   Temp: 97.7 F (36.5 C)   SpO2: 95%   Weight: 211 lb 1.6 oz (95.8 kg)   Height: 5\' 6"  (1.676 m)     GENERAL: The patient is a well-nourished male, in no acute distress. The vital signs are documented above. CARDIOVASCULAR: Left neck incision is well-healed with no bruit.  He has no right neck bruit.  Radial pulses are 2+ bilaterally PULMONARY: There is good air exchange  MUSCULOSKELETAL: There are no major deformities or  cyanosis. NEUROLOGIC: No focal weakness or paresthesias are detected. SKIN: There are no ulcers or rashes noted. PSYCHIATRIC: The patient has a normal affect.  DATA:  He underwent carotid duplex in our office today.  I reviewed this and discussed at length with the patient and his wife.  He has no evidence of stenosis in his right carotid artery.  In his endarterectomy site there is some tortuosity and elevated velocities that would suggest 40 to 59% stenosis at the lower end of this.  In reviewing his images, these velocities are obtained in areas of tortuosity in this very well may be elevated.  MEDICAL ISSUES: Stable status post carotid endarterectomy.  Slightly elevated velocities in his left endarterectomy site.  I explained to the patient this puts him at no increased risk for stroke.  Would recommend a standard repeat carotid duplex in 1 year  for follow-up    Larina Earthly, MD [facility] Ve[facility] Specialists of [facility] Tel [phone] Pager [phone]

## 2019-08-28 NOTE — Telephone Encounter (Signed)

## 2019-09-12 ENCOUNTER — Other Ambulatory Visit: Payer: Self-pay | Admitting: Family Medicine

## 2019-09-18 ENCOUNTER — Other Ambulatory Visit: Payer: Self-pay | Admitting: *Deleted

## 2019-09-18 DIAGNOSIS — I6523 Occlusion and stenosis of bilateral carotid arteries: Secondary | ICD-10-CM

## 2019-12-05 ENCOUNTER — Ambulatory Visit: Payer: Medicare HMO | Admitting: Family Medicine

## 2019-12-06 NOTE — Progress Notes (Signed)
Nurse connected with patient 12/07/19 at  2:30 PM EDT by a telephone enabled telemedicine application and verified that I am speaking with the correct person using two identifiers. Patient stated full name and DOB. Patient gave permission to continue with virtual visit. Patient's location was at home and Nurse's location was at [place] office.   Subjective:   Ethan Estrada is a 74 y.o. male who presents for an Initial Medicare Annual Wellness Visit.  Review of Systems  Home Safety/Smoke Alarms: Feels safe in home. Smoke alarms in place.  Lives w/ wife in 3 story home. Does well with stairs.   Male:   CCS- pt reports last done 4 yrs ago. Normal per pt.     PSA- No results found for: PSA     Objective:      Advanced Directives 12/07/2019 08/28/2019 10/13/2018 10/12/2018 10/02/2018  Does Patient Have a Medical Advance Directive? Yes Yes Yes Yes Yes  Type of Estate agent of [place];Living will Healthcare Power of [place];Living will Healthcare Power of [place];Living will Healthcare Power of [place];Living will Healthcare Power of [place];Living will  Does patient want to make changes to medical advance directive? No - Patient declined No - Patient declined No - Patient declined - -  Copy of Healthcare Power of Attorney in Chart? No - copy requested - - No - copy requested No - copy requested    Current Medications (verified) Outpatient Encounter Medications as of 12/07/2019  Medication Sig  . aspirin (BAYER ASPIRIN) 81 MG tablet Take 1 tablet (81 mg total) by mouth daily.  Marland Kitchen atorvastatin (LIPITOR) 80 MG tablet TAKE 1 TABLET DAILY AT 6PM.  . metoprolol tartrate (LOPRESSOR) 50 MG tablet TAKE 1 TABLET TWICE DAILY  . omeprazole (PRILOSEC) 10 MG capsule Take 10 mg by mouth daily.   . traZODone (DESYREL) 50 MG tablet Take 1/2 to 1 tablet at bedtime as needed for sleep.  Needs ov before any more refills. (Patient not taking: Reported on 12/07/2019)   No  facility-administered encounter medications on file as of 12/07/2019.    Allergies (verified) Patient has no known allergies.   History: Past Medical History:  Diagnosis Date  . Acute myocardial infarction, unspecified site, episode of care unspecified 1985  . CAD (coronary artery disease)   . Cerebrovascular disease, unspecified   . Coronary atherosclerosis of unspecified type of vessel, native or graft   . GERD (gastroesophageal reflux disease)   . History of heart bypass surgery   . MI (myocardial infarction) ([facility])   . Other and unspecified hyperlipidemia   . Renal hematoma 2005   Past Surgical History:  Procedure Laterality Date  . COLONOSCOPY WITH ESOPHAGOGASTRODUODENOSCOPY (EGD)    . CORONARY ARTERY BYPASS GRAFT     S/P in February of 2010. Procedure performed in [place]  . ENDARTERECTOMY Left 10/12/2018   Procedure: ENDARTERECTOMY CAROTID LEFT;  Surgeon: Larina Earthly, MD;  Location: [facility] OR;  Service: Vascular;  Laterality: Left;  . EYE SURGERY Left    cataracts  . HEMORRHOID SURGERY    . NECK SURGERY    . TONSILLECTOMY     Family History  Problem Relation Age of Onset  . Cancer Mother 23       Leukemia  . Coronary artery disease Father 4       MI   Social History   Socioeconomic History  . Marital status: Married    Spouse name: Not on file  . Number of children: Not on file  .  Years of education: Not on file  . Highest education level: Not on file  Occupational History  . Occupation: Full Time  Tobacco Use  . Smoking status: Current Every Day Smoker    Packs/day: 2.00    Years: 50.00    Pack years: 100.00    Types: Cigars    Last attempt to quit: 10/21/2008    Years since quitting: 11.1  . Smokeless tobacco: Former Systems developer    Quit date: 10/03/1983  . Tobacco comment: Now smoking cigars  Substance and Sexual Activity  . Alcohol use: Yes    Comment: occasionally  . Drug use: No  . Sexual activity: Not on file  Other Topics Concern  . Not on file   Social History Narrative   No Regular Exercise   Social Determinants of Health   Financial Resource Strain:   . Difficulty of Paying Living Expenses:   Food Insecurity:   . Worried About Charity fundraiser in the Last Year:   . Arboriculturist in the Last Year:   Transportation Needs:   . Film/video editor (Medical):   Marland Kitchen Lack of Transportation (Non-Medical):   Physical Activity:   . Days of Exercise per Week:   . Minutes of Exercise per Session:   Stress:   . Feeling of Stress :   Social Connections:   . Frequency of Communication with Friends and Family:   . Frequency of Social Gatherings with Friends and Family:   . Attends Religious Services:   . Active Member of Clubs or Organizations:   . Attends Archivist Meetings:   Marland Kitchen Marital Status:    Tobacco Counseling Ready to quit: Not Answered Counseling given: Not Answered Comment: Now smoking cigars   Clinical Intake: Pain : No/denies pain   Activities of Daily Living In your present state of health, do you have any difficulty performing the following activities: 12/07/2019  Hearing? N  Vision? N  Difficulty concentrating or making decisions? N  Walking or climbing stairs? N  Dressing or bathing? N  Doing errands, shopping? N  Preparing Food and eating ? N  Using the Toilet? N  In the past six months, have you accidently leaked urine? N  Do you have problems with loss of bowel control? N  Managing your Medications? N  Managing your Finances? N  Housekeeping or managing your Housekeeping? N  Some recent data might be hidden     Immunizations and Health Maintenance Immunization History  Administered Date(s) Administered  . Influenza, High Dose Seasonal PF 06/29/2018, 07/04/2019   Health Maintenance Due  Topic Date Due  . Hepatitis C Screening  Never done  . TETANUS/TDAP  Never done  . COLONOSCOPY  Never done  . PNA vac Low Risk Adult (1 of 2 - PCV13) Never done    Patient Care Team:  Shelda Pal, DO as PCP - General (Family Medicine) Stanford Breed Denice Bors, MD as PCP - Cardiology (Cardiology)  Indicate any recent Medical Services you may have received from other than [facility] providers in the past year (date may be approximate).    Assessment:   This is a routine wellness examination for [place]. Physical assessment deferred to PCP.  Hearing/Vision screen Unable to assess. This visit is enabled though telemedicine due to Covid 19.   Dietary issues and exercise activities discussed: Current Exercise Habits: The patient does not participate in regular exercise at present, Exercise limited by: None identified Diet (meal preparation, eat out, water  intake, caffeinated beverages, dairy products, fruits and vegetables): 24 hr recall Breakfast: skipped Lunch: chow mein Dinner: steak, broccoli, sweet potato  Goals    . DIET - INCREASE WATER INTAKE    . Increase physical activity      Depression Screen PHQ 2/9 Scores 12/07/2019  PHQ - 2 Score 0    Fall Risk Fall Risk  12/07/2019 12/07/2019  Falls in the past year? 0 0  Number falls in past yr: 0 0  Injury with Fall? 0 0  Follow up - Education provided;Falls prevention discussed    Cognitive Function: Ad8 score reviewed for issues:  Issues making decisions:no  Less interest in hobbies / activities:no  Repeats questions, stories (family complaining):no  Trouble using ordinary gadgets (microwave, computer, phone):no  Forgets the month or year: no  Mismanaging finances: no  Remembering appts:no  Daily problems with thinking and/or memory:no Ad8 score is=0         Screening Tests Health Maintenance  Topic Date Due  . Hepatitis C Screening  Never done  . TETANUS/TDAP  Never done  . COLONOSCOPY  Never done  . PNA vac Low Risk Adult (1 of 2 - PCV13) Never done  . INFLUENZA VACCINE  Completed       Plan:    Please schedule your next medicare wellness visit with me in 1 yr.  Eat heart healthy  diet (full of fruits, vegetables, whole grains, lean protein, water--limit salt, fat, and sugar intake) and increase physical activity as tolerated.  Continue doing brain stimulating activities (puzzles, reading, adult coloring books, staying active) to keep memory sharp.   Bring a copy of your living will and/or healthcare power of attorney to your next office visit.   I have personally reviewed and noted the following in the patient's chart:   . Medical and social history . Use of alcohol, tobacco or illicit drugs  . Current medications and supplements . Functional ability and status . Nutritional status . Physical activity . Advanced directives . List of other physicians . Hospitalizations, surgeries, and ER visits in previous 12 months . Vitals . Screenings to include cognitive, depression, and falls . Referrals and appointments  In addition, I have reviewed and discussed with patient certain preventive protocols, quality metrics, and best practice recommendations. A written personalized care plan for preventive services as well as general preventive health recommendations were provided to patient.     Avon Gully, [place]   12/07/2019

## 2019-12-07 ENCOUNTER — Ambulatory Visit (INDEPENDENT_AMBULATORY_CARE_PROVIDER_SITE_OTHER): Payer: Medicare HMO | Admitting: *Deleted

## 2019-12-07 ENCOUNTER — Other Ambulatory Visit: Payer: Self-pay

## 2019-12-07 ENCOUNTER — Encounter: Payer: Self-pay | Admitting: *Deleted

## 2019-12-07 DIAGNOSIS — Z0000 Encounter for general adult medical examination without abnormal findings: Secondary | ICD-10-CM | POA: Diagnosis not present

## 2019-12-07 NOTE — Patient Instructions (Signed)
Please schedule your next medicare wellness visit with me in 1 yr.  Eat heart healthy diet (full of fruits, vegetables, whole grains, lean protein, water--limit salt, fat, and sugar intake) and increase physical activity as tolerated.  Continue doing brain stimulating activities (puzzles, reading, adult coloring books, staying active) to keep memory sharp.   Bring a copy of your living will and/or healthcare power of attorney to your next office visit.   Ethan Estrada , Thank you for taking time to come for your Medicare Wellness Visit. I appreciate your ongoing commitment to your health goals. Please review the following plan we discussed and let me know if I can assist you in the future.   These are the goals we discussed: Goals    . DIET - INCREASE WATER INTAKE    . Increase physical activity     5 min rule        This is a list of the screening recommended for you and due dates:  Health Maintenance  Topic Date Due  .  Hepatitis C: One time screening is recommended by Center for Disease Control  (CDC) for  adults born from [birth year] through 1967.   Never done  . Tetanus Vaccine  Never done  . Colon Cancer Screening  Never done  . Pneumonia vaccines (1 of 2 - PCV13) Never done  . Flu Shot  Completed    Smoking Tobacco Information, Adult Smoking tobacco can be harmful to your health. Tobacco contains a poisonous (toxic), colorless chemical called nicotine. Nicotine is addictive. It changes the brain and can make it hard to stop smoking. Tobacco also has other toxic chemicals that can hurt your body and raise your risk of many cancers. How can smoking tobacco affect me? Smoking tobacco puts you at risk for:  Cancer. Smoking is most commonly associated with lung cancer, but can also lead to cancer in other parts of the body.  Chronic obstructive pulmonary disease (COPD). This is a long-term lung condition that makes it hard to breathe. It also gets worse over time.  High blood  pressure (hypertension), heart disease, stroke, or heart attack.  Lung infections, such as pneumonia.  Cataracts. This is when the lenses in the eyes become clouded.  Digestive problems. This may include peptic ulcers, heartburn, and gastroesophageal reflux disease (GERD).  Oral health problems, such as gum disease and tooth loss.  Loss of taste and smell. Smoking can affect your appearance by causing:  Wrinkles.  Yellow or stained teeth, fingers, and fingernails. Smoking tobacco can also affect your social life, because:  It may be challenging to find places to smoke when away from home. Many workplaces, [employer], hotels, and public places are tobacco-free.  Smoking is expensive. This is due to the cost of tobacco and the long-term costs of treating health problems from smoking.  Secondhand smoke may affect those around you. Secondhand smoke can cause lung cancer, breathing problems, and heart disease. Children of smokers have a higher risk for: ? Sudden infant death syndrome (SIDS). ? Ear infections. ? Lung infections. If you currently smoke tobacco, quitting now can help you:  Lead a longer and healthier life.  Look, smell, breathe, and feel better over time.  Save money.  Protect others from the harms of secondhand smoke. What actions can I take to prevent health problems? Quit smoking   Do not start smoking. Quit if you already do.  Make a plan to quit smoking and commit to it. Look for programs to  help you and ask your health care provider for recommendations and ideas.  Set a date and write down all the reasons you want to quit.  Let your friends and family know you are quitting so they can help and support you. Consider finding friends who also want to quit. It can be easier to quit with someone else, so that you can support each other.  Talk with your health care provider about using nicotine replacement medicines to help you quit, such as gum, lozenges,  patches, sprays, or pills.  Do not replace cigarette smoking with electronic cigarettes, which are commonly called e-cigarettes. The safety of e-cigarettes is not known, and some may contain harmful chemicals.  If you try to quit but return to smoking, stay positive. It is common to slip up when you first quit, so take it one day at a time.  Be prepared for cravings. When you feel the urge to smoke, chew gum or suck on hard candy. Lifestyle  Stay busy and take care of your body.  Drink enough fluid to keep your urine pale yellow.  Get plenty of exercise and eat a healthy diet. This can help prevent weight gain after quitting.  Monitor your eating habits. Quitting smoking can cause you to have a larger appetite than when you smoke.  Find ways to relax. Go out with friends or family to a movie or a restaurant where people do not smoke.  Ask your health care provider about having regular tests (screenings) to check for cancer. This may include blood tests, imaging tests, and other tests.  Find ways to manage your stress, such as meditation, yoga, or exercise. Where to find support To get support to quit smoking, consider:  Asking your health care provider for more information and resources.  Taking classes to learn more about quitting smoking.  Looking for local organizations that offer resources about quitting smoking.  Joining a support group for people who want to quit smoking in your local community.  Calling the smokefree.gov counselor helpline: 1-800-Quit-Now 940-438-3722) Where to find more information You may find more information about quitting smoking from:  HelpGuide.org: www.helpguide.org  https://hall.com/: smokefree.gov  American Lung Association: www.lung.org Contact a health care provider if you:  Have problems breathing.  Notice that your lips, nose, or fingers turn blue.  Have chest pain.  Are coughing up blood.  Feel faint or you pass out.  Have  other health changes that cause you to worry. Summary  Smoking tobacco can negatively affect your health, the health of those around you, your finances, and your social life.  Do not start smoking. Quit if you already do. If you need help quitting, ask your health care provider.  Think about joining a support group for people who want to quit smoking in your local community. There are many effective programs that will help you to quit this behavior. This information is not intended to replace advice given to you by your health care provider. Make sure you discuss any questions you have with your health care provider. Document Revised: 06/01/2019 Document Reviewed: 09/21/2016 Elsevier Patient Education  2020 [place] 65 Years and Older, Male Preventive care refers to lifestyle choices and visits with your health care provider that can promote health and wellness. This includes:  A yearly physical exam. This is also called an annual well check.  Regular dental and eye exams.  Immunizations.  Screening for certain conditions.  Healthy lifestyle choices, such as diet  and exercise. What can I expect for my preventive care visit? Physical exam Your health care provider will check:  Height and weight. These may be used to calculate body mass index (BMI), which is a measurement that tells if you are at a healthy weight.  Heart rate and blood pressure.  Your skin for abnormal spots. Counseling Your health care provider may ask you questions about:  Alcohol, tobacco, and drug use.  Emotional well-being.  Home and relationship well-being.  Sexual activity.  Eating habits.  History of falls.  Memory and ability to understand (cognition).  Work and work Statistician. What immunizations do I need?  Influenza (flu) vaccine  This is recommended every year. Tetanus, diphtheria, and pertussis (Tdap) vaccine  You may need a Td booster every 10  years. Varicella (chickenpox) vaccine  You may need this vaccine if you have not already been vaccinated. Zoster (shingles) vaccine  You may need this after age 81. Pneumococcal conjugate (PCV13) vaccine  One dose is recommended after age 54. Pneumococcal polysaccharide (PPSV23) vaccine  One dose is recommended after age 42. Measles, mumps, and rubella (MMR) vaccine  You may need at least one dose of MMR if you were born in 1957 or later. You may also need a second dose. Meningococcal conjugate (MenACWY) vaccine  You may need this if you have certain conditions. Hepatitis A vaccine  You may need this if you have certain conditions or if you travel or work in places where you may be exposed to hepatitis A. Hepatitis B vaccine  You may need this if you have certain conditions or if you travel or work in places where you may be exposed to hepatitis B. Haemophilus influenzae type b (Hib) vaccine  You may need this if you have certain conditions. You may receive vaccines as individual doses or as more than one vaccine together in one shot (combination vaccines). Talk with your health care provider about the risks and benefits of combination vaccines. What tests do I need? Blood tests  Lipid and cholesterol levels. These may be checked every 5 years, or more frequently depending on your overall health.  Hepatitis C test.  Hepatitis B test. Screening  Lung cancer screening. You may have this screening every year starting at age 26 if you have a 30-pack-year history of smoking and currently smoke or have quit within the past 15 years.  Colorectal cancer screening. All adults should have this screening starting at age 26 and continuing until age 53. Your health care provider may recommend screening at age 41 if you are at increased risk. You will have tests every 1-10 years, depending on your results and the type of screening test.  Prostate cancer screening. Recommendations will  vary depending on your family history and other risks.  Diabetes screening. This is done by checking your blood sugar (glucose) after you have not eaten for a while (fasting). You may have this done every 1-3 years.  Abdominal aortic aneurysm (AAA) screening. You may need this if you are a current or former smoker.  Sexually transmitted disease (STD) testing. Follow these instructions at home: Eating and drinking  Eat a diet that includes fresh fruits and vegetables, whole grains, lean protein, and low-fat dairy products. Limit your intake of foods with high amounts of sugar, saturated fats, and salt.  Take vitamin and mineral supplements as recommended by your health care provider.  Do not drink alcohol if your health care provider tells you not to drink.  If  you drink alcohol: ? Limit how much you have to 0-2 drinks a day. ? Be aware of how much alcohol is in your drink. In the [place] one drink equals one 12 oz bottle of beer (355 mL), one 5 oz glass of wine (148 mL), or one 1 oz glass of hard liquor (44 mL). Lifestyle  Take daily care of your teeth and gums.  Stay active. Exercise for at least 30 minutes on 5 or more days each week.  Do not use any products that contain nicotine or tobacco, such as cigarettes, e-cigarettes, and chewing tobacco. If you need help quitting, ask your health care provider.  If you are sexually active, practice safe sex. Use a condom or other form of protection to prevent STIs (sexually transmitted infections).  Talk with your health care provider about taking a low-dose aspirin or statin. What's next?  Visit your health care provider once a year for a well check visit.  Ask your health care provider how often you should have your eyes and teeth checked.  Stay up to date on all vaccines. This information is not intended to replace advice given to you by your health care provider. Make sure you discuss any questions you have with your health care  provider. Document Revised: 08/31/2018 Document Reviewed: 08/31/2018 Elsevier Patient Education  2020 [employer].

## 2019-12-11 ENCOUNTER — Encounter: Payer: Self-pay | Admitting: Family Medicine

## 2019-12-11 ENCOUNTER — Ambulatory Visit (INDEPENDENT_AMBULATORY_CARE_PROVIDER_SITE_OTHER): Payer: Medicare HMO | Admitting: Family Medicine

## 2019-12-11 ENCOUNTER — Other Ambulatory Visit: Payer: Self-pay

## 2019-12-11 ENCOUNTER — Telehealth: Payer: Self-pay | Admitting: Family Medicine

## 2019-12-11 VITALS — BP 118/72 | HR 77 | Temp 95.7°F | Ht 67.0 in | Wt 209.1 lb

## 2019-12-11 DIAGNOSIS — Z1159 Encounter for screening for other viral diseases: Secondary | ICD-10-CM | POA: Diagnosis not present

## 2019-12-11 DIAGNOSIS — Z23 Encounter for immunization: Secondary | ICD-10-CM

## 2019-12-11 DIAGNOSIS — R351 Nocturia: Secondary | ICD-10-CM

## 2019-12-11 DIAGNOSIS — N529 Male erectile dysfunction, unspecified: Secondary | ICD-10-CM

## 2019-12-11 DIAGNOSIS — Z720 Tobacco use: Secondary | ICD-10-CM

## 2019-12-11 DIAGNOSIS — N401 Enlarged prostate with lower urinary tract symptoms: Secondary | ICD-10-CM | POA: Insufficient documentation

## 2019-12-11 DIAGNOSIS — Z0000 Encounter for general adult medical examination without abnormal findings: Secondary | ICD-10-CM | POA: Diagnosis not present

## 2019-12-11 DIAGNOSIS — G4700 Insomnia, unspecified: Secondary | ICD-10-CM

## 2019-12-11 LAB — COMPREHENSIVE METABOLIC PANEL
ALT: 15 U/L (ref 0–53)
AST: 19 U/L (ref 0–37)
Albumin: 4.1 g/dL (ref 3.5–5.2)
Alkaline Phosphatase: 93 U/L (ref 39–117)
BUN: 14 mg/dL (ref 6–23)
CO2: 28 mEq/L (ref 19–32)
Calcium: 9.2 mg/dL (ref 8.4–10.5)
Chloride: 103 mEq/L (ref 96–112)
Creatinine, Ser: 1.22 mg/dL (ref 0.40–1.50)
GFR: 57.76 mL/min — ABNORMAL LOW (ref >60.00)
Glucose, Bld: 102 mg/dL — ABNORMAL HIGH (ref 70–99)
Potassium: 4.5 mEq/L (ref 3.5–5.1)
Sodium: 138 mEq/L (ref 135–145)
Total Bilirubin: 0.8 mg/dL (ref 0.2–1.2)
Total Protein: 6.7 g/dL (ref 6.0–8.3)

## 2019-12-11 LAB — LIPID PANEL
Cholesterol: 128 mg/dL (ref 0–200)
HDL: 30.8 mg/dL — ABNORMAL LOW (ref >39.00)
LDL Cholesterol: 71 mg/dL (ref 0–99)
NonHDL: 96.86
Total CHOL/HDL Ratio: 4
Triglycerides: 131 mg/dL (ref 0.0–149.0)
VLDL: 26.2 mg/dL (ref 0.0–40.0)

## 2019-12-11 MED ORDER — TADALAFIL 5 MG PO TABS: 5.0000 mg | ORAL_TABLET | Freq: Every day | ORAL | 2 refills | Status: DC

## 2019-12-11 MED ORDER — TRAZODONE HCL 100 MG PO TABS: 100.0000 mg | ORAL_TABLET | Freq: Every evening | ORAL | 0 refills | Status: DC | PRN

## 2019-12-11 NOTE — Progress Notes (Signed)
Chief Complaint  Patient presents with  . Follow-up    Well Male Ethan Estrada is here for a complete physical.   His last physical was >1 year ago.  Current diet: in general, a "healthy" diet.   Current exercise: Staying active in yard, no exercise Weight trend: stable Daytime fatigue? No. Seat belt? Yes.    Pt w hx of insomnia. Trazodone 50 mg qhs was helpful, but less so now. Interested in going up on dosage.   Pt w hx of ED and BPH. Going to restroom 4-5 times per night. Would like to try something. Has not tried anything for either otherwise. No pain or bleeding.   Health maintenance Shingrix- No Colonoscopy- Yes Tetanus- No Hep C- No Pneumonia vaccine- No   Past Medical History:  Diagnosis Date  . Acute myocardial infarction, unspecified site, episode of care unspecified 1985  . CAD (coronary artery disease)   . Cerebrovascular disease, unspecified   . Coronary atherosclerosis of unspecified type of vessel, native or graft   . GERD (gastroesophageal reflux disease)   . History of heart bypass surgery   . MI (myocardial infarction) ([place])   . Other and unspecified hyperlipidemia   . Renal hematoma 2005     Past Surgical History:  Procedure Laterality Date  . COLONOSCOPY WITH ESOPHAGOGASTRODUODENOSCOPY (EGD)    . CORONARY ARTERY BYPASS GRAFT     S/P in February of 2010. Procedure performed in [place]  . ENDARTERECTOMY Left 10/12/2018   Procedure: ENDARTERECTOMY CAROTID LEFT;  Surgeon: Rosetta Posner, MD;  Location: [facility];  Service: Vascular;  Laterality: Left;  . EYE SURGERY Left    cataracts  . HEMORRHOID SURGERY    . NECK SURGERY    . TONSILLECTOMY      Medications  Current Outpatient Medications on File Prior to Visit  Medication Sig Dispense Refill  . aspirin (BAYER ASPIRIN) 81 MG tablet Take 1 tablet (81 mg total) by mouth daily.    Marland Kitchen atorvastatin (LIPITOR) 80 MG tablet TAKE 1 TABLET DAILY AT 6PM. 90 tablet 3  . metoprolol tartrate (LOPRESSOR) 50  MG tablet TAKE 1 TABLET TWICE DAILY 180 tablet 3  . omeprazole (PRILOSEC) 10 MG capsule Take 10 mg by mouth daily.     . traZODone (DESYREL) 50 MG tablet Take 1/2 to 1 tablet at bedtime as needed for sleep.  Needs ov before any more refills. 30 tablet 0   Allergies No Known Allergies  Family History Family History  Problem Relation Age of Onset  . Cancer Mother 76       Leukemia  . Coronary artery disease Father 74       MI    Review of Systems: Constitutional:  no fevers Eye:  no recent significant change in vision Ears:  No changes in hearing Nose/Mouth/Throat:  no complaints of nasal congestion, no sore throat Cardiovascular: no chest pain Respiratory:  No shortness of breath Gastrointestinal:  No change in bowel habits GU:  +nighttime frequency Integumentary:  no abnormal skin lesions reported Neurologic:  no headaches Endocrine:  denies unexplained weight changes  Exam BP 118/72 (BP Location: Left Arm, Patient Position: Sitting, Cuff Size: Normal)   Pulse 77   Temp (!) 95.7 F (35.4 C) (Temporal)   Ht 5\' 7"  (1.702 m)   Wt 209 lb 2 oz (94.9 kg)   SpO2 95%   BMI 32.75 kg/m  General:  well developed, well nourished, in no apparent distress Skin:  no significant moles, warts,  or growths Head:  no masses, lesions, or tenderness Eyes:  pupils equal and round, sclera anicteric without injection Ears:  canals without lesions, TMs shiny without retraction, no obvious effusion, no erythema Nose:  nares patent, septum midline, mucosa normal Throat/Pharynx:  lips and gingiva without lesion; tongue and uvula midline; non-inflamed pharynx; no exudates or postnasal drainage Lungs:  clear to auscultation, breath sounds equal bilaterally, no respiratory distress Cardio:  regular rate and rhythm, no LE edema or bruits Rectal: Deferred GI: BS+, S, NT, ND, no masses or organomegaly Musculoskeletal:  symmetrical muscle groups noted without atrophy or deformity Neuro:  gait normal;  deep tendon reflexes normal and symmetric Psych: well oriented with normal range of affect and appropriate judgment/insight  Assessment and Plan  Well adult exam - Plan: Comprehensive metabolic panel, Lipid panel  Encounter for hepatitis C screening test for low risk patient  Benign prostatic hyperplasia with nocturia - Plan: tadalafil (CIALIS) 5 MG tablet  Tobacco abuse  Erectile dysfunction, unspecified erectile dysfunction type - Plan: tadalafil (CIALIS) 5 MG tablet  Need for vaccination against Streptococcus pneumoniae - Plan: Pneumococcal polysaccharide vaccine 23-valent greater than or equal to 2yo subcutaneous/IM  Insomnia, unspecified type - Plan: traZODone (DESYREL) 100 MG tablet   Well 74 y.o. male. Counseled on diet and exercise. Other orders as above. Increase dose of trazodone to 100 mg qhs.  Start Cialis 5 mg/d for BPH and ED if covered.  Follow up in 6 weeks to reck.  The patient voiced understanding and agreement to the plan.  Jilda Roche [place], DO 12/11/19 2:36 PM

## 2019-12-11 NOTE — Telephone Encounter (Signed)
Will take care of

## 2019-12-11 NOTE — Patient Instructions (Addendum)
Give [place] 2-3 business days to get the results of your labs back.    Keep the diet clean and stay active.  The new Shingrix vaccine (for shingles) is a 2 shot series. It can make people feel low energy, achy and almost like they have the flu for 48 hours after injection. Please plan accordingly when deciding on when to get this shot. Call your pharmacy visit to get this. The second shot of the series is less severe regarding the side effects, but it still lasts 48 hours.   Call your pharmacy to get a tetanus booster as well.  No covid vaccine for the next 2 weeks since you received the pneumonia shot today.  Let us know if you need anything.

## 2019-12-11 NOTE — Telephone Encounter (Signed)
Please make sure all medications go to [facility].

## 2019-12-17 ENCOUNTER — Telehealth: Payer: Self-pay | Admitting: Family Medicine

## 2019-12-17 DIAGNOSIS — R351 Nocturia: Secondary | ICD-10-CM

## 2019-12-17 DIAGNOSIS — N529 Male erectile dysfunction, unspecified: Secondary | ICD-10-CM

## 2019-12-17 DIAGNOSIS — N401 Enlarged prostate with lower urinary tract symptoms: Secondary | ICD-10-CM

## 2019-12-17 MED ORDER — TADALAFIL 5 MG PO TABS: 5.0000 mg | ORAL_TABLET | Freq: Every day | ORAL | 2 refills | Status: DC

## 2019-12-17 NOTE — Telephone Encounter (Signed)
Medication: traZODone (DESYREL) 100 MG tablet tadalafil (CIALIS) 5 MG tablet [[id number]]     Has the patient contacted their pharmacy? Yes.   (If no, request that the patient contact the pharmacy for the refill.) (If yes, when and what did the pharmacy advise?)  Preferred Pharmacy (with phone number or street name):  [facility] - [place], [place] - [address] Phone:  [phone]  Fax:  [phone]       Agent: Please be advised that RX refills may take up to 3 business days. We ask that you follow-up with your pharmacy.

## 2019-12-17 NOTE — Telephone Encounter (Signed)
Last refill 12/11/19  #30 Last OV  12/11/19 Requesting a refill to mail order.

## 2020-01-16 ENCOUNTER — Other Ambulatory Visit: Payer: Self-pay | Admitting: Cardiology

## 2020-01-22 ENCOUNTER — Encounter: Payer: Self-pay | Admitting: Family Medicine

## 2020-01-22 ENCOUNTER — Other Ambulatory Visit: Payer: Self-pay

## 2020-01-22 ENCOUNTER — Ambulatory Visit (INDEPENDENT_AMBULATORY_CARE_PROVIDER_SITE_OTHER): Payer: Medicare HMO | Admitting: Family Medicine

## 2020-01-22 VITALS — BP 122/72 | HR 76 | Temp 95.7°F | Ht 66.0 in | Wt 207.4 lb

## 2020-01-22 DIAGNOSIS — N529 Male erectile dysfunction, unspecified: Secondary | ICD-10-CM

## 2020-01-22 DIAGNOSIS — G4700 Insomnia, unspecified: Secondary | ICD-10-CM | POA: Diagnosis not present

## 2020-01-22 NOTE — Progress Notes (Signed)
Chief Complaint  Patient presents with  . Follow-up    Trazodone and Cialis not working    Subjective: Patient is a 74 y.o. male here for f/u.  Patient was prescribed trazodone for insomnia.  He reports it made him high.  He stopped taking it.  He says he sleeps but it is during the daytime.  He tries not to nap but it is normally inevitable for him.  He does have racing thoughts and thinks of things he has to do when he does wake up.  He does not wish to go on any other medicine.  He is not following with a counselor or psychologist.  The patient has a history of erectile dysfunction.  He was prescribed Cialis 5 mg.  He took 1 tablet and nearly passed out the next day.  He does not wish to take this medication any further.  He will think about a pump and let us know if you want to see the urology team.  Past Medical History:  Diagnosis Date  . Acute myocardial infarction, unspecified site, episode of care unspecified 1985  . CAD (coronary artery disease)   . Cerebrovascular disease, unspecified   . Coronary atherosclerosis of unspecified type of vessel, native or graft   . GERD (gastroesophageal reflux disease)   . History of heart bypass surgery   . MI (myocardial infarction) ([facility])   . Other and unspecified hyperlipidemia   . Renal hematoma 2005    Objective: BP 122/72 (BP Location: Left Arm, Patient Position: Sitting, Cuff Size: Normal)   Pulse 76   Temp (!) 95.7 F (35.4 C) (Temporal)   Ht 5\' 6"  (1.676 m)   Wt 207 lb 6 oz (94.1 kg)   SpO2 96%   BMI 33.47 kg/m  General: Awake, appears stated age HEENT: MMM, EOMi Heart: RRR, no LE edema Lungs: CTAB, no rales, wheezes or rhonchi. No accessory muscle use Psych: Age appropriate judgment and insight, normal affect and mood  Assessment and Plan: Insomnia, unspecified type  Erectile dysfunction, unspecified erectile dysfunction type  1- Stop med. CBT info provided. Sleep hygiene discussed and written down.  2- Stop med.  Offered urology referral, he will let know if he decides to go down this route. F/u for CPE next March.  The patient voiced understanding and agreement to the plan.  April Harwood, DO 01/22/20  2:20 PM

## 2020-01-22 NOTE — Patient Instructions (Signed)
We will give you a standing offer to see a urologist.  Sleep Hygiene Tips:  Do not watch TV or look at screens within 1 hour of going to bed. If you do, make sure there is a blue light filter (nighttime mode) involved.  Try to go to bed around the same time every night. Wake up at the same time within 1 hour of regular time. Ex: If you wake up at 7 AM for work, do not sleep past 8 AM on days that you don't work.  Do not drink alcohol before bedtime.  Do not consume caffeine-containing beverages after noon or within 9 hours of intended bedtime.  Get regular exercise/physical activity in your life, but not within 2 hours of planned bedtime.  Do not take naps.   Do not eat within 2 hours of planned bedtime.  Melatonin, 3-5 mg 30-60 minutes before planned bedtime may be helpful.   The bed should be for sleep or sex only. If after 20-30 minutes you are unable to fall asleep, get up and do something relaxing. Do this until you feel ready to go to sleep again.   Sleep is important to [place] all. Getting good sleep is imperative to adequate functioning during the day. Work with our counselors who are trained to help people obtain quality sleep. Call [phone] to schedule an appointment or if you are curious about insurance coverage/cost.  Let us know if you need anything.

## 2020-10-29 ENCOUNTER — Other Ambulatory Visit: Payer: Self-pay | Admitting: Cardiology

## 2020-11-27 ENCOUNTER — Other Ambulatory Visit: Payer: Self-pay | Admitting: Cardiology

## 2020-12-08 ENCOUNTER — Ambulatory Visit: Payer: Medicare HMO | Admitting: *Deleted

## 2020-12-10 NOTE — Progress Notes (Signed)
HPI: FU coronary disease. The patient underwent coronary artery bypass and graft in [place] in February of 2010 (LIMA to the LAD, saphenous vein graft to the circumflex, and free RIMA to the ramus). Note an echocardiogram in February of 2010 showed an ejection fraction of 50-55%.Abdominal ultrasound August 2018 showed no aneurysm. 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 Nuclear study August 2018 showed ejection fraction 50%. There was prior infarct but no ischemia. Had left carotid endarterectomy in January 2020.    Carotid Dopplers December 2020 showed 1 to 39% right and 40 to 59% left stenosis.  Since he was last seen, he denies dyspnea, chest pain, palpitations or syncope.  Current Outpatient Medications  Medication Sig Dispense Refill  . aspirin 81 MG tablet Take 1 tablet (81 mg total) by mouth daily.    Marland Kitchen atorvastatin (LIPITOR) 80 MG tablet TAKE 1 TABLET DAILY AT 6PM. 30 tablet 0  . metoprolol tartrate (LOPRESSOR) 50 MG tablet TAKE 1 TABLET TWICE DAILY 60 tablet 0  . omeprazole (PRILOSEC) 10 MG capsule Take 10 mg by mouth daily.      No current facility-administered medications for this visit.     Past Medical History:  Diagnosis Date  . Acute myocardial infarction, unspecified site, episode of care unspecified 1985  . CAD (coronary artery disease)   . Cerebrovascular disease, unspecified   . Coronary atherosclerosis of unspecified type of vessel, native or graft   . GERD (gastroesophageal reflux disease)   . History of heart bypass surgery   . MI (myocardial infarction) (HCC)   . Other and unspecified hyperlipidemia   . Renal hematoma 2005    Past Surgical History:  Procedure Laterality Date  . COLONOSCOPY WITH ESOPHAGOGASTRODUODENOSCOPY (EGD)    . CORONARY ARTERY BYPASS GRAFT     S/P in February of 2010. Procedure performed in [place]  . ENDARTERECTOMY Left 10/12/2018   Procedure: ENDARTERECTOMY CAROTID LEFT;  Surgeon: Larina Earthly, MD;  Location:  [facility] OR;  Service: Vascular;  Laterality: Left;  . EYE SURGERY Left    cataracts  . HEMORRHOID SURGERY    . NECK SURGERY    . TONSILLECTOMY      Social History   Socioeconomic History  . Marital status: Married    Spouse name: Not on file  . Number of children: Not on file  . Years of education: Not on file  . Highest education level: Not on file  Occupational History  . Occupation: Full Time  Tobacco Use  . Smoking status: Current Every Day Smoker    Packs/day: 2.00    Years: 50.00    Pack years: 100.00    Types: Cigars    Last attempt to quit: 10/21/2008    Years since quitting: 12.1  . Smokeless tobacco: Former Neurosurgeon    Quit date: 10/03/1983  . Tobacco comment: Now smoking cigars  Vaping Use  . Vaping Use: Never used  Substance and Sexual Activity  . Alcohol use: Yes    Comment: occasionally  . Drug use: No  . Sexual activity: Not on file  Other Topics Concern  . Not on file  Social History Narrative   No Regular Exercise   Social Determinants of Health   Financial Resource Strain: Not on file  Food Insecurity: Not on file  Transportation Needs: Not on file  Physical Activity: Not on file  Stress: Not on file  Social Connections: Not on file  Intimate Partner Violence: Not on file  Family History  Problem Relation Age of Onset  . Cancer Mother 31       Leukemia  . Coronary artery disease Father 75       MI    ROS: no fevers or chills, productive cough, hemoptysis, dysphasia, odynophagia, melena, hematochezia, dysuria, hematuria, rash, seizure activity, orthopnea, PND, pedal edema, claudication. Remaining systems are negative.  Physical Exam: Well-developed well-nourished in no acute distress.  Skin is warm and dry.  HEENT is normal.  Neck is supple.  Chest is clear to auscultation with normal expansion.  Cardiovascular exam is regular rate and rhythm.  Abdominal exam nontender or distended. No masses palpated. Extremities show no edema. neuro  grossly intact  ECG-sinus rhythm with first-degree AV block, no ST changes.  Personally reviewed  A/P  1 coronary artery disease-patient denies chest pain.  Continue aspirin and statin.  2 carotid artery disease-arrange follow-up carotid Dopplers.  3 hyperlipidemia-continue statin.  Continue medical therapy including aspirin and statin.  4 tobacco abuse-patient again counseled on discontinuing.  Olga Millers, MD

## 2020-12-12 ENCOUNTER — Encounter: Payer: Self-pay | Admitting: Family Medicine

## 2020-12-12 ENCOUNTER — Ambulatory Visit (INDEPENDENT_AMBULATORY_CARE_PROVIDER_SITE_OTHER): Payer: Medicare HMO | Admitting: Family Medicine

## 2020-12-12 ENCOUNTER — Other Ambulatory Visit: Payer: Self-pay

## 2020-12-12 VITALS — BP 128/78 | HR 83 | Temp 99.3°F | Ht 66.0 in | Wt 202.1 lb

## 2020-12-12 DIAGNOSIS — Z122 Encounter for screening for malignant neoplasm of respiratory organs: Secondary | ICD-10-CM | POA: Diagnosis not present

## 2020-12-12 DIAGNOSIS — Z0000 Encounter for general adult medical examination without abnormal findings: Secondary | ICD-10-CM | POA: Diagnosis not present

## 2020-12-12 DIAGNOSIS — Z1159 Encounter for screening for other viral diseases: Secondary | ICD-10-CM | POA: Diagnosis not present

## 2020-12-12 DIAGNOSIS — I2510 Atherosclerotic heart disease of native coronary artery without angina pectoris: Secondary | ICD-10-CM | POA: Diagnosis not present

## 2020-12-12 DIAGNOSIS — K644 Residual hemorrhoidal skin tags: Secondary | ICD-10-CM | POA: Diagnosis not present

## 2020-12-12 LAB — COMPREHENSIVE METABOLIC PANEL
ALT: 12 U/L (ref 0–53)
AST: 16 U/L (ref 0–37)
Albumin: 4.1 g/dL (ref 3.5–5.2)
Alkaline Phosphatase: 91 U/L (ref 39–117)
BUN: 9 mg/dL (ref 6–23)
CO2: 28 mEq/L (ref 19–32)
Calcium: 10.2 mg/dL (ref 8.4–10.5)
Chloride: 105 mEq/L (ref 96–112)
Creatinine, Ser: 1.44 mg/dL (ref 0.40–1.50)
GFR: 47.05 mL/min — ABNORMAL LOW (ref >60.00)
Glucose, Bld: 114 mg/dL — ABNORMAL HIGH (ref 70–99)
Potassium: 4.6 mEq/L (ref 3.5–5.1)
Sodium: 140 mEq/L (ref 135–145)
Total Bilirubin: 0.9 mg/dL (ref 0.2–1.2)
Total Protein: 6.6 g/dL (ref 6.0–8.3)

## 2020-12-12 LAB — LIPID PANEL
Cholesterol: 120 mg/dL (ref 0–200)
HDL: 31.9 mg/dL — ABNORMAL LOW (ref >39.00)
LDL Cholesterol: 69 mg/dL (ref 0–99)
NonHDL: 87.83
Total CHOL/HDL Ratio: 4
Triglycerides: 96 mg/dL (ref 0.0–149.0)
VLDL: 19.2 mg/dL (ref 0.0–40.0)

## 2020-12-12 NOTE — Patient Instructions (Addendum)
Give [place] 2-3 business days to get the results of your labs back.   Keep the diet clean and stay active.  The new Shingrix vaccine (for shingles) is a 2 shot series. It can make people feel low energy, achy and almost like they have the flu for 48 hours after injection. Please plan accordingly when deciding on when to get this shot. Call the pharmacy to get this.  The second shot of the series is less severe regarding the side effects, but it still lasts 48 hours.   Call the pharmacy to get a tetanus shot.   Let us know if you need anything.

## 2020-12-12 NOTE — Progress Notes (Signed)
Chief Complaint  Patient presents with  . Annual Exam    Well Male Ethan Estrada is here for a complete physical.   His last physical was >1 year ago.  Current diet: in general, diet is fair.   Current exercise: active in yard Weight trend: stable Fatigue out of ordinary? No. Seat belt? Yes.   Loss of interested in doing things or depression in past 2 weeks? No  Health maintenance Shingrix- No Tetanus- No Hep C- No Lung cancer screening- No Pneumonia vaccine- Yes  Past Medical History:  Diagnosis Date  . Acute myocardial infarction, unspecified site, episode of care unspecified 1985  . CAD (coronary artery disease)   . Cerebrovascular disease, unspecified   . Coronary atherosclerosis of unspecified type of vessel, native or graft   . GERD (gastroesophageal reflux disease)   . History of heart bypass surgery   . MI (myocardial infarction) (HCC)   . Other and unspecified hyperlipidemia   . Renal hematoma 2005     Past Surgical History:  Procedure Laterality Date  . COLONOSCOPY WITH ESOPHAGOGASTRODUODENOSCOPY (EGD)    . CORONARY ARTERY BYPASS GRAFT     S/P in February of 2010. Procedure performed in [place]  . ENDARTERECTOMY Left 10/12/2018   Procedure: ENDARTERECTOMY CAROTID LEFT;  Surgeon: Larina Earthly, MD;  Location: [facility] OR;  Service: Vascular;  Laterality: Left;  . EYE SURGERY Left    cataracts  . HEMORRHOID SURGERY    . NECK SURGERY    . TONSILLECTOMY      Medications  Current Outpatient Medications on File Prior to Visit  Medication Sig Dispense Refill  . aspirin 81 MG tablet Take 1 tablet (81 mg total) by mouth daily.    Marland Kitchen atorvastatin (LIPITOR) 80 MG tablet TAKE 1 TABLET DAILY AT 6PM. 30 tablet 0  . metoprolol tartrate (LOPRESSOR) 50 MG tablet TAKE 1 TABLET TWICE DAILY 60 tablet 0  . omeprazole (PRILOSEC) 10 MG capsule Take 10 mg by mouth daily.       Allergies No Known Allergies  Family History Family History  Problem Relation Age of Onset  .  Cancer Mother 11       Leukemia  . Coronary artery disease Father 62       MI    Review of Systems: Constitutional:  no fevers Eye:  no recent significant change in vision Ears:  No changes in hearing Nose/Mouth/Throat:  no complaints of nasal congestion, no sore throat Cardiovascular: no chest pain Respiratory:  No shortness of breath Gastrointestinal:  No change in bowel habits; +ext hemorrhoids GU:  No frequency Integumentary:  no abnormal skin lesions reported Neurologic:  no headaches Endocrine:  denies unexplained weight changes  Exam BP 128/78 (BP Location: Left Arm, Patient Position: Sitting, Cuff Size: Normal)   Pulse 83   Temp 99.3 F (37.4 C) (Oral)   Ht 5\' 6"  (1.676 m)   Wt 202 lb 2 oz (91.7 kg)   SpO2 97%   BMI 32.62 kg/m  General:  well developed, well nourished, in no apparent distress Skin:  no significant moles, warts, or growths Head:  no masses, lesions, or tenderness Eyes:  pupils equal and round, sclera anicteric without injection Ears:  canals without lesions, TMs shiny without retraction, no obvious effusion, no erythema Nose:  nares patent, septum midline, mucosa normal Throat/Pharynx:  lips and gingiva without lesion; tongue and uvula midline; non-inflamed pharynx; no exudates or postnasal drainage Lungs:  clear to auscultation, breath sounds equal bilaterally, no  respiratory distress Cardio:  regular rate and rhythm, no LE edema or bruits Rectal: Deferred GI: BS+, S, NT, ND, no masses or organomegaly Musculoskeletal:  symmetrical muscle groups noted without atrophy or deformity Neuro:  gait normal; deep tendon reflexes normal and symmetric Psych: well oriented with normal range of affect and appropriate judgment/insight  Assessment and Plan  Well adult exam  Atherosclerosis of native coronary artery of native heart without angina pectoris - Plan: Comprehensive metabolic panel, Lipid panel  Encounter for hepatitis C screening test for low  risk patient - Plan: Hepatitis C antibody  External hemorrhoids - Plan: Ambulatory referral to General Surgery  Screening for lung cancer - Plan: CT CHEST LUNG CANCER SCREENING LOW DOSE WO CONTRAST   Well 75 y.o. male. Counseled on diet and exercise. Other orders as above. Shingrix and Tdap at pharmacy rec'd.  Booster also rec'd for covid.  Follow up in 1 yr or prn.  The patient voiced understanding and agreement to the plan.  Jilda Roche [place], DO 12/12/20 1:12 PM

## 2020-12-15 LAB — HEPATITIS C ANTIBODY
Hepatitis C Ab: NONREACTIVE
SIGNAL TO CUT-OFF: 0.01 (ref <1.00)

## 2020-12-17 ENCOUNTER — Encounter: Payer: Self-pay | Admitting: Cardiology

## 2020-12-17 ENCOUNTER — Ambulatory Visit: Payer: Medicare HMO | Admitting: Cardiology

## 2020-12-17 ENCOUNTER — Other Ambulatory Visit: Payer: Self-pay

## 2020-12-17 VITALS — BP 140/82 | HR 69 | Ht 66.0 in | Wt 205.0 lb

## 2020-12-17 DIAGNOSIS — I2510 Atherosclerotic heart disease of native coronary artery without angina pectoris: Secondary | ICD-10-CM | POA: Diagnosis not present

## 2020-12-17 DIAGNOSIS — F17200 Nicotine dependence, unspecified, uncomplicated: Secondary | ICD-10-CM

## 2020-12-17 DIAGNOSIS — I6523 Occlusion and stenosis of bilateral carotid arteries: Secondary | ICD-10-CM

## 2020-12-17 DIAGNOSIS — E7800 Pure hypercholesterolemia, unspecified: Secondary | ICD-10-CM | POA: Diagnosis not present

## 2020-12-17 NOTE — Patient Instructions (Signed)
  Testing/Procedures:  Your physician has requested that you have a carotid duplex. This test is an ultrasound of the carotid arteries in your neck. It looks at blood flow through these arteries that supply the brain with blood. Allow one hour for this exam. There are no restrictions or special instructions.[facility] [facility]     Follow-Up: At [facility], you and your health needs are our priority.  As part of our continuing mission to provide you with exceptional heart care, we have created designated Provider Care Teams.  These Care Teams include your primary Cardiologist (physician) and Advanced Practice Providers (APPs -  Physician Assistants and Nurse Practitioners) who all work together to provide you with the care you need, when you need it.  We recommend signing up for the patient portal called "MyChart".  Sign up information is provided on this After Visit Summary.  MyChart is used to connect with patients for Virtual Visits (Telemedicine).  Patients are able to view lab/test results, encounter notes, upcoming appointments, etc.  Non-urgent messages can be sent to your provider as well.   To learn more about what you can do with MyChart, go to [email or web address].    Your next appointment:   12 month(s)  The format for your next appointment:   In Person  Provider:   Olga Millers, MD

## 2020-12-18 ENCOUNTER — Ambulatory Visit (INDEPENDENT_AMBULATORY_CARE_PROVIDER_SITE_OTHER): Payer: Medicare HMO

## 2020-12-18 VITALS — BP 158/84 | HR 73 | Temp 98.2°F | Resp 16 | Ht 66.0 in | Wt 204.8 lb

## 2020-12-18 DIAGNOSIS — Z0000 Encounter for general adult medical examination without abnormal findings: Secondary | ICD-10-CM

## 2020-12-18 NOTE — Patient Instructions (Signed)
Ethan Estrada , Thank you for taking time to come for your Medicare Wellness Visit. I appreciate your ongoing commitment to your health goals. Please review the following plan we discussed and let me know if I can assist you in the future.   Screening recommendations/referrals: Colonoscopy: No longer required Recommended yearly ophthalmology/optometry visit for glaucoma screening and checkup Recommended yearly dental visit for hygiene and checkup  Vaccinations: Influenza vaccine: Due 05/2021 Pneumococcal vaccine: Completed Pneumovax 23-11/2019 Tdap vaccine: Discuss with pharmacy Shingles vaccine: Discuss with pharmacy   Covid-19: Booster due-Discuss with pharmacy  Advanced directives: Please bring a copy for your chart  Conditions/risks identified: See problem list  Next appointment: Follow up in one year for your annual wellness visit.   Preventive Care 31 Years and Older, Male Preventive care refers to lifestyle choices and visits with your health care provider that can promote health and wellness. What does preventive care include?  A yearly physical exam. This is also called an annual well check.  Dental exams once or twice a year.  Routine eye exams. Ask your health care provider how often you should have your eyes checked.  Personal lifestyle choices, including:  Daily care of your teeth and gums.  Regular physical activity.  Eating a healthy diet.  Avoiding tobacco and drug use.  Limiting alcohol use.  Practicing safe sex.  Taking low doses of aspirin every day.  Taking vitamin and mineral supplements as recommended by your health care provider. What happens during an annual well check? The services and screenings done by your health care provider during your annual well check will depend on your age, overall health, lifestyle risk factors, and family history of disease. Counseling  Your health care provider may ask you questions about your:  Alcohol  use.  Tobacco use.  Drug use.  Emotional well-being.  Home and relationship well-being.  Sexual activity.  Eating habits.  History of falls.  Memory and ability to understand (cognition).  Work and work Astronomer. Screening  You may have the following tests or measurements:  Height, weight, and BMI.  Blood pressure.  Lipid and cholesterol levels. These may be checked every 5 years, or more frequently if you are over 69 years old.  Skin check.  Lung cancer screening. You may have this screening every year starting at age 50 if you have a 30-pack-year history of smoking and currently smoke or have quit within the past 15 years.  Fecal occult blood test (FOBT) of the stool. You may have this test every year starting at age 1.  Flexible sigmoidoscopy or colonoscopy. You may have a sigmoidoscopy every 5 years or a colonoscopy every 10 years starting at age 48.  Prostate cancer screening. Recommendations will vary depending on your family history and other risks.  Hepatitis C blood test.  Hepatitis B blood test.  Sexually transmitted disease (STD) testing.  Diabetes screening. This is done by checking your blood sugar (glucose) after you have not eaten for a while (fasting). You may have this done every 1-3 years.  Abdominal aortic aneurysm (AAA) screening. You may need this if you are a current or former smoker.  Osteoporosis. You may be screened starting at age 59 if you are at high risk. Talk with your health care provider about your test results, treatment options, and if necessary, the need for more tests. Vaccines  Your health care provider may recommend certain vaccines, such as:  Influenza vaccine. This is recommended every year.  Tetanus, diphtheria, and  acellular pertussis (Tdap, Td) vaccine. You may need a Td booster every 10 years.  Zoster vaccine. You may need this after age 72.  Pneumococcal 13-valent conjugate (PCV13) vaccine. One dose is  recommended after age 48.  Pneumococcal polysaccharide (PPSV23) vaccine. One dose is recommended after age 17. Talk to your health care provider about which screenings and vaccines you need and how often you need them. This information is not intended to replace advice given to you by your health care provider. Make sure you discuss any questions you have with your health care provider. Document Released: 10/03/2015 Document Revised: 05/26/2016 Document Reviewed: 07/08/2015 Elsevier Interactive Patient Education  2017 [place] Prevention in the Home Falls can cause injuries. They can happen to people of all ages. There are many things you can do to make your home safe and to help prevent falls. What can I do on the outside of my home?  Regularly fix the edges of walkways and driveways and fix any cracks.  Remove anything that might make you trip as you walk through a door, such as a raised step or threshold.  Trim any bushes or trees on the path to your home.  Use bright outdoor lighting.  Clear any walking paths of anything that might make someone trip, such as rocks or tools.  Regularly check to see if handrails are loose or broken. Make sure that both sides of any steps have handrails.  Any raised decks and porches should have guardrails on the edges.  Have any leaves, snow, or ice cleared regularly.  Use sand or salt on walking paths during winter.  Clean up any spills in your garage right away. This includes oil or grease spills. What can I do in the bathroom?  Use night lights.  Install grab bars by the toilet and in the tub and shower. Do not use towel bars as grab bars.  Use non-skid mats or decals in the tub or shower.  If you need to sit down in the shower, use a plastic, non-slip stool.  Keep the floor dry. Clean up any water that spills on the floor as soon as it happens.  Remove soap buildup in the tub or shower regularly.  Attach bath mats  securely with double-sided non-slip rug tape.  Do not have throw rugs and other things on the floor that can make you trip. What can I do in the bedroom?  Use night lights.  Make sure that you have a light by your bed that is easy to reach.  Do not use any sheets or blankets that are too big for your bed. They should not hang down onto the floor.  Have a firm chair that has side arms. You can use this for support while you get dressed.  Do not have throw rugs and other things on the floor that can make you trip. What can I do in the kitchen?  Clean up any spills right away.  Avoid walking on wet floors.  Keep items that you use a lot in easy-to-reach places.  If you need to reach something above you, use a strong step stool that has a grab bar.  Keep electrical cords out of the way.  Do not use floor polish or wax that makes floors slippery. If you must use wax, use non-skid floor wax.  Do not have throw rugs and other things on the floor that can make you trip. What can I do with my stairs?  Do not leave any items on the stairs.  Make sure that there are handrails on both sides of the stairs and use them. Fix handrails that are broken or loose. Make sure that handrails are as long as the stairways.  Check any carpeting to make sure that it is firmly attached to the stairs. Fix any carpet that is loose or worn.  Avoid having throw rugs at the top or bottom of the stairs. If you do have throw rugs, attach them to the floor with carpet tape.  Make sure that you have a light switch at the top of the stairs and the bottom of the stairs. If you do not have them, ask someone to add them for you. What else can I do to help prevent falls?  Wear shoes that:  Do not have high heels.  Have rubber bottoms.  Are comfortable and fit you well.  Are closed at the toe. Do not wear sandals.  If you use a stepladder:  Make sure that it is fully opened. Do not climb a closed  stepladder.  Make sure that both sides of the stepladder are locked into place.  Ask someone to hold it for you, if possible.  Clearly mark and make sure that you can see:  Any grab bars or handrails.  First and last steps.  Where the edge of each step is.  Use tools that help you move around (mobility aids) if they are needed. These include:  Canes.  Walkers.  Scooters.  Crutches.  Turn on the lights when you go into a dark area. Replace any light bulbs as soon as they burn out.  Set up your furniture so you have a clear path. Avoid moving your furniture around.  If any of your floors are uneven, fix them.  If there are any pets around you, be aware of where they are.  Review your medicines with your doctor. Some medicines can make you feel dizzy. This can increase your chance of falling. Ask your doctor what other things that you can do to help prevent falls. This information is not intended to replace advice given to you by your health care provider. Make sure you discuss any questions you have with your health care provider. Document Released: 07/03/2009 Document Revised: 02/12/2016 Document Reviewed: 10/11/2014 Elsevier Interactive Patient Education  2017 [employer].

## 2020-12-18 NOTE — Progress Notes (Signed)
Subjective:   Yosgart Pavey is a 75 y.o. male who presents for Medicare Annual/Subsequent preventive examination.  Review of Systems     Cardiac Risk Factors include: advanced age (>20men, >109 women);male gender;dyslipidemia;obesity (BMI >30kg/m2)     Objective:    Today's Vitals   12/18/20 1443  BP: (!) 158/84  Pulse: 73  Resp: 16  Temp: 98.2 F (36.8 C)  TempSrc: Temporal  SpO2: 97%  Weight: 204 lb 12.8 oz (92.9 kg)  Height: 5\' 6"  (1.676 m)   Body mass index is 33.06 kg/m.  Advanced Directives 12/18/2020 12/07/2019 08/28/2019 10/13/2018 10/12/2018 10/02/2018  Does Patient Have a Medical Advance Directive? Yes Yes Yes Yes Yes Yes  Type of 10/04/2018 of [place];Living will Healthcare Power of [place];Living will Healthcare Power of [place];Living will Healthcare Power of [place];Living will Healthcare Power of [place];Living will Healthcare Power of [place];Living will  Does patient want to make changes to medical advance directive? - No - Patient declined No - Patient declined No - Patient declined - -  Copy of Healthcare Power of Attorney in Chart? No - copy requested No - copy requested - - No - copy requested No - copy requested    Current Medications (verified) Outpatient Encounter Medications as of 12/18/2020  Medication Sig  . aspirin 81 MG tablet Take 1 tablet (81 mg total) by mouth daily.  12/20/2020 atorvastatin (LIPITOR) 80 MG tablet TAKE 1 TABLET DAILY AT 6PM.  . metoprolol tartrate (LOPRESSOR) 50 MG tablet TAKE 1 TABLET TWICE DAILY  . omeprazole (PRILOSEC) 10 MG capsule Take 10 mg by mouth daily.    No facility-administered encounter medications on file as of 12/18/2020.    Allergies (verified) Patient has no known allergies.   History: Past Medical History:  Diagnosis Date  . Acute myocardial infarction, unspecified site, episode of care unspecified 1985  . CAD (coronary artery disease)   . Cerebrovascular disease, unspecified   .  Coronary atherosclerosis of unspecified type of vessel, native or graft   . GERD (gastroesophageal reflux disease)   . History of heart bypass surgery   . MI (myocardial infarction) (HCC)   . Other and unspecified hyperlipidemia   . Renal hematoma 2005   Past Surgical History:  Procedure Laterality Date  . COLONOSCOPY WITH ESOPHAGOGASTRODUODENOSCOPY (EGD)    . CORONARY ARTERY BYPASS GRAFT     S/P in February of 2010. Procedure performed in [place]  . ENDARTERECTOMY Left 10/12/2018   Procedure: ENDARTERECTOMY CAROTID LEFT;  Surgeon: 10/14/2018, MD;  Location: [facility] OR;  Service: Vascular;  Laterality: Left;  . EYE SURGERY Left    cataracts  . HEMORRHOID SURGERY    . NECK SURGERY    . TONSILLECTOMY     Family History  Problem Relation Age of Onset  . Cancer Mother 75       Leukemia  . Coronary artery disease Father 74       MI   Social History   Socioeconomic History  . Marital status: Married    Spouse name: Not on file  . Number of children: Not on file  . Years of education: Not on file  . Highest education level: Not on file  Occupational History  . Occupation: Full Time  Tobacco Use  . Smoking status: Current Every Day Smoker    Packs/day: 2.00    Years: 50.00    Pack years: 100.00    Types: Cigars    Last attempt to quit: 10/21/2008  Years since quitting: 12.1  . Smokeless tobacco: Former Neurosurgeon    Quit date: 10/03/1983  . Tobacco comment: Now smoking cigars  Vaping Use  . Vaping Use: Never used  Substance and Sexual Activity  . Alcohol use: Yes    Comment: occasionally  . Drug use: No  . Sexual activity: Not on file  Other Topics Concern  . Not on file  Social History Narrative   No Regular Exercise   Social Determinants of Health   Financial Resource Strain: Low Risk   . Difficulty of Paying Living Expenses: Not hard at all  Food Insecurity: No Food Insecurity  . Worried About Programme researcher, broadcasting/film/video in the Last Year: Never true  . Ran Out of Food  in the Last Year: Never true  Transportation Needs: No Transportation Needs  . Lack of Transportation (Medical): No  . Lack of Transportation (Non-Medical): No  Physical Activity: Inactive  . Days of Exercise per Week: 0 days  . Minutes of Exercise per Session: 0 min  Stress: No Stress Concern Present  . Feeling of Stress : Not at all  Social Connections: Moderately Isolated  . Frequency of Communication with Friends and Family: More than three times a week  . Frequency of Social Gatherings with Friends and Family: More than three times a week  . Attends Religious Services: Never  . Active Member of Clubs or Organizations: No  . Attends Banker Meetings: Never  . Marital Status: Married    Tobacco Counseling Ready to quit: Not Answered Counseling given: Not Answered Comment: Now smoking cigars   Clinical Intake:  Pre-visit preparation completed: Yes  Pain : No/denies pain     Nutritional Status: BMI > 30  Obese Nutritional Risks: None Diabetes: No  How often do you need to have someone help you when you read instructions, pamphlets, or other written materials from your doctor or pharmacy?: 1 - Never  Diabetic?No  Interpreter Needed?: No  Information entered by :: Thomasenia Sales LPN   Activities of Daily Living In your present state of health, do you have any difficulty performing the following activities: 12/18/2020  Hearing? N  Vision? N  Difficulty concentrating or making decisions? N  Walking or climbing stairs? N  Dressing or bathing? N  Doing errands, shopping? N  Preparing Food and eating ? N  Using the Toilet? N  In the past six months, have you accidently leaked urine? N  Do you have problems with loss of bowel control? N  Managing your Medications? N  Managing your Finances? N  Housekeeping or managing your Housekeeping? N  Some recent data might be hidden    Patient Care Team: Sharlene Dory, DO as PCP - General (Family  Medicine) Jens Som Madolyn Frieze, MD as PCP - Cardiology (Cardiology)  Indicate any recent Medical Services you may have received from other than [facility] providers in the past year (date may be approximate).     Assessment:   This is a routine wellness examination for Jobos.  Hearing/Vision screen  Hearing Screening   125Hz  250Hz  500Hz  1000Hz  2000Hz  3000Hz  4000Hz  6000Hz  8000Hz   Right ear:           Left ear:           Comments: No issues  Vision Screening Comments: Wears glasses Last eye exam-2 years  Dietary issues and exercise activities discussed: Current Exercise Habits: The patient does not participate in regular exercise at present, Exercise limited by: None identified  Goals    . DIET - INCREASE WATER INTAKE    . Increase physical activity     5 min rule       Depression Screen PHQ 2/9 Scores 12/18/2020 12/12/2020 12/07/2019  PHQ - 2 Score 0 0 0    Fall Risk Fall Risk  12/18/2020 12/07/2019 12/07/2019  Falls in the past year? 0 0 0  Number falls in past yr: 0 0 0  Injury with Fall? 0 0 0  Follow up Falls prevention discussed - Education provided;Falls prevention discussed    FALL RISK PREVENTION PERTAINING TO THE HOME:  Any stairs in or around the home? Yes  If so, are there any without handrails? No  Home free of loose throw rugs in walkways, pet beds, electrical cords, etc? Yes  Adequate lighting in your home to reduce risk of falls? Yes   ASSISTIVE DEVICES UTILIZED TO PREVENT FALLS:  Life alert? No  Use of a cane, walker or w/c? No  Grab bars in the bathroom? Yes  Shower chair or bench in shower? No  Elevated toilet seat or a handicapped toilet? No   TIMED UP AND GO:  Was the test performed? Yes .  Length of time to ambulate 10 feet: 10 sec.   Gait steady and fast without use of assistive device  Cognitive Function:Normal cognitive status assessed by direct observation by this Nurse Health Advisor. No abnormalities found.           Immunizations Immunization History  Administered Date(s) Administered  . Influenza, High Dose Seasonal PF 06/29/2018, 07/04/2019  . PFIZER Comirnaty(Gray Top)Covid-19 Tri-Sucrose Vaccine 01/29/2020, 02/26/2020  . Pneumococcal Polysaccharide-23 12/11/2019    TDAP status: Due, Education has been provided regarding the importance of this vaccine. Advised may receive this vaccine at local pharmacy or Health Dept. Aware to provide a copy of the vaccination record if obtained from local pharmacy or Health Dept. Verbalized acceptance and understanding.  Flu Vaccine status: Declined, Education has been provided regarding the importance of this vaccine but patient still declined. Advised may receive this vaccine at local pharmacy or Health Dept. Aware to provide a copy of the vaccination record if obtained from local pharmacy or Health Dept. Verbalized acceptance and understanding.  Pneumococcal vaccine status: Due, Education has been provided regarding the importance of this vaccine. Advised may receive this vaccine at local pharmacy or Health Dept. Aware to provide a copy of the vaccination record if obtained from local pharmacy or Health Dept. Verbalized acceptance and understanding.  Covid-19 vaccine status: Information provided on how to obtain vaccines. Booster due  Qualifies for Shingles Vaccine? Yes   Zostavax completed No   Shingrix Completed?: No.    Education has been provided regarding the importance of this vaccine. Patient has been advised to call insurance company to determine out of pocket expense if they have not yet received this vaccine. Advised may also receive vaccine at local pharmacy or Health Dept. Verbalized acceptance and understanding.  Screening Tests Health Maintenance  Topic Date Due  . TETANUS/TDAP  Never done  . INFLUENZA VACCINE  01/28/2021 (Originally 04/20/2020)  . COVID-19 Vaccine (3 - Pfizer risk 4-dose series) 12/12/2021 (Originally 03/25/2020)  . Hepatitis C  Screening  Completed  . HPV VACCINES  Aged Out  . PNA vac Low Risk Adult  Discontinued    Health Maintenance  Health Maintenance Due  Topic Date Due  . TETANUS/TDAP  Never done    Colorectal cancer screening: No longer required.   Lung  Cancer Screening: (Low Dose CT Chest recommended if Age 7-80 years, 30 pack-year currently smoking OR have quit w/in 15years.) does qualify.   Lung Cancer Screening Referral: Ordered 12/10/2020 by Dr. Carmelia RollerWendling.  Additional Screening:  Hepatitis C Screening:Completed 12/12/2020  Vision Screening: Recommended annual ophthalmology exams for early detection of glaucoma and other disorders of the eye. Is the patient up to date with their annual eye exam?  No  Who is the provider or what is the name of the office in which the patient attends annual eye exams? Unsure of name Patient to make an appt   Dental Screening: Recommended annual dental exams for proper oral hygiene  Community Resource Referral / Chronic Care Management: CRR required this visit?  No   CCM required this visit?  No      Plan:     I have personally reviewed and noted the following in the patient's chart:   . Medical and social history . Use of alcohol, tobacco or illicit drugs  . Current medications and supplements . Functional ability and status . Nutritional status . Physical activity . Advanced directives . List of other physicians . Hospitalizations, surgeries, and ER visits in previous 12 months . Vitals . Screenings to include cognitive, depression, and falls . Referrals and appointments  In addition, I have reviewed and discussed with patient certain preventive protocols, quality metrics, and best practice recommendations. A written personalized care plan for preventive services as well as general preventive health recommendations were provided to patient.     Roanna RaiderMartha A Herma Uballe, LPN   1/61/09603/31/2022  Nurse Health Advisor  Nurse Notes: None

## 2020-12-22 ENCOUNTER — Other Ambulatory Visit: Payer: Self-pay | Admitting: Cardiology

## 2020-12-23 ENCOUNTER — Ambulatory Visit (HOSPITAL_BASED_OUTPATIENT_CLINIC_OR_DEPARTMENT_OTHER)
Admission: RE | Admit: 2020-12-23 | Discharge: 2020-12-23 | Disposition: A | Discharge status: Home / Self Care | Payer: Medicare HMO | Source: Ambulatory Visit | Attending: Family Medicine | Admitting: Family Medicine

## 2020-12-23 ENCOUNTER — Other Ambulatory Visit: Payer: Self-pay

## 2020-12-23 DIAGNOSIS — Z122 Encounter for screening for malignant neoplasm of respiratory organs: Secondary | ICD-10-CM | POA: Diagnosis not present

## 2020-12-23 DIAGNOSIS — F17210 Nicotine dependence, cigarettes, uncomplicated: Secondary | ICD-10-CM | POA: Diagnosis not present

## 2020-12-23 DIAGNOSIS — I700 Atherosclerosis of aorta: Secondary | ICD-10-CM | POA: Insufficient documentation

## 2020-12-23 DIAGNOSIS — I2510 Atherosclerotic heart disease of native coronary artery without angina pectoris: Secondary | ICD-10-CM | POA: Diagnosis not present

## 2020-12-24 ENCOUNTER — Other Ambulatory Visit: Payer: Self-pay | Admitting: Family Medicine

## 2020-12-24 DIAGNOSIS — I700 Atherosclerosis of aorta: Secondary | ICD-10-CM

## 2021-01-13 ENCOUNTER — Ambulatory Visit: Payer: Self-pay | Admitting: General Surgery

## 2021-01-13 DIAGNOSIS — K642 Third degree hemorrhoids: Secondary | ICD-10-CM | POA: Diagnosis not present

## 2021-01-13 NOTE — H&P (View-Only) (Signed)
  The patient is a 75 year old male who presents with a complaint of Rectal bleeding. 75-year-old male who presents to the office for evaluation of rectal bleeding. He states that he has also had a change in bowel habits with quite a bit of diarrhea. He states that he had a hemorrhoidectomy many years ago but has not had any further treatment for his hemorrhoids. He denies any weight loss or abdominal pain.   Past Surgical History (Kelly Dockery, LPN; 01/13/2021 9:16 AM) Carotid Artery Surgery Left. Cataract Surgery Left. Colon Polyp Removal - Colonoscopy Colon Polyp Removal - Open Coronary Artery Bypass Graft Hemorrhoidectomy Tonsillectomy Vasectomy  Diagnostic Studies History (Kelly Dockery, LPN; 01/13/2021 9:16 AM) Colonoscopy >10 years ago  Allergies (Kelly Dockery, LPN; 01/13/2021 9:17 AM) No Known Drug Allergies [01/13/2021]: Allergies Reconciled  Medication History (Kelly Dockery, LPN; 01/13/2021 9:17 AM) Atorvastatin Calcium (80MG Tablet, Oral) Active. Metoprolol Tartrate (50MG Tablet, Oral) Active. Aspirin (81MG Tablet DR, Oral) Active. Omeprazole (10MG Capsule ER, Oral) Active. Medications Reconciled  Social History (Kelly Dockery, LPN; 01/13/2021 9:16 AM) Alcohol use Moderate alcohol use. Caffeine use Carbonated beverages, Coffee. No drug use Tobacco use Current every day smoker.  Family History (Kelly Dockery, LPN; 01/13/2021 9:16 AM) Alcohol Abuse Father. Heart disease in male family member before age 55  Other Problems (Kelly Dockery, LPN; 01/13/2021 9:16 AM) Back Pain Bladder Problems Congestive Heart Failure Diverticulosis Enlarged Prostate Gastroesophageal Reflux Disease Hemorrhoids Hypercholesterolemia Myocardial infarction Sleep Apnea     Review of Systems (Kelly Dockery LPN; 01/13/2021 9:16 AM) General Present- Night Sweats and Weight Gain. Not Present- Appetite Loss, Chills, Fatigue, Fever and Weight Loss. Skin Not  Present- Change in Wart/Mole, Dryness, Hives, Jaundice, New Lesions, Non-Healing Wounds, Rash and Ulcer. HEENT Present- Wears glasses/contact lenses. Not Present- Earache, Hearing Loss, Hoarseness, Nose Bleed, Oral Ulcers, Ringing in the Ears, Seasonal Allergies, Sinus Pain, Sore Throat, Visual Disturbances and Yellow Eyes. Breast Not Present- Breast Mass, Breast Pain, Nipple Discharge and Skin Changes. Cardiovascular Present- Swelling of Extremities. Not Present- Chest Pain, Difficulty Breathing Lying Down, Leg Cramps, Palpitations, Rapid Heart Rate and Shortness of Breath. Gastrointestinal Present- Gets full quickly at meals, Hemorrhoids and Indigestion. Not Present- Abdominal Pain, Bloating, Bloody Stool, Change in Bowel Habits, Chronic diarrhea, Constipation, Difficulty Swallowing, Excessive gas, Nausea, Rectal Pain and Vomiting. Male Genitourinary Present- Change in Urinary Stream, Frequency, Impotence, Nocturia and Urgency. Not Present- Blood in Urine, Painful Urination and Urine Leakage.  Vitals (Kelly Dockery LPN; 01/13/2021 9:17 AM) 01/13/2021 9:16 AM Weight: 202.8 lb Height: 66in Body Surface Area: 2.01 m Body Mass Index: 32.73 kg/m  Temp.: 97.6F(Tympanic)  Pulse: 80 (Regular)  BP: 132/80(Sitting, Left Arm, Standard)        Physical Exam (Azriel Jakob MD; 01/13/2021 9:35 AM)  General Mental Status-Alert. General Appearance-Cooperative.  Abdomen Palpation/Percussion Palpation and Percussion of the abdomen reveal - Soft and Non Tender.  Rectal Anorectal Exam External - skin tag. Internal - Note: Grade 3 right posterior hemorrhoid.    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 This is not amenable to rubber band ligation. I have recommended hemorrhoidectomy, single column.  Due to his change in bowel habits and rectal bleeding, I also have recommended that he undergo a colonoscopy at some point in the near future. We will make a referral to Dr Shahid in [place] for this. 

## 2021-01-13 NOTE — H&P (Signed)
  The patient is a 75 year old male who presents with a complaint of Rectal bleeding. 75 year old male who presents to the office for evaluation of rectal bleeding. He states that he has also had a change in bowel habits with quite a bit of diarrhea. He states that he had a hemorrhoidectomy many years ago but has not had any further treatment for his hemorrhoids. He denies any weight loss or abdominal pain.   Past Surgical History Michel Bickers, LPN; 12/27/8117 1:47 AM) Carotid Artery Surgery Left. Cataract Surgery Left. Colon Polyp Removal - Colonoscopy Colon Polyp Removal - Open Coronary Artery Bypass Graft Hemorrhoidectomy Tonsillectomy Vasectomy  Diagnostic Studies History Michel Bickers, LPN; 05/18/5620 3:08 AM) Colonoscopy >10 years ago  Allergies Michel Bickers, LPN; 6/57/8469 6:29 AM) No Known Drug Allergies [01/13/2021]: Allergies Reconciled  Medication History Michel Bickers, LPN; 02/15/4131 4:40 AM) Atorvastatin Calcium (80MG  Tablet, Oral) Active. Metoprolol Tartrate (50MG  Tablet, Oral) Active. Aspirin (81MG  Tablet DR, Oral) Active. Omeprazole (10MG  Capsule ER, Oral) Active. Medications Reconciled  Social History , LPN;  AM) Alcohol use Moderate alcohol use. Caffeine use Carbonated beverages, Coffee. No drug use Tobacco use Current every day smoker.  Family History , LPN; Michel Bickers 09/22/7251 AM) Alcohol Abuse Father. Heart disease in male family member before age 37  Other Problems Michel Bickers, LPN; 12/21/4740 5:95 AM) Back Pain Bladder Problems Congestive Heart Failure Diverticulosis Enlarged Prostate Gastroesophageal Reflux Disease Hemorrhoids Hypercholesterolemia Myocardial infarction Sleep Apnea     Review of Systems 53 LPN; Michel Bickers 6/38/7564 AM) General Present- Night Sweats and Weight Gain. Not Present- Appetite Loss, Chills, Fatigue, Fever and Weight Loss. Skin Not  Present- Change in Wart/Mole, Dryness, Hives, Jaundice, New Lesions, Non-Healing Wounds, Rash and Ulcer. HEENT Present- Wears glasses/contact lenses. Not Present- Earache, Hearing Loss, Hoarseness, Nose Bleed, Oral Ulcers, Ringing in the Ears, Seasonal Allergies, Sinus Pain, Sore Throat, Visual Disturbances and Yellow Eyes. Breast Not Present- Breast Mass, Breast Pain, Nipple Discharge and Skin Changes. Cardiovascular Present- Swelling of Extremities. Not Present- Chest Pain, Difficulty Breathing Lying Down, Leg Cramps, Palpitations, Rapid Heart Rate and Shortness of Breath. Gastrointestinal Present- Gets full quickly at meals, Hemorrhoids and Indigestion. Not Present- Abdominal Pain, Bloating, Bloody Stool, Change in Bowel Habits, Chronic diarrhea, Constipation, Difficulty Swallowing, Excessive gas, Nausea, Rectal Pain and Vomiting. Male Genitourinary Present- Change in Urinary Stream, Frequency, Impotence, Nocturia and Urgency. Not Present- Blood in Urine, Painful Urination and Urine Leakage.  Vitals 3:32 Dockery LPN; Michel Bickers 9/51/8841 AM) 01/13/2021 9:16 AM Weight: 202.8 lb Height: 66in Body Surface Area: 2.01 m Body Mass Index: 32.73 kg/m  Temp.: 97.75F(Tympanic)  Pulse: 80 (Regular)  BP: 132/80(Sitting, Left Arm, Standard)        Physical Exam Tresa Endo MD; 01/13/2021 9:35 AM)  General Mental Status-Alert. General Appearance-Cooperative.  Abdomen Palpation/Percussion Palpation and Percussion of the abdomen reveal - Soft and Non Tender.  Rectal Anorectal Exam External - skin tag. Internal - Note: Grade 3 right posterior hemorrhoid.    Assessment & Plan 0:93 MD; 01/13/2021 9:36 AM)  PROLAPSED INTERNAL HEMORRHOIDS, GRADE 3 Romie Levee) Impression: 75 year old male with a significantly symptomatic right posterior grade 3 internal and external hemorrhoid. This is not amenable to rubber band ligation. I have recommended hemorrhoidectomy, single column.  Due to his change in bowel habits and rectal bleeding, I also have recommended that he undergo a colonoscopy at some point in the near future. We will make a referral to Dr Romie Levee in [facility] for this.

## 2021-01-18 ENCOUNTER — Other Ambulatory Visit: Payer: Self-pay | Admitting: Cardiology

## 2021-01-19 ENCOUNTER — Other Ambulatory Visit: Payer: Self-pay

## 2021-01-19 ENCOUNTER — Ambulatory Visit (HOSPITAL_BASED_OUTPATIENT_CLINIC_OR_DEPARTMENT_OTHER)
Admission: RE | Admit: 2021-01-19 | Discharge: 2021-01-19 | Disposition: A | Discharge status: Home / Self Care | Payer: Medicare HMO | Source: Ambulatory Visit | Attending: Cardiology | Admitting: Cardiology

## 2021-01-19 DIAGNOSIS — I6523 Occlusion and stenosis of bilateral carotid arteries: Secondary | ICD-10-CM | POA: Diagnosis not present

## 2021-01-20 ENCOUNTER — Encounter: Payer: Self-pay | Admitting: *Deleted

## 2021-01-20 ENCOUNTER — Other Ambulatory Visit: Payer: Self-pay | Admitting: Family Medicine

## 2021-01-20 DIAGNOSIS — R351 Nocturia: Secondary | ICD-10-CM

## 2021-01-20 DIAGNOSIS — N529 Male erectile dysfunction, unspecified: Secondary | ICD-10-CM

## 2021-01-20 DIAGNOSIS — N401 Enlarged prostate with lower urinary tract symptoms: Secondary | ICD-10-CM

## 2021-01-21 ENCOUNTER — Encounter (HOSPITAL_BASED_OUTPATIENT_CLINIC_OR_DEPARTMENT_OTHER): Payer: Medicare HMO

## 2021-01-29 ENCOUNTER — Other Ambulatory Visit: Payer: Self-pay | Admitting: Family Medicine

## 2021-01-29 DIAGNOSIS — R351 Nocturia: Secondary | ICD-10-CM

## 2021-01-29 DIAGNOSIS — N401 Enlarged prostate with lower urinary tract symptoms: Secondary | ICD-10-CM

## 2021-01-29 DIAGNOSIS — N529 Male erectile dysfunction, unspecified: Secondary | ICD-10-CM

## 2021-02-09 ENCOUNTER — Other Ambulatory Visit: Payer: Self-pay

## 2021-02-09 ENCOUNTER — Other Ambulatory Visit (HOSPITAL_COMMUNITY): Payer: Medicare HMO

## 2021-02-09 ENCOUNTER — Encounter (HOSPITAL_BASED_OUTPATIENT_CLINIC_OR_DEPARTMENT_OTHER): Payer: Self-pay | Admitting: General Surgery

## 2021-02-09 NOTE — Progress Notes (Addendum)
Spoke w/ via phone  For pre-op interview--- p and pt t wife drema per pt request Lab needs dos----I stat              Lab results------see below COVID test -----patient states asymptomatic no test needed Arrive at -------1100 am 02-12-2021 NPO after MN NO Solid Food.  Clear liquids from MN until---1000 am then npo Med rec completed Medications to take morning of surgery -----metoprolol, omeprazole Diabetic medication -----n/a  Patient instructed to bring photo id and insurance card day of surgery Patient aware to have Driver (ride ) / caregiver wife drema will stay    for 24 hours after surgery  Patient Special Instructions -----no smoking 24 hours before surgery Pre-Op special Istructions -----none Patient verbalized understanding of instructions that were given at this phone interview. Patient denies shortness of breath, chest pain, fever, cough or any cardiac S & S   at this phone interview.  Cardiology lov dr Jens Som [phone] epic Nuclear stress 02-02-2017 epic Carotid US bilateral  01-19-2021 epic Chest ct 12-23-2020 epic ekg 12-07-2020 epic/chart

## 2021-02-12 ENCOUNTER — Ambulatory Visit (HOSPITAL_BASED_OUTPATIENT_CLINIC_OR_DEPARTMENT_OTHER)
Admission: RE | Admit: 2021-02-12 | Discharge: 2021-02-12 | Disposition: A | Discharge status: Home / Self Care | Payer: Medicare HMO | Attending: General Surgery | Admitting: General Surgery

## 2021-02-12 ENCOUNTER — Encounter (HOSPITAL_BASED_OUTPATIENT_CLINIC_OR_DEPARTMENT_OTHER)
Admission: RE | Disposition: A | Discharge status: Home / Self Care | Payer: Self-pay | Source: Home / Self Care | Attending: General Surgery

## 2021-02-12 ENCOUNTER — Ambulatory Visit (HOSPITAL_BASED_OUTPATIENT_CLINIC_OR_DEPARTMENT_OTHER): Payer: Medicare HMO | Admitting: Anesthesiology

## 2021-02-12 ENCOUNTER — Encounter (HOSPITAL_BASED_OUTPATIENT_CLINIC_OR_DEPARTMENT_OTHER): Payer: Self-pay | Admitting: General Surgery

## 2021-02-12 ENCOUNTER — Other Ambulatory Visit: Payer: Self-pay

## 2021-02-12 DIAGNOSIS — K642 Third degree hemorrhoids: Secondary | ICD-10-CM | POA: Insufficient documentation

## 2021-02-12 DIAGNOSIS — R194 Change in bowel habit: Secondary | ICD-10-CM | POA: Diagnosis not present

## 2021-02-12 DIAGNOSIS — K625 Hemorrhage of anus and rectum: Secondary | ICD-10-CM | POA: Insufficient documentation

## 2021-02-12 DIAGNOSIS — K626 Ulcer of anus and rectum: Secondary | ICD-10-CM | POA: Diagnosis not present

## 2021-02-12 DIAGNOSIS — Z79899 Other long term (current) drug therapy: Secondary | ICD-10-CM | POA: Insufficient documentation

## 2021-02-12 DIAGNOSIS — F17200 Nicotine dependence, unspecified, uncomplicated: Secondary | ICD-10-CM | POA: Insufficient documentation

## 2021-02-12 DIAGNOSIS — K644 Residual hemorrhoidal skin tags: Secondary | ICD-10-CM | POA: Diagnosis not present

## 2021-02-12 DIAGNOSIS — Z951 Presence of aortocoronary bypass graft: Secondary | ICD-10-CM | POA: Diagnosis not present

## 2021-02-12 DIAGNOSIS — Z7982 Long term (current) use of aspirin: Secondary | ICD-10-CM | POA: Diagnosis not present

## 2021-02-12 DIAGNOSIS — R351 Nocturia: Secondary | ICD-10-CM | POA: Diagnosis not present

## 2021-02-12 DIAGNOSIS — E782 Mixed hyperlipidemia: Secondary | ICD-10-CM | POA: Diagnosis not present

## 2021-02-12 DIAGNOSIS — K6289 Other specified diseases of anus and rectum: Secondary | ICD-10-CM | POA: Diagnosis not present

## 2021-02-12 DIAGNOSIS — N401 Enlarged prostate with lower urinary tract symptoms: Secondary | ICD-10-CM | POA: Diagnosis not present

## 2021-02-12 HISTORY — PX: HEMORRHOID SURGERY: SHX153

## 2021-02-12 HISTORY — DX: Presence of spectacles and contact lenses: Z97.3

## 2021-02-12 HISTORY — DX: Presence of dental prosthetic device (complete) (partial): Z97.2

## 2021-02-12 HISTORY — DX: Unspecified hemorrhoids: K64.9

## 2021-02-12 LAB — POCT I-STAT, CHEM 8
BUN: 9 mg/dL (ref 8–23)
Calcium, Ion: 1.18 mmol/L (ref 1.15–1.40)
Chloride: 106 mmol/L (ref 98–111)
Creatinine, Ser: 1.2 mg/dL (ref 0.61–1.24)
Glucose, Bld: 114 mg/dL — ABNORMAL HIGH (ref 70–99)
HCT: 44 % (ref 39.0–52.0)
Hemoglobin: 15 g/dL (ref 13.0–17.0)
Potassium: 3.9 mmol/L (ref 3.5–5.1)
Sodium: 141 mmol/L (ref 135–145)
TCO2: 23 mmol/L (ref 22–32)

## 2021-02-12 SURGERY — HEMORRHOIDECTOMY
Anesthesia: General | Site: Rectum

## 2021-02-12 MED ORDER — ACETAMINOPHEN 325 MG RE SUPP: 650.0000 mg | RECTAL | Status: DC | PRN

## 2021-02-12 MED ORDER — SODIUM CHLORIDE 0.9% FLUSH: 3.0000 mL | INTRAVENOUS | Status: DC | PRN

## 2021-02-12 MED ORDER — SODIUM CHLORIDE 0.9% FLUSH: 3.0000 mL | Freq: Two times a day (BID) | INTRAVENOUS | Status: DC

## 2021-02-12 MED ORDER — SODIUM CHLORIDE 0.9 % IV SOLN: 250.0000 mL | INTRAVENOUS | Status: DC | PRN

## 2021-02-12 MED ORDER — LIDOCAINE 2% (20 MG/ML) 5 ML SYRINGE
INTRAMUSCULAR | Status: AC
  Filled 2021-02-12: qty 5

## 2021-02-12 MED ORDER — DEXAMETHASONE SODIUM PHOSPHATE 10 MG/ML IJ SOLN
INTRAMUSCULAR | Status: AC
  Filled 2021-02-12: qty 1

## 2021-02-12 MED ORDER — ONDANSETRON HCL 4 MG/2ML IJ SOLN
INTRAMUSCULAR | Status: DC | PRN
  Administered 2021-02-12: 4 mg via INTRAVENOUS

## 2021-02-12 MED ORDER — PROPOFOL 10 MG/ML IV BOLUS
INTRAVENOUS | Status: DC | PRN
  Administered 2021-02-12: 50 mg via INTRAVENOUS
  Administered 2021-02-12: 120 mg via INTRAVENOUS

## 2021-02-12 MED ORDER — DEXAMETHASONE SODIUM PHOSPHATE 10 MG/ML IJ SOLN
INTRAMUSCULAR | Status: DC | PRN
  Administered 2021-02-12: 10 mg via INTRAVENOUS

## 2021-02-12 MED ORDER — SUGAMMADEX SODIUM 200 MG/2ML IV SOLN
INTRAVENOUS | Status: DC | PRN
  Administered 2021-02-12: 200 mg via INTRAVENOUS

## 2021-02-12 MED ORDER — OXYCODONE HCL 5 MG PO TABS: 5.0000 mg | ORAL_TABLET | Freq: Four times a day (QID) | ORAL | 0 refills | Status: DC | PRN

## 2021-02-12 MED ORDER — OXYCODONE HCL 5 MG PO TABS: 5.0000 mg | ORAL_TABLET | ORAL | Status: DC | PRN

## 2021-02-12 MED ORDER — ROCURONIUM BROMIDE 10 MG/ML (PF) SYRINGE
PREFILLED_SYRINGE | INTRAVENOUS | Status: AC
  Filled 2021-02-12: qty 10

## 2021-02-12 MED ORDER — PROPOFOL 10 MG/ML IV BOLUS
INTRAVENOUS | Status: AC
  Filled 2021-02-12: qty 20

## 2021-02-12 MED ORDER — BUPIVACAINE LIPOSOME 1.3 % IJ SUSP
INTRAMUSCULAR | Status: DC | PRN
  Administered 2021-02-12: 20 mL

## 2021-02-12 MED ORDER — ROCURONIUM BROMIDE 10 MG/ML (PF) SYRINGE
PREFILLED_SYRINGE | INTRAVENOUS | Status: DC | PRN
  Administered 2021-02-12: 50 mg via INTRAVENOUS

## 2021-02-12 MED ORDER — LIDOCAINE 2% (20 MG/ML) 5 ML SYRINGE
INTRAMUSCULAR | Status: DC | PRN
  Administered 2021-02-12: 50 mg via INTRAVENOUS
  Administered 2021-02-12: 100 mg via INTRAVENOUS

## 2021-02-12 MED ORDER — FENTANYL CITRATE (PF) 100 MCG/2ML IJ SOLN
INTRAMUSCULAR | Status: DC | PRN
  Administered 2021-02-12: 100 ug via INTRAVENOUS

## 2021-02-12 MED ORDER — ACETAMINOPHEN 500 MG PO TABS
1000.0000 mg | ORAL_TABLET | ORAL | Status: AC
  Administered 2021-02-12: 1000 mg via ORAL

## 2021-02-12 MED ORDER — LACTATED RINGERS IV SOLN: INTRAVENOUS | Status: DC

## 2021-02-12 MED ORDER — ACETAMINOPHEN 500 MG PO TABS
ORAL_TABLET | ORAL | Status: AC
  Filled 2021-02-12: qty 2

## 2021-02-12 MED ORDER — LIDOCAINE 5 % EX OINT
TOPICAL_OINTMENT | CUTANEOUS | Status: DC | PRN
  Administered 2021-02-12: 1

## 2021-02-12 MED ORDER — BUPIVACAINE-EPINEPHRINE 0.5% -1:200000 IJ SOLN
INTRAMUSCULAR | Status: DC | PRN
  Administered 2021-02-12: 30 mL

## 2021-02-12 MED ORDER — FENTANYL CITRATE (PF) 250 MCG/5ML IJ SOLN
INTRAMUSCULAR | Status: AC
  Filled 2021-02-12: qty 5

## 2021-02-12 MED ORDER — ONDANSETRON HCL 4 MG/2ML IJ SOLN
INTRAMUSCULAR | Status: AC
  Filled 2021-02-12: qty 2

## 2021-02-12 MED ORDER — ACETAMINOPHEN 325 MG PO TABS: 650.0000 mg | ORAL_TABLET | ORAL | Status: DC | PRN

## 2021-02-12 SURGICAL SUPPLY — 38 items
BLADE EXTENDED COATED 6.5IN (ELECTRODE) IMPLANT
BLADE HEX COATED 2.75 (ELECTRODE) IMPLANT
BLADE SURG 10 STRL SS (BLADE) IMPLANT
BRIEF STRETCH FOR OB PAD LRG (UNDERPADS AND DIAPERS) ×2 IMPLANT
COVER BACK TABLE 60X90IN (DRAPES) ×2 IMPLANT
COVER MAYO STAND STRL (DRAPES) ×2 IMPLANT
COVER WAND RF STERILE (DRAPES) ×2 IMPLANT
DRAPE LAPAROTOMY 100X72 PEDS (DRAPES) ×2 IMPLANT
DRAPE UTILITY XL STRL (DRAPES) ×2 IMPLANT
DRSG PAD ABDOMINAL 8X10 ST (GAUZE/BANDAGES/DRESSINGS) ×2 IMPLANT
ELECT REM PT RETURN 9FT ADLT (ELECTROSURGICAL) ×2
ELECTRODE REM PT RTRN 9FT ADLT (ELECTROSURGICAL) ×1 IMPLANT
GAUZE SPONGE 4X4 12PLY STRL (GAUZE/BANDAGES/DRESSINGS) ×2 IMPLANT
GLOVE SURG ENC MOIS LTX SZ6.5 (GLOVE) ×2 IMPLANT
GLOVE SURG ENC MOIS LTX SZ7 (GLOVE) ×2 IMPLANT
GLOVE SURG UNDER POLY LF SZ7 (GLOVE) ×4 IMPLANT
GOWN STRL REUS W/TWL LRG LVL3 (GOWN DISPOSABLE) ×2 IMPLANT
KIT SIGMOIDOSCOPE (SET/KITS/TRAYS/PACK) IMPLANT
KIT TURNOVER CYSTO (KITS) ×2 IMPLANT
MANIFOLD NEPTUNE II (INSTRUMENTS) ×2 IMPLANT
NEEDLE HYPO 22GX1.5 SAFETY (NEEDLE) ×2 IMPLANT
NS IRRIG 500ML POUR BTL (IV SOLUTION) ×4 IMPLANT
PACK BASIN DAY SURGERY FS (CUSTOM PROCEDURE TRAY) ×2 IMPLANT
PAD ARMBOARD 7.5X6 YLW CONV (MISCELLANEOUS) IMPLANT
PENCIL SMOKE EVACUATOR (MISCELLANEOUS) ×2 IMPLANT
SPONGE SURGIFOAM ABS GEL 100 (HEMOSTASIS) IMPLANT
SPONGE SURGIFOAM ABS GEL 12-7 (HEMOSTASIS) IMPLANT
SUT CHROMIC 2 0 SH (SUTURE) ×2 IMPLANT
SUT CHROMIC 3 0 SH 27 (SUTURE) ×4 IMPLANT
SUT VIC AB 2-0 SH 27 (SUTURE)
SUT VIC AB 2-0 SH 27XBRD (SUTURE) IMPLANT
SUT VIC AB 4-0 SH 18 (SUTURE) IMPLANT
SYR CONTROL 10ML LL (SYRINGE) ×2 IMPLANT
TOWEL OR 17X26 10 PK STRL BLUE (TOWEL DISPOSABLE) ×2 IMPLANT
TRAY DSU PREP LF (CUSTOM PROCEDURE TRAY) ×2 IMPLANT
TUBE CONNECTING 12X1/4 (SUCTIONS) ×2 IMPLANT
WATER STERILE IRR 500ML POUR (IV SOLUTION) IMPLANT
YANKAUER SUCT BULB TIP NO VENT (SUCTIONS) ×2 IMPLANT

## 2021-02-12 NOTE — Op Note (Signed)
02/12/2021  1:29 PM  PATIENT:  Ethan Estrada  75 y.o. male  Patient Care Team: Sharlene Dory, DO as PCP - General (Family Medicine) Jens Som Madolyn Frieze, MD as PCP - Cardiology (Cardiology)  PRE-OPERATIVE DIAGNOSIS:  GRADE 3 HEMORRHOID  POST-OPERATIVE DIAGNOSIS:  GRADE 3 HEMORRHOID  PROCEDURE: HEMORRHOIDECTOMY SINGLE COLUMN    Surgeon(s): Romie Levee, MD  ASSISTANT: none   ANESTHESIA:   local and general  SPECIMEN:  Source of Specimen:  R anterior heomrrhoid, R lateral anal canal ulceration  DISPOSITION OF SPECIMEN:  PATHOLOGY  COUNTS:  YES  PLAN OF CARE: Discharge to home after PACU  PATIENT DISPOSITION:  PACU - hemodynamically stable.  INDICATION: 75 y.o. M with continued rectal bleeding and a grade 3 heomrrhoid   OR FINDINGS: Grade 3 right anterior hemorrhoid, ulcerative lesion in the right lateral anal canal  DESCRIPTION: the patient was identified in the preoperative holding area and taken to the OR where they were laid on the operating room table.  General anesthesia was induced without difficulty. The patient was then positioned in prone jackknife position with buttocks gently taped apart.  The patient was then prepped and draped in usual sterile fashion.  SCDs were noted to be in place prior to the initiation of anesthesia. A surgical timeout was performed indicating the correct patient, procedure, positioning and need for preoperative antibiotics.  A rectal block was performed using Marcaine with epinephrine mixed with Exparel.    I began with a digital rectal exam.  There was a small ulcerative lesion in the right lateral anal canal.  No other masses were noted.  I then placed a Hill-Ferguson anoscope into the anal canal and evaluated this completely.  There was a grade 3 hemorrhoid in the right anterior anal canal.  Grade 1 right posterior internal hemorrhoid and grade 2 left lateral hemorrhoid.  I began by elevating the external hemorrhoid column of the  right anterior hemorrhoid using an Allis clamp.  Metzenbaum scissors were used to incise the skin and dissect down to the level of the sphincter complex.  Blunt dissection was used to separate the sphincter complex from the overlying skin and hemorrhoidal tissue.  Once this was done the edges were trimmed using Metzenbaum scissors.  The hemorrhoid was sent to pathology for further examination.  I used a 2-0 chromic running suture to close the anoderm.  I then used a 3-0 chromic suture to close the anal skin distal to the dentate line.  Hemostasis was good.  I decided to remove the ulcerative lesion in the right lateral canal.  This was done using Metzenbaum scissors and the site was closed using a figure-of-eight 3-0 chromic suture.  Once this was complete, the entire anal canal was inspected for hemostasis.  There was no active bleeding.  Additional Marcaine block was placed around the hemorrhoidectomy site.  Lidocaine ointment and sterile dressing were also applied.  The patient was then awakened from anesthesia and sent to the postanesthesia care unit in stable condition.  All counts were correct per operating room staff.

## 2021-02-12 NOTE — Anesthesia Postprocedure Evaluation (Signed)
Anesthesia Post Note  Patient: Ethan Estrada  Procedure(s) Performed: HEMORRHOIDECTOMY SINGLE COLUMN (N/A Rectum)     Patient location during evaluation: PACU Anesthesia Type: General Level of consciousness: awake Pain management: pain level controlled Vital Signs Assessment: post-procedure vital signs reviewed and stable Respiratory status: spontaneous breathing Cardiovascular status: stable Postop Assessment: no apparent nausea or vomiting Anesthetic complications: no   No complications documented.  Last Vitals:  Vitals:   02/12/21 1400 02/12/21 1410  BP: 133/72 (!) 158/76  Pulse: 89 96  Resp: 16 18  Temp: (!) 36.4 C 36.4 C  SpO2: 97% 98%    Last Pain:  Vitals:   02/12/21 1400  TempSrc:   PainSc: 0-No pain                 Merrisa Skorupski

## 2021-02-12 NOTE — Anesthesia Preprocedure Evaluation (Addendum)
Anesthesia Evaluation  Patient identified by MRN, date of birth, ID band Patient awake    Reviewed: Allergy & Precautions, NPO status , Patient's Chart, lab work & pertinent test results  Airway Mallampati: II  TM Distance: >3 FB     Dental   Pulmonary neg pulmonary ROS, Current Smoker and Patient abstained from smoking.,    breath sounds clear to auscultation       Cardiovascular + CAD and + Past MI   Rhythm:Regular Rate:Normal     Neuro/Psych negative neurological ROS     GI/Hepatic Neg liver ROS, GERD  ,  Endo/Other    Renal/GU Renal disease     Musculoskeletal   Abdominal   Peds  Hematology   Anesthesia Other Findings   Reproductive/Obstetrics                            Anesthesia Physical Anesthesia Plan  ASA: III  Anesthesia Plan: General   Post-op Pain Management:    Induction: Intravenous  PONV Risk Score and Plan: 2 and Ondansetron, Dexamethasone and Midazolam  Airway Management Planned: Oral ETT  Additional Equipment:   Intra-op Plan:   Post-operative Plan: Extubation in OR  Informed Consent: I have reviewed the patients History and Physical, chart, labs and discussed the procedure including the risks, benefits and alternatives for the proposed anesthesia with the patient or authorized representative who has indicated his/her understanding and acceptance.     Dental advisory given  Plan Discussed with: CRNA and Anesthesiologist  Anesthesia Plan Comments:         Anesthesia Quick Evaluation

## 2021-02-12 NOTE — Interval H&P Note (Signed)
History and Physical Interval Note:  02/12/2021 11:40 AM  Ethan Estrada  has presented today for surgery, with the diagnosis of GRADE 3 HEMORRHOID.  The various methods of treatment have been discussed with the patient and family. After consideration of risks, benefits and other options for treatment, the patient has consented to  Procedure(s): HEMORRHOIDECTOMY SINGLE COLUMN (N/A) as a surgical intervention.  The patient's history has been reviewed, patient examined, no change in status, stable for surgery.  I have reviewed the patient's chart and labs.  Questions were answered to the patient's satisfaction.    Vanita Panda, MD  Colorectal and General Surgery [facility]

## 2021-02-12 NOTE — Transfer of Care (Signed)
Immediate Anesthesia Transfer of Care Note  Patient: Ethan Estrada  Procedure(s) Performed: HEMORRHOIDECTOMY SINGLE COLUMN (N/A Rectum)  Patient Location: PACU  Anesthesia Type:General  Level of Consciousness: awake, alert , oriented and patient cooperative  Airway & Oxygen Therapy: Patient Spontanous Breathing  Post-op Assessment: Report given to RN and Post -op Vital signs reviewed and stable  Post vital signs: Reviewed and stable  Last Vitals:  Vitals Value Taken Time  BP 153/70 02/12/21 1345  Temp 36.6 C 02/12/21 1342  Pulse 102 02/12/21 1347  Resp 15 02/12/21 1347  SpO2 98 % 02/12/21 1347  Vitals shown include unvalidated device data.  Last Pain:  Vitals:   02/12/21 1342  TempSrc:   PainSc: 0-No pain         Complications: No complications documented.

## 2021-02-12 NOTE — Anesthesia Procedure Notes (Signed)
Procedure Name: Intubation Date/Time: 02/12/2021 1:02 PM Performed by: Rogers Blocker, CRNA Pre-anesthesia Checklist: Patient identified, Emergency Drugs available, Suction available and Patient being monitored Patient Re-evaluated:Patient Re-evaluated prior to induction Oxygen Delivery Method: Circle System Utilized Preoxygenation: Pre-oxygenation with 100% oxygen Induction Type: IV induction Ventilation: Mask ventilation without difficulty Laryngoscope Size: Mac and 4 Grade View: Grade I Tube type: Oral Tube size: 7.5 mm Number of attempts: 1 Airway Equipment and Method: Stylet Placement Confirmation: ETT inserted through vocal cords under direct vision,  positive ETCO2 and breath sounds checked- equal and bilateral Secured at: 22 cm Tube secured with: Tape Dental Injury: Teeth and Oropharynx as per pre-operative assessment

## 2021-02-12 NOTE — Discharge Instructions (Addendum)
ANORECTAL SURGERY: POST OP INSTRUCTIONS 1. Take your usually prescribed home medications unless otherwise directed. 2. DIET: During the first few hours after surgery sip on some liquids until you are able to urinate.  It is normal to not urinate for several hours after this surgery.  If you feel uncomfortable, please contact the office for instructions.  After you are able to urinate,you may eat, if you feel like it.  Follow a light bland diet the first 24 hours after arrival home, such as soup, liquids, crackers, etc.  Be sure to include lots of fluids daily (6-8 glasses).  Avoid fast food or heavy meals, as your are more likely to get nauseated.  Eat a low fat diet the next few days after surgery.  Limit caffeine intake to 1-2 servings a day. 3. PAIN CONTROL: a. Pain is best controlled by a usual combination of several different methods TOGETHER: i. Muscle relaxation: Soak in a warm bath (or Sitz bath) three times a day and after bowel movements.  Continue to do this until all pain is resolved. ii. Over the counter pain medication iii. Prescription pain medication b. Most patients will experience some swelling and discomfort in the anus/rectal area and incisions.  Heat such as warm towels, sitz baths, warm baths, etc to help relax tight/sore spots and speed recovery.  Some people prefer to use ice, especially in the first couple days after surgery, as it may decrease the pain and swelling, or alternate between ice & heat.  Experiment to what works for you.  Swelling and bruising can take several weeks to resolve.  Pain can take even longer to completely resolve. c. It is helpful to take an over-the-counter pain medication regularly for the first few weeks.  Choose one of the following that works best for you: i. Naproxen (Aleve, etc)  Two 220mg tabs twice a day ii. Ibuprofen (Advil, etc) Three 200mg tabs four times a day (every meal & bedtime) d. A  prescription for pain medication (such as percocet,  oxycodone, hydrocodone, etc) should be given to you upon discharge.  Take your pain medication as prescribed.  i. If you are having problems/concerns with the prescription medicine (does not control pain, nausea, vomiting, rash, itching, etc), please call us [phone] to see if we need to switch you to a different pain medicine that will work better for you and/or control your side effect better. ii. If you need a refill on your pain medication, please contact your pharmacy.  They will contact our office to request authorization. Prescriptions will not be filled after 5 pm or on week-ends. 4. KEEP YOUR BOWELS REGULAR and AVOID CONSTIPATION a. The goal is one to two soft bowel movements a day.  You should at least have a bowel movement every other day. b. Avoid getting constipated.  Between the surgery and the pain medications, it is common to experience some constipation. This can be very painful after rectal surgery.  Increasing fluid intake and taking a fiber supplement (such as Metamucil, Citrucel, FiberCon, etc) 1-2 times a day regularly will usually help prevent this problem from occurring.  A stool softener like colace is also recommended.  This can be purchased over the counter at your pharmacy.  You can take it up to 3 times a day.  If you do not have a bowel movement after 24 hrs since your surgery, take one does of milk of magnesia.  If you still haven't had a bowel movement 8-12 hours after   that dose, take another dose.  If you don't have a bowel movement 48 hrs after surgery, purchase a Fleets enema from the drug store and administer gently per package instructions.  If you still are having trouble with your bowel movements after that, please call the office for further instructions. c. If you develop diarrhea or have many loose bowel movements, simplify your diet to bland foods & liquids for a few days.  Stop any stool softeners and decrease your fiber supplement.  Switching to mild  anti-diarrheal medications (Kayopectate, Pepto Bismol) can help.  If this worsens or does not improve, please call us.  5. Wound Care a. Remove your bandages before your first bowel movement or 8 hours after surgery.     b. Remove any wound packing material at this tim,e as well.  You do not need to repack the wound unless instructed otherwise.  Wear an absorbent pad or soft cotton gauze in your underwear to catch any drainage and help keep the area clean. You should change this every 2-3 hours while awake. c. Keep the area clean and dry.  Bathe / shower every day, especially after bowel movements.  Keep the area clean by showering / bathing over the incision / wound.   It is okay to soak an open wound to help wash it.  Wet wipes or showers / gentle washing after bowel movements is often less traumatic than regular toilet paper. d. Bonita Quin may have some styrofoam-like soft packing in the rectum which will come out with the first bowel movement.  e. You will often notice bleeding with bowel movements.  This should slow down by the end of the first week of surgery f. Expect some drainage.  This should slow down, too, by the end of the first week of surgery.  Wear an absorbent pad or soft cotton gauze in your underwear until the drainage stops. g. Do Not sit on a rubber or pillow ring.  This can make you symptoms worse.  You may sit on a soft pillow if needed.  6. ACTIVITIES as tolerated:   a. You may resume regular (light) daily activities beginning the next day--such as daily self-care, walking, climbing stairs--gradually increasing activities as tolerated.  If you can walk 30 minutes without difficulty, it is safe to try more intense activity such as jogging, treadmill, bicycling, low-impact aerobics, swimming, etc. b. Save the most intensive and strenuous activity for last such as sit-ups, heavy lifting, contact sports, etc  Refrain from any heavy lifting or straining until you are off narcotics for pain  control.   c. You may drive when you are no longer taking prescription pain medication, you can comfortably sit for long periods of time, and you can safely maneuver your car and apply brakes. d. Bonita Quin may have sexual intercourse when it is comfortable.  7. FOLLOW UP in our office a. Please call [facility] at [phone] to set up an appointment to see your surgeon in the office for a follow-up appointment approximately 3-4 weeks after your surgery. b. Make sure that you call for this appointment the day you arrive home to insure a convenient appointment time. 10. IF YOU HAVE DISABILITY OR FAMILY LEAVE FORMS, BRING THEM TO THE OFFICE FOR PROCESSING.  DO NOT GIVE THEM TO YOUR DOCTOR.  WHEN TO CALL us [phone]: 1. Poor pain control 2. Reactions / problems with new medications (rash/itching, nausea, etc)  3. Fever over 101.5 F (38.5 C) 4. Inability to urinate  5. Nausea and/or vomiting 6. Worsening swelling or bruising 7. Continued bleeding from incision. 8. Increased pain, redness, or drainage from the incision  The clinic staff is available to answer your questions during regular business hours (8:30am-5pm).  Please don't hesitate to call and ask to speak to one of our nurses for clinical concerns.   A surgeon from [facility] is always on call at the hospitals   If you have a medical emergency, go to the nearest emergency room or call 911.    [facility] [address], [place], [place]  [address] ? MAIN: [phone] ? TOLL FREE: [phone] ? FAX [phone] [email or web address]  Post Anesthesia Home Care Instructions  Activity: Get plenty of rest for the remainder of the day. A responsible individual must stay with you for 24 hours following the procedure.  For the next 24 hours, DO NOT: -Drive a car -Advertising copywriter -Drink alcoholic beverages -Take any medication unless instructed by your physician -Make any  legal decisions or sign important papers.  Meals: Start with liquid foods such as gelatin or soup. Progress to regular foods as tolerated. Avoid greasy, spicy, heavy foods. If nausea and/or vomiting occur, drink only clear liquids until the nausea and/or vomiting subsides. Call your physician if vomiting continues.  Special Instructions/Symptoms: Your throat may feel dry or sore from the anesthesia or the breathing tube placed in your throat during surgery. If this causes discomfort, gargle with warm salt water. The discomfort should disappear within 24 hours.  Information for Discharge Teaching: EXPAREL (bupivacaine liposome injectable suspension)   Your surgeon or anesthesiologist gave you EXPAREL(bupivacaine) to help control your pain after surgery.   EXPAREL is a local anesthetic that provides pain relief by numbing the tissue around the surgical site.  EXPAREL is designed to release pain medication over time and can control pain for up to 72 hours.  Depending on how you respond to EXPAREL, you may require less pain medication during your recovery.  Possible side effects:  Temporary loss of sensation or ability to move in the area where bupivacaine was injected.  Nausea, vomiting, constipation  Rarely, numbness and tingling in your mouth or lips, lightheadedness, or anxiety may occur.  Call your doctor right away if you think you may be experiencing any of these sensations, or if you have other questions regarding possible side effects.  Follow all other discharge instructions given to you by your surgeon or nurse. Eat a healthy diet and drink plenty of water or other fluids.  If you return to the hospital for any reason within 96 hours following the administration of EXPAREL, it is important for health care providers to know that you have received this anesthetic. A teal colored band has been placed on your arm with the date, time and amount of EXPAREL you have received in order  to alert and inform your health care providers. Please leave this armband in place for the full 96 hours following administration, and then you may remove the band.

## 2021-02-13 ENCOUNTER — Encounter (HOSPITAL_BASED_OUTPATIENT_CLINIC_OR_DEPARTMENT_OTHER): Payer: Self-pay | Admitting: General Surgery

## 2021-02-13 LAB — SURGICAL PATHOLOGY

## 2021-03-13 ENCOUNTER — Telehealth: Payer: Self-pay | Admitting: Family Medicine

## 2021-03-13 DIAGNOSIS — N529 Male erectile dysfunction, unspecified: Secondary | ICD-10-CM

## 2021-03-13 DIAGNOSIS — N401 Enlarged prostate with lower urinary tract symptoms: Secondary | ICD-10-CM

## 2021-03-13 MED ORDER — TADALAFIL 5 MG PO TABS: 5.0000 mg | ORAL_TABLET | Freq: Every day | ORAL | 0 refills | Status: DC

## 2021-03-13 NOTE — Telephone Encounter (Signed)
Refill done.  

## 2021-03-13 NOTE — Telephone Encounter (Signed)
Medication: tadalafil (CIALIS) 5 MG tablet [[id number]]      Has the patient contacted their pharmacy? No. (If no, request that the patient contact the pharmacy for the refill.) (If yes, when and what did the pharmacy advise?) No refills    Preferred Pharmacy (with phone number or street name):  [id number] - [place], [place] - [address]  [address], [place] [address]  Phone:  [phone]  Fax:  [phone]     Agent: Please be advised that RX refills may take up to 3 business days. We ask that you follow-up with your pharmacy.

## 2021-04-14 ENCOUNTER — Telehealth: Payer: Self-pay | Admitting: Family Medicine

## 2021-04-14 DIAGNOSIS — N401 Enlarged prostate with lower urinary tract symptoms: Secondary | ICD-10-CM

## 2021-04-14 DIAGNOSIS — N529 Male erectile dysfunction, unspecified: Secondary | ICD-10-CM

## 2021-04-14 DIAGNOSIS — R351 Nocturia: Secondary | ICD-10-CM

## 2021-04-14 MED ORDER — TADALAFIL 5 MG PO TABS: 5.0000 mg | ORAL_TABLET | Freq: Every day | ORAL | 0 refills | Status: DC

## 2021-04-14 NOTE — Telephone Encounter (Signed)
Refilled prescription 

## 2021-04-14 NOTE — Telephone Encounter (Signed)
Medication: tadalafil (CIALIS) 5 MG tablet [[id number]]      Has the patient contacted their pharmacy? No. (If no, request that the patient contact the pharmacy for the refill.) (If yes, when and what did the pharmacy advise?)     Preferred Pharmacy (with phone number or street name): [id number] - [place], [place] - [address]  [address], [place] [address]  Phone:  [phone]  Fax:  [phone]  DEA #:  --     Agent: Please be advised that RX refills may take up to 3 business days. We ask that you follow-up with your pharmacy.

## 2021-06-14 ENCOUNTER — Other Ambulatory Visit: Payer: Self-pay | Admitting: Family Medicine

## 2021-06-14 DIAGNOSIS — N401 Enlarged prostate with lower urinary tract symptoms: Secondary | ICD-10-CM

## 2021-06-14 DIAGNOSIS — R351 Nocturia: Secondary | ICD-10-CM

## 2021-06-14 DIAGNOSIS — N529 Male erectile dysfunction, unspecified: Secondary | ICD-10-CM

## 2021-06-15 ENCOUNTER — Other Ambulatory Visit: Payer: Self-pay | Admitting: Family Medicine

## 2021-06-15 DIAGNOSIS — N529 Male erectile dysfunction, unspecified: Secondary | ICD-10-CM

## 2021-06-15 DIAGNOSIS — N401 Enlarged prostate with lower urinary tract symptoms: Secondary | ICD-10-CM

## 2021-07-14 ENCOUNTER — Other Ambulatory Visit: Payer: Self-pay | Admitting: Family Medicine

## 2021-07-14 DIAGNOSIS — N401 Enlarged prostate with lower urinary tract symptoms: Secondary | ICD-10-CM

## 2021-07-14 DIAGNOSIS — N529 Male erectile dysfunction, unspecified: Secondary | ICD-10-CM

## 2021-07-14 DIAGNOSIS — R351 Nocturia: Secondary | ICD-10-CM

## 2021-08-16 ENCOUNTER — Other Ambulatory Visit: Payer: Self-pay | Admitting: Family Medicine

## 2021-08-16 DIAGNOSIS — N529 Male erectile dysfunction, unspecified: Secondary | ICD-10-CM

## 2021-08-16 DIAGNOSIS — N401 Enlarged prostate with lower urinary tract symptoms: Secondary | ICD-10-CM

## 2021-08-17 ENCOUNTER — Other Ambulatory Visit: Payer: Self-pay | Admitting: Family Medicine

## 2021-08-17 DIAGNOSIS — R351 Nocturia: Secondary | ICD-10-CM

## 2021-08-17 DIAGNOSIS — N529 Male erectile dysfunction, unspecified: Secondary | ICD-10-CM

## 2021-08-18 IMAGING — CT CT CHEST LUNG CANCER SCREENING LOW DOSE W/O CM
2 of 3 series · 15 of 36 positions shown, 18 images · non-contrast
Comparison: None.

CLINICAL DATA: Current smoker, 100 pack-year history.

EXAM:
CT CHEST WITHOUT CONTRAST LOW-DOSE FOR LUNG CANCER SCREENING
TECHNIQUE: Multidetector CT imaging of the chest was performed following the
standard protocol without IV contrast.

[Series 2: axial st · axial · 0.78mm/px · z∈[-332,-72]mm · 12 of 62 slices shown, 15 images]
[im 5/62  mediastinal]
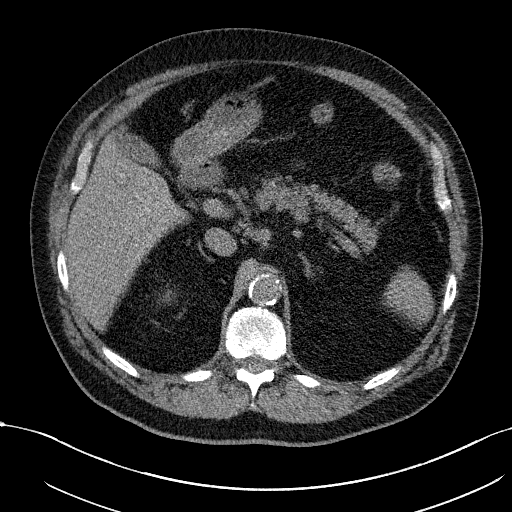
[im 5/62  lung]
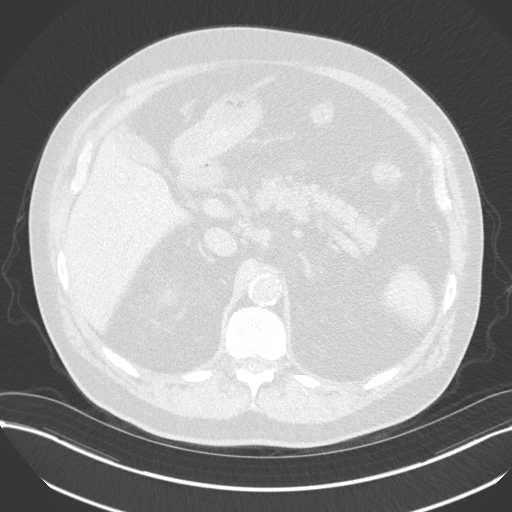
[im 10/62  lung]
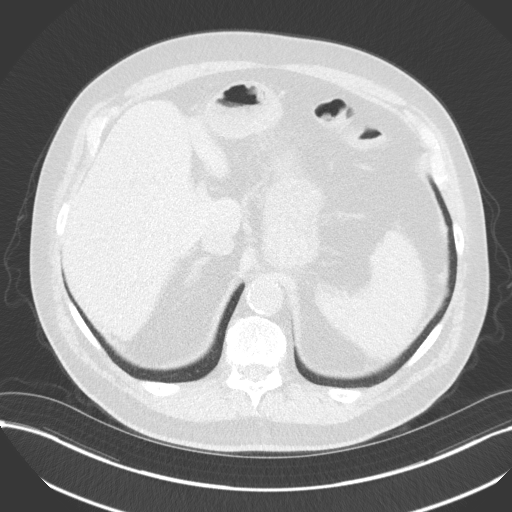
[im 14/62  lung]
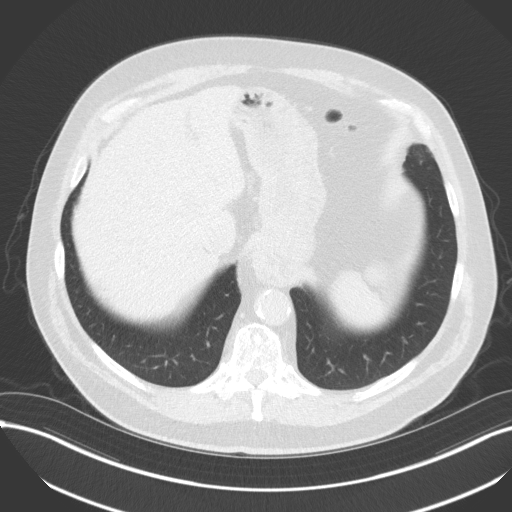
[im 19/62  lung]
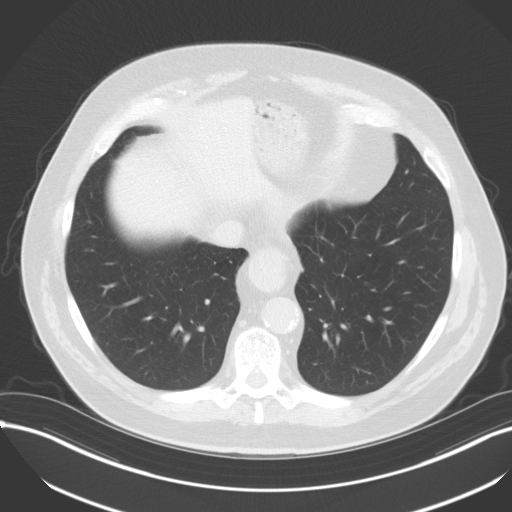
[im 23/62  mediastinal]
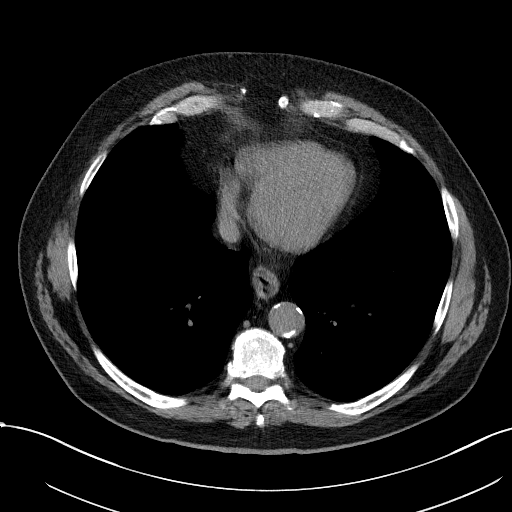
[im 23/62  lung]
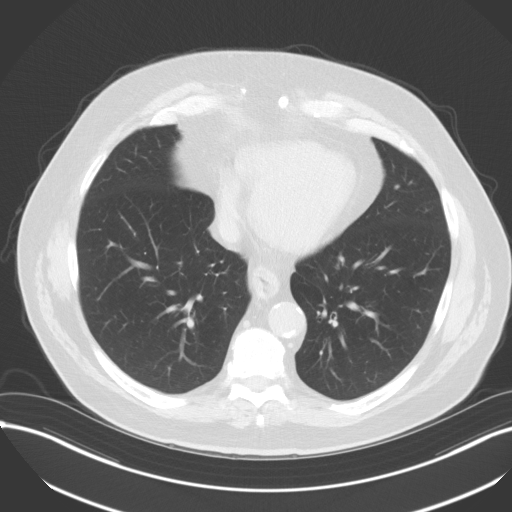
[im 28/62  lung]
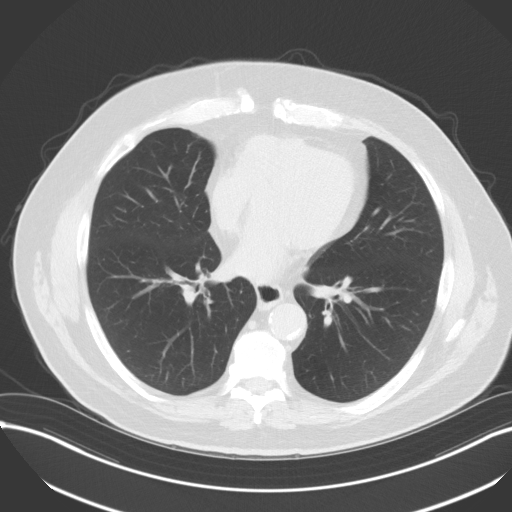
[im 34/62  lung]
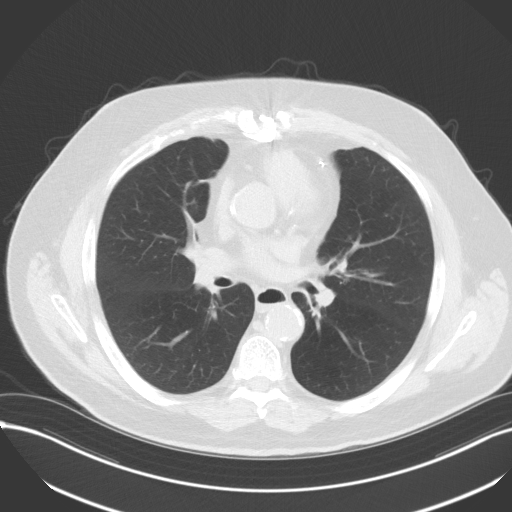
[im 39/62  lung]
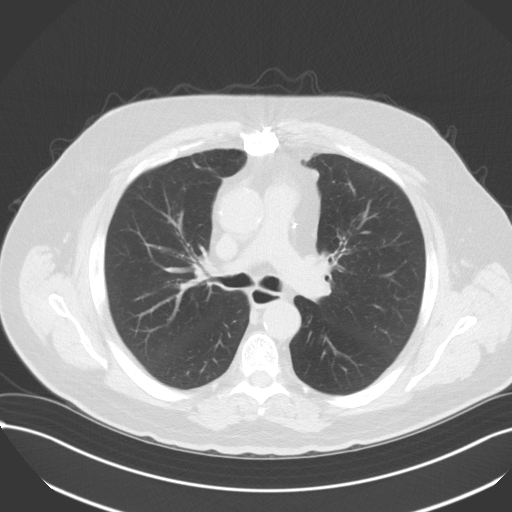
[im 43/62  mediastinal]
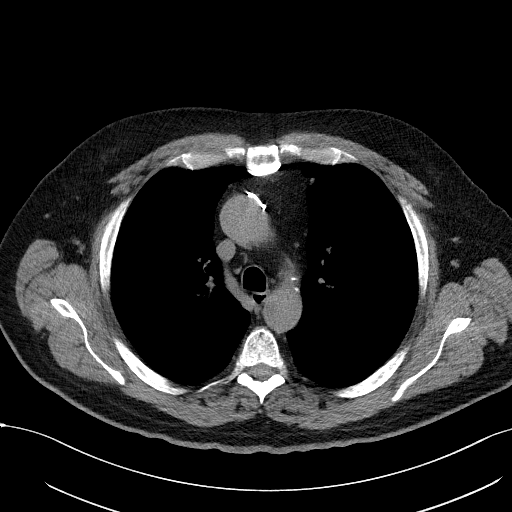
[im 43/62  lung]
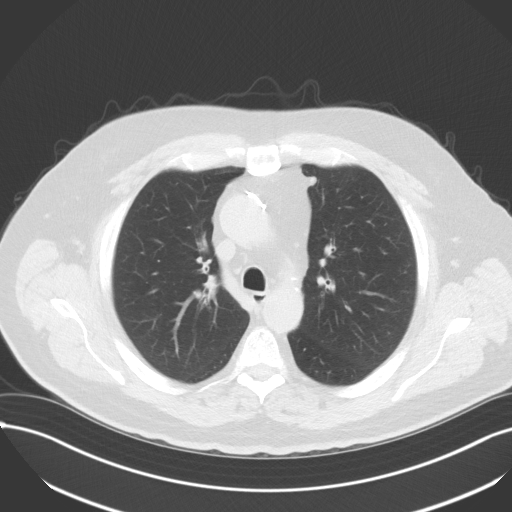
[im 48/62  lung]
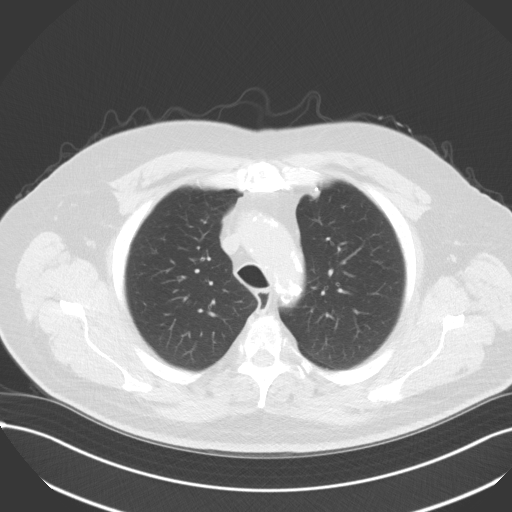
[im 52/62  lung]
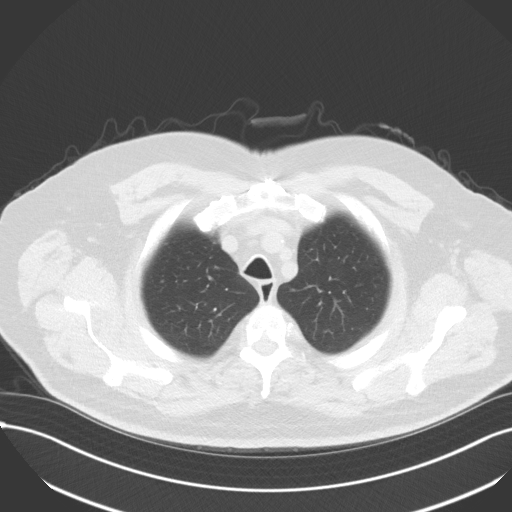
[im 57/62  lung]
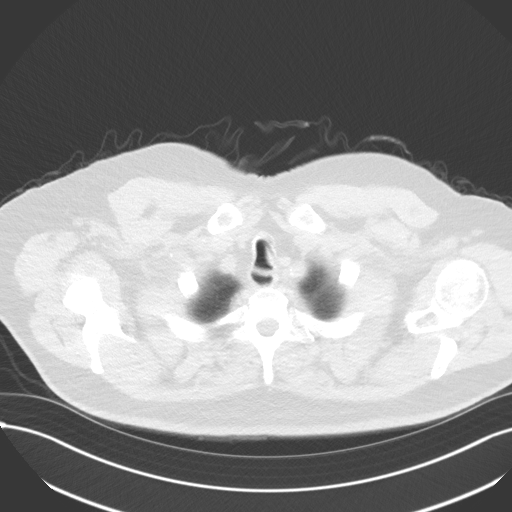

[Series 5: coronal · coronal · 0.62mm/px · 3 of 319 slices shown]
[im 64/319  lung]
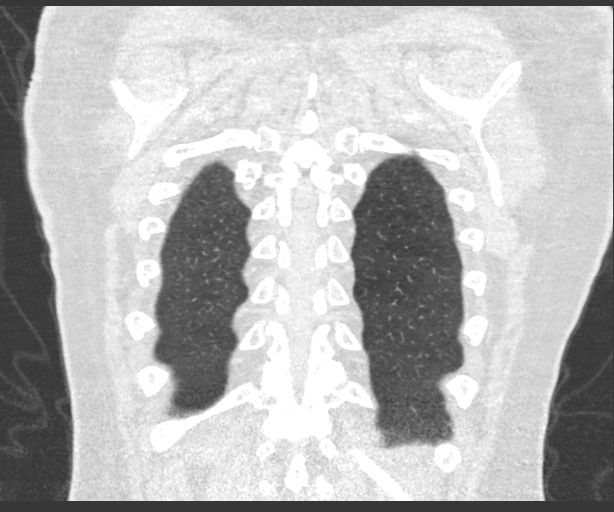
[im 128/319  lung]
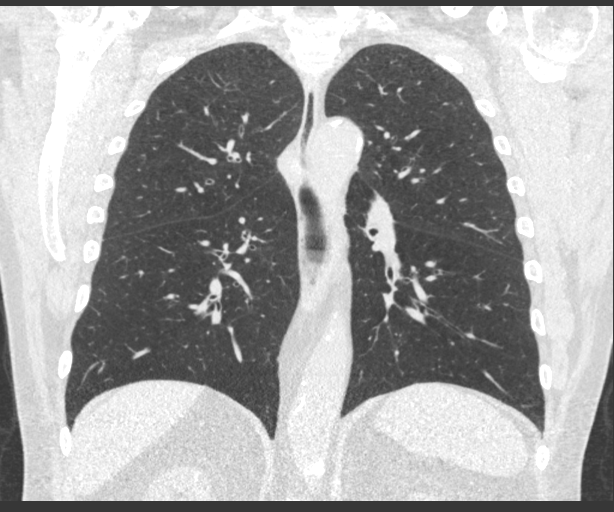
[im 191/319  lung]
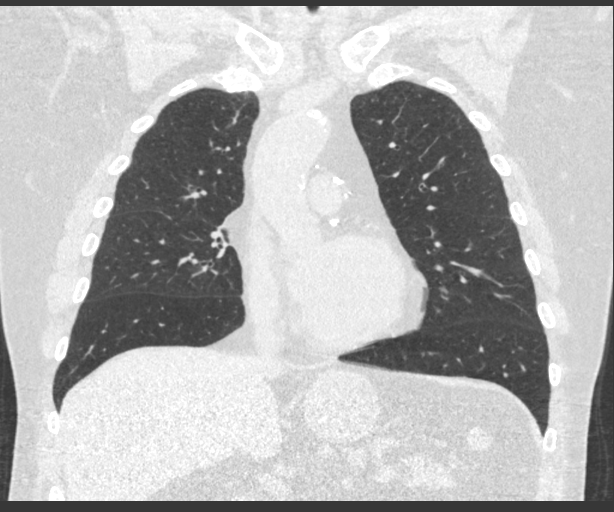

[15 of 36 positions shown; findings below may reference images not displayed]

FINDINGS: Cardiovascular: Atherosclerotic calcification of the aorta, aortic
valve and coronary arteries. Heart size normal. No pericardial
effusion.

Mediastinum/Nodes: No pathologically enlarged mediastinal or
axillary lymph nodes. Hilar regions are difficult to definitively
evaluate without IV contrast but appear grossly unremarkable.
Esophagus is somewhat dilated throughout its course, which can be
seen with dysmotility. Small hiatal hernia.

Lungs/Pleura: Centrilobular and paraseptal emphysema. Smoking
related respiratory bronchiolitis, peribronchial thickening and
scattered mucoid impaction. Pulmonary nodules measure 3.2 mm or less
in size. No pleural fluid. Airway is unremarkable.

Upper Abdomen: Liver appears somewhat heterogeneous. Visualized
portions of the liver, gallbladder, adrenal glands, kidneys, spleen,
pancreas, stomach and bowel are otherwise unremarkable with the
exception of a small hiatal hernia. No upper abdominal adenopathy.

Musculoskeletal: Degenerative changes in the spine. No worrisome
lytic or sclerotic lesions.
IMPRESSION: 1. Lung-RADS 2, benign appearance or behavior. Continue annual
screening with low-dose chest CT without contrast in 12 months.
2. Liver may be steatotic.
3. Aortic atherosclerosis ([id number]). Coronary artery
calcification.

## 2021-10-19 ENCOUNTER — Other Ambulatory Visit: Payer: Self-pay | Admitting: Family Medicine

## 2021-10-19 DIAGNOSIS — N529 Male erectile dysfunction, unspecified: Secondary | ICD-10-CM

## 2021-10-19 DIAGNOSIS — N401 Enlarged prostate with lower urinary tract symptoms: Secondary | ICD-10-CM

## 2021-10-19 DIAGNOSIS — R351 Nocturia: Secondary | ICD-10-CM

## 2021-11-23 ENCOUNTER — Other Ambulatory Visit: Payer: Self-pay | Admitting: Cardiology

## 2021-12-14 ENCOUNTER — Encounter: Payer: Medicare HMO | Admitting: Family Medicine

## 2021-12-21 ENCOUNTER — Other Ambulatory Visit: Payer: Self-pay | Admitting: Family Medicine

## 2021-12-21 DIAGNOSIS — N529 Male erectile dysfunction, unspecified: Secondary | ICD-10-CM

## 2021-12-21 DIAGNOSIS — N401 Enlarged prostate with lower urinary tract symptoms: Secondary | ICD-10-CM

## 2021-12-29 ENCOUNTER — Other Ambulatory Visit: Payer: Self-pay | Admitting: *Deleted

## 2021-12-29 DIAGNOSIS — I6523 Occlusion and stenosis of bilateral carotid arteries: Secondary | ICD-10-CM

## 2022-01-20 ENCOUNTER — Other Ambulatory Visit: Payer: Self-pay | Admitting: Family Medicine

## 2022-01-20 DIAGNOSIS — N529 Male erectile dysfunction, unspecified: Secondary | ICD-10-CM

## 2022-01-20 DIAGNOSIS — N401 Enlarged prostate with lower urinary tract symptoms: Secondary | ICD-10-CM

## 2022-01-22 ENCOUNTER — Ambulatory Visit (INDEPENDENT_AMBULATORY_CARE_PROVIDER_SITE_OTHER): Payer: Medicare HMO

## 2022-01-22 VITALS — Ht 67.0 in | Wt 205.0 lb

## 2022-01-22 DIAGNOSIS — Z0000 Encounter for general adult medical examination without abnormal findings: Secondary | ICD-10-CM | POA: Diagnosis not present

## 2022-01-22 NOTE — Patient Instructions (Signed)
Ethan Estrada , ?Thank you for taking time to come for your Medicare Wellness Visit. I appreciate your ongoing commitment to your health goals. Please review the following plan we discussed and let me know if I can assist you in the future.  ? ?Screening recommendations/referrals: ?Colonoscopy: Done 12/11/2011. No repeat required.  ? ?Recommended yearly ophthalmology/optometry visit for glaucoma screening and checkup ?Recommended yearly dental visit for hygiene and checkup ? ?Vaccinations: ?Influenza vaccine: Done 07/04/2019 Repeat annually ? ?Pneumococcal vaccine: Done 12/11/2019. ?Tdap vaccine: Due Repeat in 10 years ? ?Shingles vaccine: Discussed.   ?Covid-19: Done 01/29/2020 and 02/26/2020 ? ?Advanced directives: Please bring a copy of your health care power of attorney and living will to the office to be added to your chart at your convenience. ? ? ?Conditions/risks identified: Aim for 30 minutes of exercise or brisk walking, 6-8 glasses of water, and 5 servings of fruits and vegetables each day. ? ? ?Next appointment: Follow up in one year for your annual wellness visit. 2024. ? ?Preventive Care 78 Years and Older, Male ? ?Preventive care refers to lifestyle choices and visits with your health care provider that can promote health and wellness. ?What does preventive care include? ?A yearly physical exam. This is also called an annual well check. ?Dental exams once or twice a year. ?Routine eye exams. Ask your health care provider how often you should have your eyes checked. ?Personal lifestyle choices, including: ?Daily care of your teeth and gums. ?Regular physical activity. ?Eating a healthy diet. ?Avoiding tobacco and drug use. ?Limiting alcohol use. ?Practicing safe sex. ?Taking low doses of aspirin every day. ?Taking vitamin and mineral supplements as recommended by your health care provider. ?What happens during an annual well check? ?The services and screenings done by your health care provider during your  annual well check will depend on your age, overall health, lifestyle risk factors, and family history of disease. ?Counseling  ?Your health care provider may ask you questions about your: ?Alcohol use. ?Tobacco use. ?Drug use. ?Emotional well-being. ?Home and relationship well-being. ?Sexual activity. ?Eating habits. ?History of falls. ?Memory and ability to understand (cognition). ?Work and work Astronomer. ?Screening  ?You may have the following tests or measurements: ?Height, weight, and BMI. ?Blood pressure. ?Lipid and cholesterol levels. These may be checked every 5 years, or more frequently if you are over 49 years old. ?Skin check. ?Lung cancer screening. You may have this screening every year starting at age 78 if you have a 30-pack-year history of smoking and currently smoke or have quit within the past 15 years. ?Fecal occult blood test (FOBT) of the stool. You may have this test every year starting at age 20. ?Flexible sigmoidoscopy or colonoscopy. You may have a sigmoidoscopy every 5 years or a colonoscopy every 10 years starting at age 78. ?Prostate cancer screening. Recommendations will vary depending on your family history and other risks. ?Hepatitis C blood test. ?Hepatitis B blood test. ?Sexually transmitted disease (STD) testing. ?Diabetes screening. This is done by checking your blood sugar (glucose) after you have not eaten for a while (fasting). You may have this done every 1-3 years. ?Abdominal aortic aneurysm (AAA) screening. You may need this if you are a current or former smoker. ?Osteoporosis. You may be screened starting at age 31 if you are at high risk. ?Talk with your health care provider about your test results, treatment options, and if necessary, the need for more tests. ?Vaccines  ?Your health care provider may recommend certain vaccines, such as: ?  Influenza vaccine. This is recommended every year. ?Tetanus, diphtheria, and acellular pertussis (Tdap, Td) vaccine. You may need a Td  booster every 10 years. ?Zoster vaccine. You may need this after age 33. ?Pneumococcal 13-valent conjugate (PCV13) vaccine. One dose is recommended after age 55. ?Pneumococcal polysaccharide (PPSV23) vaccine. One dose is recommended after age 2. ?Talk to your health care provider about which screenings and vaccines you need and how often you need them. ?This information is not intended to replace advice given to you by your health care provider. Make sure you discuss any questions you have with your health care provider. ?Document Released: 10/03/2015 Document Revised: 05/26/2016 Document Reviewed: 07/08/2015 ?Elsevier Interactive Patient Education ? 2017 [place]. ? ?Fall Prevention in the Home ?Falls can cause injuries. They can happen to people of all ages. There are many things you can do to make your home safe and to help prevent falls. ?What can I do on the outside of my home? ?Regularly fix the edges of walkways and driveways and fix any cracks. ?Remove anything that might make you trip as you walk through a door, such as a raised step or threshold. ?Trim any bushes or trees on the path to your home. ?Use bright outdoor lighting. ?Clear any walking paths of anything that might make someone trip, such as rocks or tools. ?Regularly check to see if handrails are loose or broken. Make sure that both sides of any steps have handrails. ?Any raised decks and porches should have guardrails on the edges. ?Have any leaves, snow, or ice cleared regularly. ?Use sand or salt on walking paths during winter. ?Clean up any spills in your garage right away. This includes oil or grease spills. ?What can I do in the bathroom? ?Use night lights. ?Install grab bars by the toilet and in the tub and shower. Do not use towel bars as grab bars. ?Use non-skid mats or decals in the tub or shower. ?If you need to sit down in the shower, use a plastic, non-slip stool. ?Keep the floor dry. Clean up any water that spills on the floor  as soon as it happens. ?Remove soap buildup in the tub or shower regularly. ?Attach bath mats securely with double-sided non-slip rug tape. ?Do not have throw rugs and other things on the floor that can make you trip. ?What can I do in the bedroom? ?Use night lights. ?Make sure that you have a light by your bed that is easy to reach. ?Do not use any sheets or blankets that are too big for your bed. They should not hang down onto the floor. ?Have a firm chair that has side arms. You can use this for support while you get dressed. ?Do not have throw rugs and other things on the floor that can make you trip. ?What can I do in the kitchen? ?Clean up any spills right away. ?Avoid walking on wet floors. ?Keep items that you use a lot in easy-to-reach places. ?If you need to reach something above you, use a strong step stool that has a grab bar. ?Keep electrical cords out of the way. ?Do not use floor polish or wax that makes floors slippery. If you must use wax, use non-skid floor wax. ?Do not have throw rugs and other things on the floor that can make you trip. ?What can I do with my stairs? ?Do not leave any items on the stairs. ?Make sure that there are handrails on both sides of the stairs and use them.  Fix handrails that are broken or loose. Make sure that handrails are as long as the stairways. ?Check any carpeting to make sure that it is firmly attached to the stairs. Fix any carpet that is loose or worn. ?Avoid having throw rugs at the top or bottom of the stairs. If you do have throw rugs, attach them to the floor with carpet tape. ?Make sure that you have a light switch at the top of the stairs and the bottom of the stairs. If you do not have them, ask someone to add them for you. ?What else can I do to help prevent falls? ?Wear shoes that: ?Do not have high heels. ?Have rubber bottoms. ?Are comfortable and fit you well. ?Are closed at the toe. Do not wear sandals. ?If you use a stepladder: ?Make sure that it is  fully opened. Do not climb a closed stepladder. ?Make sure that both sides of the stepladder are locked into place. ?Ask someone to hold it for you, if possible. ?Clearly mark and make sure that you ca

## 2022-01-22 NOTE — Progress Notes (Signed)
?Virtual Visit via Telephone Note ? ?I connected with  Ethan Estrada on 01/22/22 at  8:45 AM EDT by telephone and verified that I am speaking with the correct person using two identifiers. ? ?Location: ?Patient: HOME ?Provider: [facility] ?Persons participating in the virtual visit: patient/Nurse Health Advisor ?  ?I discussed the limitations, risks, security and privacy concerns of performing an evaluation and management service by telephone and the availability of in person appointments. The patient expressed understanding and agreed to proceed. ? ?Interactive audio and video telecommunications were attempted between this nurse and patient, however failed, due to patient having technical difficulties OR patient did not have access to video capability.  We continued and completed visit with audio only. ? ?Some vital signs may be absent or patient reported.  ? ?Ethan Estrada Drinda Belgard, LPN ? ?Subjective:  ? Ethan Estrada is a 76 y.o. male who presents for Medicare Annual/Subsequent preventive examination. ? ?Review of Systems    ? ?Cardiac Risk Factors include: advanced age (>5855men, 25>65 women);hypertension;dyslipidemia;male gender;smoking/ tobacco exposure;sedentary lifestyle;obesity (BMI >30kg/m2) ? ?   ?Objective:  ?  ?Today's Vitals  ? 01/22/22 0851  ?Weight: 205 lb (93 kg)  ?Height: 5\' 7"  (1.702 m)  ? ?Body mass index is 32.11 kg/m?. ? ? ?  01/22/2022  ?  8:56 AM 02/12/2021  ? 10:53 AM 12/18/2020  ?  2:54 PM 12/07/2019  ?  2:37 PM 08/28/2019  ?  3:05 PM 10/13/2018  ?  6:00 AM 10/12/2018  ?  6:32 AM  ?Advanced Directives  ?Does Patient Have a Medical Advance Directive? Yes Yes Yes Yes Yes Yes Yes  ?Type of Estate agentAdvance Directive Healthcare Power of ThendaraAttorney;Living will Healthcare Power of LandingAttorney;Living will Healthcare Power of [place] HillAttorney;Living will Healthcare Power of [place]Attorney;Living will Healthcare Power of [place]Attorney;Living will Healthcare Power of GarretsonAttorney;Living will Healthcare Power of HardingAttorney;Living will  ?Does patient want  to make changes to medical advance directive?  No - Guardian declined  No - Patient declined No - Patient declined No - Patient declined   ?Copy of Healthcare Power of Attorney in Chart? No - copy requested No - copy requested No - copy requested No - copy requested   No - copy requested  ? ? ?Current Medications (verified) ?Outpatient Encounter Medications as of 01/22/2022  ?Medication Sig  ? aspirin 81 MG tablet Take 1 tablet (81 mg total) by mouth daily.  ? atorvastatin (LIPITOR) 80 MG tablet PATIENT NEED TO SCHEDULE APPOINTMENT FOR FUTURE REFILLS.TAKE 1 TABLET DAILY AT 6PM.  ? metoprolol tartrate (LOPRESSOR) 50 MG tablet TAKE 1 TABLET TWICE DAILY  ? omeprazole (PRILOSEC) 10 MG capsule Take 10 mg by mouth daily.   ? oxyCODONE (OXY IR/ROXICODONE) 5 MG immediate release tablet Take 1-2 tablets (5-10 mg total) by mouth every 6 (six) hours as needed for severe pain.  ? tadalafil (CIALIS) 5 MG tablet Take 1 tablet by mouth once daily  ? ?No facility-administered encounter medications on file as of 01/22/2022.  ? ? ?Allergies (verified) ?Patient has no known allergies.  ? ?History: ?Past Medical History:  ?Diagnosis Date  ? Acute myocardial infarction, unspecified site, episode of care unspecified 1985  ? CAD (coronary artery disease)   ? Cerebrovascular disease, unspecified   ? Coronary atherosclerosis of unspecified type of vessel, native or graft   ? GERD (gastroesophageal reflux disease)   ? Hemorrhoids   ? History of heart bypass surgery   ? MI (myocardial infarction) (HCC) 2010  ? Other and unspecified hyperlipidemia   ?  Renal hematoma 2005  ? Wears dentures   ? full set  ? Wears glasses   ? ?Past Surgical History:  ?Procedure Laterality Date  ? COLONOSCOPY WITH ESOPHAGOGASTRODUODENOSCOPY (EGD)  yrs ago due 2022  ? CORONARY ARTERY BYPASS GRAFT  x 3  ? S/P in February of 2010. Procedure performed in [place]  ? ENDARTERECTOMY Left 10/12/2018  ? Procedure: ENDARTERECTOMY CAROTID LEFT;  Surgeon: Larina Earthly, MD;   [facility];  Service: Vascular;  Laterality: Left;  ? EYE SURGERY Left   ? cataracts  ? HEMORRHOID SURGERY    ? HEMORRHOID SURGERY N/A 02/12/2021  ? Procedure: HEMORRHOIDECTOMY SINGLE COLUMN;  Surgeon: Romie Levee, MD;  Location: [facility];  Service: General;  Laterality: N/A;  ? TONSILLECTOMY  as child  ? ?Family History  ?Problem Relation Age of Onset  ? Cancer Mother 35  ?     Leukemia  ? Coronary artery disease Father 62  ?     MI  ? ?Social History  ? ?Socioeconomic History  ? Marital status: Married  ?  Spouse name: Not on file  ? Number of children: Not on file  ? Years of education: Not on file  ? Highest education level: Not on file  ?Occupational History  ? Occupation: Full Time  ?Tobacco Use  ? Smoking status: Every Day  ?  Packs/day: 2.00  ?  Years: 50.00  ?  Pack years: 100.00  ?  Types: Cigars, Cigarettes  ?  Last attempt to quit: 10/21/2008  ?  Years since quitting: 13.2  ? Smokeless tobacco: Never  ? Tobacco comments:  ?  Now smoking cigars 2 cigares per day  ?Vaping Use  ? Vaping Use: Never used  ?Substance and Sexual Activity  ? Alcohol use: Yes  ?  Comment: occasionally  ? Drug use: No  ? Sexual activity: Not on file  ?Other Topics Concern  ? Not on file  ?Social History Narrative  ? No Regular Exercise  ? ?Social Determinants of Health  ? ?Financial Resource Strain: Low Risk   ? Difficulty of Paying Living Expenses: Not hard at all  ?Food Insecurity: No Food Insecurity  ? Worried About Programme researcher, broadcasting/film/video in the Last Year: Never true  ? Ran Out of Food in the Last Year: Never true  ?Transportation Needs: No Transportation Needs  ? Lack of Transportation (Medical): No  ? Lack of Transportation (Non-Medical): No  ?Physical Activity: Insufficiently Active  ? Days of Exercise per Week: 3 days  ? Minutes of Exercise per Session: 20 min  ?Stress: No Stress Concern Present  ? Feeling of Stress : Not at all  ?Social Connections: Moderately Integrated  ? Frequency of Communication  with Friends and Family: More than three times a week  ? Frequency of Social Gatherings with Friends and Family: More than three times a week  ? Attends Religious Services: 1 to 4 times per year  ? Active Member of Clubs or Organizations: No  ? Attends Banker Meetings: Never  ? Marital Status: Married  ? ? ?Tobacco Counseling ?Ready to quit: Not Answered ?Counseling given: Not Answered ?Tobacco comments: Now smoking cigars 2 cigares per day ? ? ?Clinical Intake: ? ?Pre-visit preparation completed: Yes ? ?Pain : No/denies pain ? ?  ? ?BMI - recorded: 32.11 ?Nutritional Status: BMI > 30  Obese ?Nutritional Risks: None ?Diabetes: No ? ?How often do you need to have someone help you when you read  instructions, pamphlets, or other written materials from your doctor or pharmacy?: 1 - Never ? ?Diabetic?NO ? ?Interpreter Needed?: No ? ?Information entered by :: mj Sherline Eberwein, lpn ? ? ?Activities of Daily Living ? ?  01/22/2022  ?  8:57 AM 02/12/2021  ? 10:57 AM  ?In your present state of health, do you have any difficulty performing the following activities:  ?Hearing? 0 0  ?Vision? 0 0  ?Difficulty concentrating or making decisions? 0 0  ?Walking or climbing stairs? 0 0  ?Dressing or bathing? 0 0  ?Doing errands, shopping? 0   ?Preparing Food and eating ? N   ?Using the Toilet? N   ?In the past six months, have you accidently leaked urine? N   ?Do you have problems with loss of bowel control? N   ?Managing your Medications? N   ?Managing your Finances? N   ?Housekeeping or managing your Housekeeping? N   ? ? ?Patient Care Team: ?Sharlene Dory, DO as PCP - General (Family Medicine) ?Lewayne Bunting, MD as PCP - Cardiology (Cardiology) ? ?Indicate any recent Medical Services you may have received from other than [facility] providers in the past year (date may be approximate). ? ?   ?Assessment:  ? This is a routine wellness examination for [place]. ? ?Hearing/Vision screen ?Hearing Screening - Comments:: No  hearing issues.  ?Vision Screening - Comments:: Glasses. Dr. Logan Bores. 2022. ? ?Dietary issues and exercise activities discussed: ?Current Exercise Habits: The patient does not participate in regular exercise at

## 2022-02-18 ENCOUNTER — Other Ambulatory Visit: Payer: Self-pay | Admitting: Family Medicine

## 2022-02-18 DIAGNOSIS — N401 Enlarged prostate with lower urinary tract symptoms: Secondary | ICD-10-CM

## 2022-02-18 DIAGNOSIS — N529 Male erectile dysfunction, unspecified: Secondary | ICD-10-CM

## 2022-03-15 ENCOUNTER — Telehealth: Payer: Self-pay | Admitting: Cardiology

## 2022-03-15 NOTE — Telephone Encounter (Signed)
Pt c/o swelling: STAT is pt has developed SOB within 24 hours  If swelling, where is the swelling located? Both Feet and ankles  How much weight have you gained and in what time span? Not sure   Have you gained 3 pounds in a day or 5 pounds in a week? No, but not sure  Do you have a log of your daily weights (if so, list)? No   Are you currently taking a fluid pill? No   Are you currently SOB? No   Have you traveled recently? No

## 2022-03-15 NOTE — Telephone Encounter (Signed)
Spoke to patient's wife.She stated husband has been having swelling in both feet and ankles for the past 1 month.No sob.No chest pain. Stated he was given appointment with Dr.Crenshaw in [facility] for Sept.Stated he needs to be seen before Sept.He only wants to see Dr.Crenshaw.Advised I will send message to Dr.Crenshaw's RN.

## 2022-03-25 ENCOUNTER — Telehealth: Payer: Self-pay | Admitting: Family Medicine

## 2022-03-25 DIAGNOSIS — N401 Enlarged prostate with lower urinary tract symptoms: Secondary | ICD-10-CM

## 2022-03-25 DIAGNOSIS — N529 Male erectile dysfunction, unspecified: Secondary | ICD-10-CM

## 2022-03-25 NOTE — Telephone Encounter (Signed)
Spoke with spouse and made aware patient needed an appt since he was last seen in 2022 , appt made

## 2022-03-25 NOTE — Telephone Encounter (Signed)
[name] (spouse) called asking why the Tadalafil was denied for refill. She was advised that no information was available at the time and a note would be sent back to look into what was going on with that medication. She acknowledged understanding.

## 2022-03-31 ENCOUNTER — Encounter: Payer: Self-pay | Admitting: Family Medicine

## 2022-03-31 ENCOUNTER — Ambulatory Visit (INDEPENDENT_AMBULATORY_CARE_PROVIDER_SITE_OTHER): Payer: Medicare HMO | Admitting: Family Medicine

## 2022-03-31 VITALS — BP 138/70 | HR 68 | Temp 98.0°F | Resp 16 | Ht 67.0 in | Wt 207.0 lb

## 2022-03-31 DIAGNOSIS — Z0000 Encounter for general adult medical examination without abnormal findings: Secondary | ICD-10-CM | POA: Diagnosis not present

## 2022-03-31 DIAGNOSIS — I2510 Atherosclerotic heart disease of native coronary artery without angina pectoris: Secondary | ICD-10-CM

## 2022-03-31 DIAGNOSIS — Z23 Encounter for immunization: Secondary | ICD-10-CM | POA: Diagnosis not present

## 2022-03-31 DIAGNOSIS — N401 Enlarged prostate with lower urinary tract symptoms: Secondary | ICD-10-CM

## 2022-03-31 DIAGNOSIS — I700 Atherosclerosis of aorta: Secondary | ICD-10-CM | POA: Diagnosis not present

## 2022-03-31 DIAGNOSIS — G4700 Insomnia, unspecified: Secondary | ICD-10-CM

## 2022-03-31 DIAGNOSIS — N529 Male erectile dysfunction, unspecified: Secondary | ICD-10-CM

## 2022-03-31 DIAGNOSIS — R351 Nocturia: Secondary | ICD-10-CM | POA: Diagnosis not present

## 2022-03-31 MED ORDER — TADALAFIL 5 MG PO TABS: 5.0000 mg | ORAL_TABLET | Freq: Every day | ORAL | 2 refills | Status: DC

## 2022-03-31 MED ORDER — ZOLPIDEM TARTRATE 10 MG PO TABS: 5.0000 mg | ORAL_TABLET | Freq: Every evening | ORAL | 2 refills | Status: DC | PRN

## 2022-03-31 MED ORDER — ZOLPIDEM TARTRATE 10 MG PO TABS: 5.0000 mg | ORAL_TABLET | Freq: Every evening | ORAL | 1 refills | Status: DC | PRN

## 2022-03-31 NOTE — Patient Instructions (Signed)
Give us 2-3 business days to get the results of your labs back.   Keep the diet clean and stay active.  Please get me a copy of your advanced directive form at your convenience.   Let us know if you need anything.  

## 2022-03-31 NOTE — Progress Notes (Signed)
Chief Complaint  Patient presents with   Annual Exam    Well Estrada Ethan Estrada is here for a complete physical.   His last physical was >1 year ago.  Current diet: in general, a "healthy" diet.   Current exercise: yard work Weight trend: stable Fatigue out of ordinary? No. Seat belt? Yes.   Advanced directive? Yes  Health maintenance Shingrix- No Tetanus- Due Hep C- Yes Pneumonia vaccine- Due  Insomnia Patient has a several decade history of insomnia.  He is not sleeping well and it is affecting his work.  He was on Ambien many years ago and it worked well.  His cardiologist used to prescribe it but told him he could no longer do it so has not been taking anything.  He did try trazodone with me which made him feel lightheaded the next day.  He is not currently following with a therapist.  Denies anxiety.  Past Medical History:  Diagnosis Date   Acute myocardial infarction, unspecified site, episode of care unspecified 1985   CAD (coronary artery disease)    Cerebrovascular disease, unspecified    Coronary atherosclerosis of unspecified type of vessel, native or graft    GERD (gastroesophageal reflux disease)    Hemorrhoids    History of heart bypass surgery    MI (myocardial infarction) (HCC) 2010   Other and unspecified hyperlipidemia    Renal hematoma 2005   Wears dentures    full set   Wears glasses      Past Surgical History:  Procedure Laterality Date   COLONOSCOPY WITH ESOPHAGOGASTRODUODENOSCOPY (EGD)  yrs ago due 2022   CORONARY ARTERY BYPASS GRAFT  x 3   S/P in February of 2010. Procedure performed in [place]   ENDARTERECTOMY Left 10/12/2018   Procedure: ENDARTERECTOMY CAROTID LEFT;  Surgeon: Larina Earthly, MD;  Location[facility] OR;  Service: Vascular;  Laterality: Left;   EYE SURGERY Left    cataracts   HEMORRHOID SURGERY     HEMORRHOID SURGERY N/A 02/12/2021   Procedure: HEMORRHOIDECTOMY SINGLE COLUMN;  Surgeon: Romie Levee, MD;  Location[facility];  Service: General;  Laterality: N/A;   TONSILLECTOMY  as child    Medications  Current Outpatient Medications on File Prior to Visit  Medication Sig Dispense Refill   aspirin 81 MG tablet Take 1 tablet (81 mg total) by mouth daily.     atorvastatin (LIPITOR) 80 MG tablet PATIENT NEED TO SCHEDULE APPOINTMENT FOR FUTURE REFILLS.TAKE 1 TABLET DAILY AT 6PM. 90 tablet 0   metoprolol tartrate (LOPRESSOR) 50 MG tablet TAKE 1 TABLET TWICE DAILY 180 tablet 0   omeprazole (PRILOSEC) 10 MG capsule Take 10 mg by mouth daily.       Allergies No Known Allergies  Family History Family History  Problem Relation Age of Onset   Cancer Mother 10       Leukemia   Coronary artery disease Father 61       MI    Review of Systems: Constitutional:  no fevers Eye:  no recent significant change in vision Ears:  No changes in hearing Nose/Mouth/Throat:  no complaints of nasal congestion, no sore throat Cardiovascular: no chest pain Respiratory:  No shortness of breath Gastrointestinal:  No change in bowel habits GU:  No frequency Integumentary:  no abnormal skin lesions reported Neurologic:  no headaches Endocrine:  denies unexplained weight changes  Exam BP 138/Ethan (BP Location: Left Arm, Cuff Size: Normal)   Pulse 68   Temp  98 F (36.7 C) (Oral)   Resp 16   Ht 5\' 7"  (1.702 m)   Wt 207 lb (93.9 kg)   SpO2 100%   BMI 32.42 kg/m  General:  well developed, well nourished, in no apparent distress Skin:  no significant moles, warts, or growths Head:  no masses, lesions, or tenderness Eyes:  pupils equal and round, sclera anicteric without injection Ears:  canals without lesions, TMs shiny without retraction, no obvious effusion, no erythema Nose:  nares patent, septum midline, mucosa normal Throat/Pharynx:  lips and gingiva without lesion; tongue and uvula midline; non-inflamed pharynx; no exudates or postnasal drainage Lungs:  clear to auscultation, breath sounds equal  bilaterally, no respiratory distress Cardio:  regular rate and rhythm, no LE edema or bruits Rectal: Deferred GI: BS+, S, NT, ND, no masses or organomegaly Musculoskeletal:  symmetrical muscle groups noted without atrophy or deformity Neuro:  gait normal; deep tendon reflexes normal and symmetric Psych: well oriented with normal range of affect and appropriate judgment/insight  Assessment and Plan  Well adult exam  Aortic atherosclerosis (HCC) - Plan: Comprehensive metabolic panel, Lipid panel  Atherosclerosis of native coronary artery of native heart without angina pectoris  Benign prostatic hyperplasia with nocturia - Plan: tadalafil (CIALIS) 5 MG tablet  Erectile dysfunction, unspecified erectile dysfunction type - Plan: tadalafil (CIALIS) 5 MG tablet  Insomnia, unspecified type - Plan: zolpidem (AMBIEN) 10 MG tablet  Need for vaccination against Streptococcus pneumoniae - Plan: Pneumococcal conjugate vaccine 20-valent (Prevnar 20)   Well Ethan Estrada. Counseled on diet and exercise. Advanced directive form requested today.  Shingrix and Tdap rec'd. Would rec going to pharmacy.  PCV20 today.  Insomnia: Chronic, not controlled.  Restart Ambien 5-10 mg nightly.  If he does well, I am okay keeping him on it chronically as he was on it for so long and we will improve his quality of life.  We will complete a controlled substance contract and UDS at next visit. Other orders as above. Follow up in 6 months. The patient voiced understanding and agreement to the plan.  Ethan [place], DO 03/31/22 4:44 PM

## 2022-04-01 LAB — COMPREHENSIVE METABOLIC PANEL
ALT: 14 U/L (ref 0–53)
AST: 21 U/L (ref 0–37)
Albumin: 4.3 g/dL (ref 3.5–5.2)
Alkaline Phosphatase: 79 U/L (ref 39–117)
BUN: 12 mg/dL (ref 6–23)
CO2: 27 mEq/L (ref 19–32)
Calcium: 9.3 mg/dL (ref 8.4–10.5)
Chloride: 105 mEq/L (ref 96–112)
Creatinine, Ser: 1.39 mg/dL (ref 0.40–1.50)
GFR: 48.64 mL/min — ABNORMAL LOW (ref >60.00)
Glucose, Bld: 101 mg/dL — ABNORMAL HIGH (ref 70–99)
Potassium: 5.1 mEq/L (ref 3.5–5.1)
Sodium: 140 mEq/L (ref 135–145)
Total Bilirubin: 0.9 mg/dL (ref 0.2–1.2)
Total Protein: 6.9 g/dL (ref 6.0–8.3)

## 2022-04-01 LAB — LIPID PANEL
Cholesterol: 133 mg/dL (ref 0–200)
HDL: 37.6 mg/dL — ABNORMAL LOW (ref >39.00)
LDL Cholesterol: 73 mg/dL (ref 0–99)
NonHDL: 95.8
Total CHOL/HDL Ratio: 4
Triglycerides: 112 mg/dL (ref 0.0–149.0)
VLDL: 22.4 mg/dL (ref 0.0–40.0)

## 2022-04-26 ENCOUNTER — Other Ambulatory Visit: Payer: Self-pay | Admitting: Cardiology

## 2022-04-27 ENCOUNTER — Telehealth: Payer: Self-pay | Admitting: Family Medicine

## 2022-04-27 DIAGNOSIS — G4700 Insomnia, unspecified: Secondary | ICD-10-CM

## 2022-04-27 MED ORDER — ZOLPIDEM TARTRATE 10 MG PO TABS: 5.0000 mg | ORAL_TABLET | Freq: Every evening | ORAL | 1 refills | Status: DC | PRN

## 2022-04-27 NOTE — Telephone Encounter (Signed)
Medication: zolpidem (AMBIEN) 10 MG tablet [[id number]] --- requested 90 tabs again.  Has the patient contacted their pharmacy? Yes.   New prescription needs to be faxed in # [phone]   Preferred Pharmacy (with phone number or street name): [facility] - [place], [place] - [address]  [address] [place] [address]  Phone:  [phone]  Fax:  [phone]      Agent: Please be advised that RX refills may take up to 3 business days. We ask that you follow-up with your pharmacy.

## 2022-04-27 NOTE — Telephone Encounter (Signed)
Last Refill was on 03/31/2022 #90 with 1 refill, Problem is that the first fill was done at [facility] and they need this fill to be done at mail order. So please send in #90 to [facility].

## 2022-04-29 ENCOUNTER — Telehealth: Payer: Self-pay | Admitting: Family Medicine

## 2022-04-29 NOTE — Telephone Encounter (Signed)
Pam, Pharmacy Tech with [facility], called to follow up on the fax that was sent on 8/5 requesting clarification on dosing and directions as the Zolpidem, 10 mg exceeds recommended dose for geriatric patients which is 5 mg. Please call [phone] or return fax to [phone]

## 2022-05-27 NOTE — Telephone Encounter (Signed)
error 

## 2022-06-02 NOTE — Progress Notes (Signed)
HPI: FU coronary disease. The patient underwent coronary artery bypass and graft in [place] in February of 2010 (LIMA to the LAD, saphenous vein graft to the circumflex, and free RIMA to the ramus). Note an echocardiogram in February of 2010 showed an ejection fraction of 50-55%. Abdominal ultrasound August 2018 showed no aneurysm.  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  Nuclear study August 2018 showed ejection fraction 50%.  There was prior infarct but no ischemia. Had left carotid endarterectomy in January 2020.    Carotid Dopplers 5/22 showed 1 to 39% right and 40 to 59% left stenosis.  Since he was last seen, the patient has dyspnea with more extreme activities but not with routine activities. It is relieved with rest. It is not associated with chest pain. There is no orthopnea, PND or pedal edema. There is no syncope or palpitations. There is no exertional chest pain.   Current Outpatient Medications  Medication Sig Dispense Refill   aspirin 81 MG tablet Take 1 tablet (81 mg total) by mouth daily.     atorvastatin (LIPITOR) 80 MG tablet TAKE 1 TABLET EVERY DAY  AT  6PM (NEED MD APPOINTMENT FOR REFILLS) 90 tablet 0   metoprolol tartrate (LOPRESSOR) 50 MG tablet TAKE 1 TABLET TWICE DAILY 180 tablet 0   omeprazole (PRILOSEC) 10 MG capsule Take 10 mg by mouth daily.      tadalafil (CIALIS) 5 MG tablet Take 1 tablet (5 mg total) by mouth daily. 90 tablet 2   zolpidem (AMBIEN) 10 MG tablet Take 0.5-1 tablets (5-10 mg total) by mouth at bedtime as needed for sleep. 90 tablet 1   No current facility-administered medications for this visit.     Past Medical History:  Diagnosis Date   Acute myocardial infarction, unspecified site, episode of care unspecified 1985   CAD (coronary artery disease)    Cerebrovascular disease, unspecified    Coronary atherosclerosis of unspecified type of vessel, native or graft    GERD (gastroesophageal reflux disease)    Hemorrhoids    History  of heart bypass surgery    MI (myocardial infarction) (HCC) 2010   Other and unspecified hyperlipidemia    Renal hematoma 2005   Wears dentures    full set   Wears glasses     Past Surgical History:  Procedure Laterality Date   COLONOSCOPY WITH ESOPHAGOGASTRODUODENOSCOPY (EGD)  yrs ago due 2022   CORONARY ARTERY BYPASS GRAFT  x 3   S/P in February of 2010. Procedure performed in [facility]   ENDARTERECTOMY Left 10/12/2018   Procedure: ENDARTERECTOMY CAROTID LEFT;  Surgeon: Larina Earthly, MD;  Location: [facility] OR;  Service: Vascular;  Laterality: Left;   EYE SURGERY Left    cataracts   HEMORRHOID SURGERY     HEMORRHOID SURGERY N/A 02/12/2021   Procedure: HEMORRHOIDECTOMY SINGLE COLUMN;  Surgeon: Romie Levee, MD;  Location: [facility];  Service: General;  Laterality: N/A;   TONSILLECTOMY  as child    Social History   Socioeconomic History   Marital status: Married    Spouse name: Not on file   Number of children: Not on file   Years of education: Not on file   Highest education level: Not on file  Occupational History   Occupation: Full Time  Tobacco Use   Smoking status: Every Day    Packs/day: 2.00    Years: 50.00    Total pack years: 100.00    Types: Cigars, Cigarettes  Last attempt to quit: 10/21/2008    Years since quitting: 13.6   Smokeless tobacco: Never   Tobacco comments:    Now smoking cigars 2 cigares per day  Vaping Use   Vaping Use: Never used  Substance and Sexual Activity   Alcohol use: Yes    Comment: occasionally   Drug use: No   Sexual activity: Not on file  Other Topics Concern   Not on file  Social History Narrative   No Regular Exercise   Social Determinants of Health   Financial Resource Strain: Low Risk  (01/22/2022)   Overall Financial Resource Strain (CARDIA)    Difficulty of Paying Living Expenses: Not hard at all  Food Insecurity: No Food Insecurity (01/22/2022)   Hunger Vital Sign    Worried About Running Out of Food  in the Last Year: Never true    Ran Out of Food in the Last Year: Never true  Transportation Needs: No Transportation Needs (01/22/2022)   PRAPARE - Administrator, Civil Service (Medical): No    Lack of Transportation (Non-Medical): No  Physical Activity: Insufficiently Active (01/22/2022)   Exercise Vital Sign    Days of Exercise per Week: 3 days    Minutes of Exercise per Session: 20 min  Stress: No Stress Concern Present (01/22/2022)   [employer] of Occupational Health - Occupational Stress Questionnaire    Feeling of Stress : Not at all  Social Connections: Moderately Integrated (01/22/2022)   Social Connection and Isolation Panel [NHANES]    Frequency of Communication with Friends and Family: More than three times a week    Frequency of Social Gatherings with Friends and Family: More than three times a week    Attends Religious Services: 1 to 4 times per year    Active Member of [employer] or Organizations: No    Attends Banker Meetings: Never    Marital Status: Married  Catering manager Violence: Not At Risk (01/22/2022)   Humiliation, Afraid, Rape, and Kick questionnaire    Fear of Current or Ex-Partner: No    Emotionally Abused: No    Physically Abused: No    Sexually Abused: No    Family History  Problem Relation Age of Onset   Cancer Mother 69       Leukemia   Coronary artery disease Father 62       MI    ROS: no fevers or chills, productive cough, hemoptysis, dysphasia, odynophagia, melena, hematochezia, dysuria, hematuria, rash, seizure activity, orthopnea, PND, pedal edema, claudication. Remaining systems are negative.  Physical Exam: Well-developed well-nourished in no acute distress.  Skin is warm and dry.  HEENT is normal.  Neck is supple.  Chest is clear to auscultation with normal expansion.  Cardiovascular exam is regular rate and rhythm.  Abdominal exam nontender or distended. No masses palpated. Extremities show no edema. neuro  grossly intact  ECG-normal sinus rhythm with first-degree AV block, inferior T wave inversion.  Personally reviewed  A/P  1 coronary artery disease-patient doing well from a symptomatic standpoint with no chest pain.  Continue medical therapy with aspirin and statin.  2 hyperlipidemia-continue statin.  Recent LDL not at goal.  Add Zetia 10 mg daily.  Check lipids and liver in 8 weeks.  3 carotid artery disease-we will arrange follow-up carotid Dopplers.  4 tobacco abuse-patient counseled on discontinuing.  Olga Millers, MD

## 2022-06-09 ENCOUNTER — Ambulatory Visit: Payer: Medicare HMO | Attending: Cardiology | Admitting: Cardiology

## 2022-06-09 ENCOUNTER — Encounter: Payer: Self-pay | Admitting: Cardiology

## 2022-06-09 VITALS — BP 146/82 | HR 69 | Ht 67.0 in | Wt 210.8 lb

## 2022-06-09 DIAGNOSIS — I6523 Occlusion and stenosis of bilateral carotid arteries: Secondary | ICD-10-CM | POA: Diagnosis not present

## 2022-06-09 DIAGNOSIS — E7800 Pure hypercholesterolemia, unspecified: Secondary | ICD-10-CM | POA: Diagnosis not present

## 2022-06-09 DIAGNOSIS — I2510 Atherosclerotic heart disease of native coronary artery without angina pectoris: Secondary | ICD-10-CM | POA: Diagnosis not present

## 2022-06-09 MED ORDER — EZETIMIBE 10 MG PO TABS: 10.0000 mg | ORAL_TABLET | Freq: Every day | ORAL | 3 refills | Status: DC

## 2022-06-09 NOTE — Patient Instructions (Signed)
Medication Instructions:   START EZETIMIBE 10 MG ONCE DAILY  *If you need a refill on your cardiac medications before your next appointment, please call your pharmacy*   Lab Work:  Your physician recommends that you return for lab work in: [place] on the 2 nd floor in [address] Hours- Mon-Thur 8 am-11:30 am and 1 pm - 4:30 pm             Fri-8am - 11 am  If you have labs (blood work) drawn today and your tests are completely normal, you will receive your results only by: [place] (if you have MyChart) OR A paper copy in the mail If you have any lab test that is abnormal or we need to change your treatment, we will call you to review the results.   Testing/Procedures:  Your physician has requested that you have a carotid duplex. This test is an ultrasound of the carotid arteries in your neck. It looks at blood flow through these arteries that supply the brain with blood. Allow one hour for this exam. There are no restrictions or special instructions. 1ST FLOOR IMAGING DEPARTMENT [place] [facility]   Follow-Up: At [facility], you and your health needs are our priority.  As part of our continuing mission to provide you with exceptional heart care, we have created designated Provider Care Teams.  These Care Teams include your primary Cardiologist (physician) and Advanced Practice Providers (APPs -  Physician Assistants and Nurse Practitioners) who all work together to provide you with the care you need, when you need it.  We recommend signing up for the patient portal called "MyChart".  Sign up information is provided on this After Visit Summary.  MyChart is used to connect with patients for Virtual Visits (Telemedicine).  Patients are able to view lab/test results, encounter notes, upcoming appointments, etc.  Non-urgent messages can be sent to your provider as well.   To learn more about what you can do with MyChart, go to  [email or web address].    Your next appointment:   12 month(s)  The format for your next appointment:   In Person  Provider:   Kirk Ruths, MD

## 2022-06-10 ENCOUNTER — Ambulatory Visit (HOSPITAL_BASED_OUTPATIENT_CLINIC_OR_DEPARTMENT_OTHER)
Admission: RE | Admit: 2022-06-10 | Discharge: 2022-06-10 | Disposition: A | Discharge status: Home / Self Care | Payer: Medicare HMO | Source: Ambulatory Visit | Attending: Cardiology | Admitting: Cardiology

## 2022-06-10 DIAGNOSIS — I6523 Occlusion and stenosis of bilateral carotid arteries: Secondary | ICD-10-CM | POA: Insufficient documentation

## 2022-07-12 ENCOUNTER — Other Ambulatory Visit: Payer: Self-pay | Admitting: Cardiology

## 2022-08-31 ENCOUNTER — Encounter: Payer: Self-pay | Admitting: *Deleted

## 2022-09-28 ENCOUNTER — Other Ambulatory Visit: Payer: Self-pay | Admitting: Cardiology

## 2022-10-01 ENCOUNTER — Encounter: Payer: Self-pay | Admitting: Family Medicine

## 2022-10-01 ENCOUNTER — Ambulatory Visit (INDEPENDENT_AMBULATORY_CARE_PROVIDER_SITE_OTHER): Payer: Medicare HMO | Admitting: Family Medicine

## 2022-10-01 VITALS — BP 138/78 | HR 84 | Temp 98.0°F | Resp 16 | Ht 67.0 in | Wt 207.0 lb

## 2022-10-01 DIAGNOSIS — Z23 Encounter for immunization: Secondary | ICD-10-CM

## 2022-10-01 DIAGNOSIS — Z122 Encounter for screening for malignant neoplasm of respiratory organs: Secondary | ICD-10-CM | POA: Diagnosis not present

## 2022-10-01 DIAGNOSIS — R351 Nocturia: Secondary | ICD-10-CM | POA: Diagnosis not present

## 2022-10-01 DIAGNOSIS — L309 Dermatitis, unspecified: Secondary | ICD-10-CM | POA: Diagnosis not present

## 2022-10-01 DIAGNOSIS — G4700 Insomnia, unspecified: Secondary | ICD-10-CM

## 2022-10-01 DIAGNOSIS — N401 Enlarged prostate with lower urinary tract symptoms: Secondary | ICD-10-CM

## 2022-10-01 MED ORDER — TRIAMCINOLONE ACETONIDE 0.1 % EX CREA: 1.0000 | TOPICAL_CREAM | Freq: Two times a day (BID) | CUTANEOUS | 0 refills | Status: DC

## 2022-10-01 NOTE — Progress Notes (Signed)
Chief Complaint  Patient presents with   Follow-up    Follow up    Subjective: Patient is a 77 y.o. male here for med ck.  Patient has a history of insomnia.  He was started on Ambien 10 mg nightly.  He reports he uses it on most nights as a contingency.  He has no adverse effects and he sleeps very well using it.  He would like to stay on his current dosage.  He is taking tadalafil 5 mg daily for his prostate symptoms.  He has not had an erection/sex in over a year.  He reports compliance and no adverse effects.  His urinary symptoms are controlled.  He does not wish to make a change with this either.  Past Medical History:  Diagnosis Date   Acute myocardial infarction, unspecified site, episode of care unspecified 1985   CAD (coronary artery disease)    Cerebrovascular disease, unspecified    Coronary atherosclerosis of unspecified type of vessel, native or graft    GERD (gastroesophageal reflux disease)    Hemorrhoids    History of heart bypass surgery    MI (myocardial infarction) ([place]) 2010   Other and unspecified hyperlipidemia    Renal hematoma 2005   Wears dentures    full set   Wears glasses     Objective: BP 138/78 (BP Location: Right Arm, Patient Position: Sitting, Cuff Size: Normal)   Pulse 84   Temp 98 F (36.7 C) (Oral)   Resp 16   Ht 5\' 7"  (1.702 m)   Wt 207 lb (93.9 kg)   SpO2 97%   BMI 32.42 kg/m  General: Awake, appears stated age Heart: RRR Lungs: CTAB, no rales, wheezes or rhonchi. No accessory muscle use Abdomen: Bowel sounds present, soft, nontender, nondistended Psych: Age appropriate judgment and insight, normal affect and mood  Assessment and Plan: Insomnia, unspecified type  Benign prostatic hyperplasia with nocturia  Chronic, stable.  Continue Ambien 10 mg nightly as needed. Chronic, stable.  Continue tadalafil 5 mg daily. Recommended getting the shingles vaccine and tetanus booster at the pharmacy.  Flu shot today.  Recommended he stop  smoking.  Follow-up in 6 months for physical or as needed. The patient voiced understanding and agreement to the plan.  [place], DO 10/01/22  3:44 PM

## 2022-10-01 NOTE — Patient Instructions (Addendum)
Let me know when you need refills.   Go to the pharmacy to inquire about the tetanus booster and shingles shot.   If the skin does not improve in 2 weeks, come back for a biopsy.   Let us know if you need anything.

## 2022-10-01 NOTE — Addendum Note (Signed)
Addended by: Laure Kidney on: 10/01/2022 04:06 PM   Modules accepted: Orders

## 2022-10-12 ENCOUNTER — Encounter: Payer: Self-pay | Admitting: Family Medicine

## 2022-10-28 ENCOUNTER — Telehealth: Payer: Self-pay | Admitting: Family Medicine

## 2022-10-28 DIAGNOSIS — N401 Enlarged prostate with lower urinary tract symptoms: Secondary | ICD-10-CM

## 2022-10-28 DIAGNOSIS — N529 Male erectile dysfunction, unspecified: Secondary | ICD-10-CM

## 2022-10-28 MED ORDER — TADALAFIL 5 MG PO TABS: 5.0000 mg | ORAL_TABLET | Freq: Every day | ORAL | 2 refills | Status: DC

## 2022-10-28 NOTE — Telephone Encounter (Signed)
Rx sent 

## 2022-10-28 NOTE — Telephone Encounter (Signed)
Prescription Request  10/28/2022  Is this a "Controlled Substance" medicine? No  LOV: 10/01/2022  What is the name of the medication or equipment?  tadalafil (CIALIS) 5 MG tablet  (90 day supply) Have you contacted your pharmacy to request a refill? Yes   Which pharmacy would you like this sent to?   [place] [address] Phone: [phone] Fax: [phone]    Patient notified that their request is being sent to the clinical staff for review and that they should receive a response within 2 business days.   Please advise at Mobile There is no such number on file (mobile).

## 2022-11-03 ENCOUNTER — Other Ambulatory Visit: Payer: Self-pay | Admitting: Family Medicine

## 2022-11-03 DIAGNOSIS — G4700 Insomnia, unspecified: Secondary | ICD-10-CM

## 2022-11-03 NOTE — Telephone Encounter (Signed)
Last RF---05/07/2022 Last OV---10/01/2022

## 2023-01-26 ENCOUNTER — Ambulatory Visit (INDEPENDENT_AMBULATORY_CARE_PROVIDER_SITE_OTHER): Payer: Medicare HMO | Admitting: *Deleted

## 2023-01-26 DIAGNOSIS — Z122 Encounter for screening for malignant neoplasm of respiratory organs: Secondary | ICD-10-CM | POA: Diagnosis not present

## 2023-01-26 DIAGNOSIS — Z0000 Encounter for general adult medical examination without abnormal findings: Secondary | ICD-10-CM

## 2023-01-26 NOTE — Progress Notes (Signed)
Subjective:   Ethan Estrada is a 77 y.o. male who presents for Medicare Annual/Subsequent preventive examination.  I connected with  Emilie Rutter on 01/26/23 by a audio enabled telemedicine application and verified that I am speaking with the correct person using two identifiers.  Patient Location: Home  Provider Location: Office/Clinic  I discussed the limitations of evaluation and management by telemedicine. The patient expressed understanding and agreed to proceed.   Review of Systems     Cardiac Risk Factors include: advanced age (>31men, >3 women);male gender;dyslipidemia     Objective:    There were no vitals filed for this visit. There is no height or weight on file to calculate BMI.     01/26/2023    8:55 AM 01/22/2022    8:56 AM 02/12/2021   10:53 AM 12/18/2020    2:54 PM 12/07/2019    2:37 PM 08/28/2019    3:05 PM 10/13/2018    6:00 AM  Advanced Directives  Does Patient Have a Medical Advance Directive? Yes Yes Yes Yes Yes Yes Yes  Type of Estate agent of [place];Living will Healthcare Power of [place];Living will Healthcare Power of [place];Living will Healthcare Power of [place];Living will Healthcare Power of [place];Living will Healthcare Power of [place];Living will Healthcare Power of [place];Living will  Does patient want to make changes to medical advance directive?   No - Guardian declined  No - Patient declined No - Patient declined No - Patient declined  Copy of Healthcare Power of Attorney in Chart? Yes - validated most recent copy scanned in chart (See row information) No - copy requested No - copy requested No - copy requested No - copy requested      Current Medications (verified) Outpatient Encounter Medications as of 01/26/2023  Medication Sig   aspirin 81 MG tablet Take 1 tablet (81 mg total) by mouth daily.   atorvastatin (LIPITOR) 80 MG tablet TAKE 1 TABLET EVERY DAY  AT  6PM (NEED MD APPOINTMENT FOR REFILLS)    ezetimibe (ZETIA) 10 MG tablet Take 1 tablet (10 mg total) by mouth daily.   metoprolol tartrate (LOPRESSOR) 50 MG tablet TAKE 1 TABLET TWICE DAILY   omeprazole (PRILOSEC) 10 MG capsule Take 10 mg by mouth daily.    tadalafil (CIALIS) 5 MG tablet Take 1 tablet (5 mg total) by mouth daily.   triamcinolone cream (KENALOG) 0.1 % Apply 1 Application topically 2 (two) times daily.   zolpidem (AMBIEN) 10 MG tablet TAKE 1/2 TO 1 TABLET AT BEDTIME AS NEEDED FOR SLEEP   No facility-administered encounter medications on file as of 01/26/2023.    Allergies (verified) Patient has no known allergies.   History: Past Medical History:  Diagnosis Date   Acute myocardial infarction, unspecified site, episode of care unspecified 1985   CAD (coronary artery disease)    Cerebrovascular disease, unspecified    Coronary atherosclerosis of unspecified type of vessel, native or graft    GERD (gastroesophageal reflux disease)    Hemorrhoids    History of heart bypass surgery    MI (myocardial infarction) (HCC) 2010   Other and unspecified hyperlipidemia    Renal hematoma 2005   Wears dentures    full set   Wears glasses    Past Surgical History:  Procedure Laterality Date   COLONOSCOPY WITH ESOPHAGOGASTRODUODENOSCOPY (EGD)  yrs ago due 2022   CORONARY ARTERY BYPASS GRAFT  x 3   S/P in February of 2010. Procedure performed in [place] [facility]   ENDARTERECTOMY Left  10/12/2018   Procedure: ENDARTERECTOMY CAROTID LEFT;  Surgeon: Larina Earthly, MD;  [facility];  Service: Vascular;  Laterality: Left;   EYE SURGERY Left    cataracts   HEMORRHOID SURGERY     HEMORRHOID SURGERY N/A 02/12/2021   Procedure: HEMORRHOIDECTOMY SINGLE COLUMN;  Surgeon: Romie Levee, MD;  Location: [facility];  Service: General;  Laterality: N/A;   TONSILLECTOMY  as child   Family History  Problem Relation Age of Onset   Cancer Mother 70       Leukemia   Coronary artery disease Father 74       MI   Social  History   Socioeconomic History   Marital status: Married    Spouse name: Not on file   Number of children: Not on file   Years of education: Not on file   Highest education level: Not on file  Occupational History   Occupation: Full Time  Tobacco Use   Smoking status: Every Day    Packs/day: 2.00    Years: 50.00    Additional pack years: 0.00    Total pack years: 100.00    Types: Cigars, Cigarettes    Last attempt to quit: 10/21/2008    Years since quitting: 14.2   Smokeless tobacco: Never   Tobacco comments:    Now smoking cigars 2 cigares per day  Vaping Use   Vaping Use: Never used  Substance and Sexual Activity   Alcohol use: Yes    Comment: occasionally   Drug use: No   Sexual activity: Not on file  Other Topics Concern   Not on file  Social History Narrative   No Regular Exercise   Social Determinants of Health   Financial Resource Strain: Low Risk  (01/22/2022)   Overall Financial Resource Strain (CARDIA)    Difficulty of Paying Living Expenses: Not hard at all  Food Insecurity: No Food Insecurity (01/26/2023)   Hunger Vital Sign    Worried About Running Out of Food in the Last Year: Never true    Ran Out of Food in the Last Year: Never true  Transportation Needs: No Transportation Needs (01/26/2023)   PRAPARE - Administrator, Civil Service (Medical): No    Lack of Transportation (Non-Medical): No  Physical Activity: Insufficiently Active (01/22/2022)   Exercise Vital Sign    Days of Exercise per Week: 3 days    Minutes of Exercise per Session: 20 min  Stress: No Stress Concern Present (01/22/2022)   [employer] of Occupational Health - Occupational Stress Questionnaire    Feeling of Stress : Not at all  Social Connections: Moderately Integrated (01/22/2022)   Social Connection and Isolation Panel [NHANES]    Frequency of Communication with Friends and Family: More than three times a week    Frequency of Social Gatherings with Friends and Family:  More than three times a week    Attends Religious Services: 1 to 4 times per year    Active Member of [employer] or Organizations: No    Attends Engineer, structural: Never    Marital Status: Married    Tobacco Counseling Ready to quit: Not Answered Counseling given: Not Answered Tobacco comments: Now smoking cigars 2 cigares per day   Clinical Intake:  Pre-visit preparation completed: Yes  Pain : No/denies pain  Nutritional Risks: None Diabetes: No  How often do you need to have someone help you when you read instructions, pamphlets, or other written materials from  your doctor or pharmacy?: 1 - Never  Activities of Daily Living    01/26/2023    8:59 AM  In your present state of health, do you have any difficulty performing the following activities:  Hearing? 0  Vision? 0  Difficulty concentrating or making decisions? 0  Walking or climbing stairs? 0  Dressing or bathing? 0  Doing errands, shopping? 0  Preparing Food and eating ? N  Using the Toilet? N  In the past six months, have you accidently leaked urine? N  Do you have problems with loss of bowel control? N  Managing your Medications? N  Managing your Finances? N  Housekeeping or managing your Housekeeping? N    Patient Care Team: Sharlene Dory, DO as PCP - General (Family Medicine) Jens Som Madolyn Frieze, MD as PCP - Cardiology (Cardiology)  Indicate any recent Medical Services you may have received from other than [facility] providers in the past year (date may be approximate).     Assessment:   This is a routine wellness examination for [place].  Hearing/Vision screen No results found.  Dietary issues and exercise activities discussed: Current Exercise Habits: The patient does not participate in regular exercise at present, Exercise limited by: None identified   Goals Addressed   None    Depression Screen    01/26/2023    8:59 AM 10/01/2022    3:30 PM 03/31/2022    3:36 PM 01/22/2022    8:53 AM  12/18/2020    2:56 PM 12/12/2020   12:54 PM 12/07/2019    2:42 PM  PHQ 2/9 Scores  PHQ - 2 Score 0 0 0 0 0 0 0    Fall Risk    01/26/2023    8:57 AM 10/01/2022    3:30 PM 03/31/2022    3:36 PM 01/22/2022    8:56 AM 12/18/2020    2:55 PM  Fall Risk   Falls in the past year? 0 0 1 0 0  Number falls in past yr: 0 0 0 0 0  Injury with Fall? 0 0 0 0 0  Risk for fall due to : No Fall Risks   No Fall Risks   Follow up Falls evaluation completed Falls evaluation completed  Falls prevention discussed Falls prevention discussed    FALL RISK PREVENTION PERTAINING TO THE HOME:  Any stairs in or around the home? Yes  If so, are there any without handrails? Yes  Home free of loose throw rugs in walkways, pet beds, electrical cords, etc? Yes  Adequate lighting in your home to reduce risk of falls? Yes   ASSISTIVE DEVICES UTILIZED TO PREVENT FALLS:  Life alert? No  Use of a cane, walker or w/c? No  Grab bars in the bathroom? Yes  Shower chair or bench in shower? No  Elevated toilet seat or a handicapped toilet? No   TIMED UP AND GO:  Was the test performed?  No, audio visit .    Cognitive Function:        01/26/2023    9:02 AM 01/22/2022    8:59 AM  6CIT Screen  What Year? 0 points 0 points  What month? 0 points 0 points  What time? 0 points 0 points  Count back from 20 0 points 0 points  Months in reverse 0 points 0 points  Repeat phrase 6 points 0 points  Total Score 6 points 0 points    Immunizations Immunization History  Administered Date(s) Administered   Fluad Quad(high Dose  65+) 10/01/2022   Influenza, High Dose Seasonal PF 06/29/2018, 07/04/2019   PFIZER Comirnaty(Gray Top)Covid-19 Tri-Sucrose Vaccine 01/29/2020, 02/26/2020   PNEUMOCOCCAL CONJUGATE-20 03/31/2022   Pneumococcal Polysaccharide-23 12/11/2019    TDAP status: Due, Education has been provided regarding the importance of this vaccine. Advised may receive this vaccine at local pharmacy or Health Dept. Aware to  provide a copy of the vaccination record if obtained from local pharmacy or Health Dept. Verbalized acceptance and understanding.  Flu Vaccine status: Up to date  Pneumococcal vaccine status: Up to date  Covid-19 vaccine status: Information provided on how to obtain vaccines.   Qualifies for Shingles Vaccine? Yes   Zostavax completed No   Shingrix Completed?: No.    Education has been provided regarding the importance of this vaccine. Patient has been advised to call insurance company to determine out of pocket expense if they have not yet received this vaccine. Advised may also receive vaccine at local pharmacy or Health Dept. Verbalized acceptance and understanding.  Screening Tests Health Maintenance  Topic Date Due   DTaP/Tdap/Td (1 - Tdap) Never done   Zoster Vaccines- Shingrix (1 of 2) Never done   Lung Cancer Screening  12/23/2021   Medicare Annual Wellness (AWV)  01/23/2023   COVID-19 Vaccine (3 - 2023-24 season) 04/01/2023 (Originally 05/21/2022)   INFLUENZA VACCINE  04/21/2023   Pneumonia Vaccine 58+ Years old  Completed   Hepatitis C Screening  Completed   HPV VACCINES  Aged Out   COLONOSCOPY (Pts 45-26yrs Insurance coverage will need to be confirmed)  Discontinued    Health Maintenance  Health Maintenance Due  Topic Date Due   DTaP/Tdap/Td (1 - Tdap) Never done   Zoster Vaccines- Shingrix (1 of 2) Never done   Lung Cancer Screening  12/23/2021   Medicare Annual Wellness (AWV)  01/23/2023    Colorectal cancer screening: No longer required.   Lung Cancer Screening: (Low Dose CT Chest recommended if Age 11-80 years, 30 pack-year currently smoking OR have quit w/in 15years.) does qualify.   Lung Cancer Screening Referral: order placed today   Additional Screening:  Hepatitis C Screening: does qualify; Completed 12/12/20  Vision Screening: Recommended annual ophthalmology exams for early detection of glaucoma and other disorders of the eye. Is the patient up to  date with their annual eye exam?  Yes  Who is the provider or what is the name of the office in which the patient attends annual eye exams? Dr. Logan Bores If pt is not established with a provider, would they like to be referred to a provider to establish care? No .   Dental Screening: Recommended annual dental exams for proper oral hygiene  Community Resource Referral / Chronic Care Management: CRR required this visit?  No   CCM required this visit?  No      Plan:     I have personally reviewed and noted the following in the patient's chart:   Medical and social history Use of alcohol, tobacco or illicit drugs  Current medications and supplements including opioid prescriptions. Patient is not currently taking opioid prescriptions. Functional ability and status Nutritional status Physical activity Advanced directives List of other physicians Hospitalizations, surgeries, and ER visits in previous 12 months Vitals Screenings to include cognitive, depression, and falls Referrals and appointments  In addition, I have reviewed and discussed with patient certain preventive protocols, quality metrics, and best practice recommendations. A written personalized care plan for preventive services as well as general preventive health recommendations were provided to  patient.   Due to this being a telephonic visit, the after visit summary with patients personalized plan was offered to patient via mail or my-chart. Patient declined at this time.  Donne Anon, [place]   01/26/2023   Nurse Notes: None

## 2023-01-26 NOTE — Patient Instructions (Signed)
Ethan Estrada , Thank you for taking time to come for your Medicare Wellness Visit. I appreciate your ongoing commitment to your health goals. Please review the following plan we discussed and let me know if I can assist you in the future.      This is a list of the screening recommended for you and due dates:  Health Maintenance  Topic Date Due   DTaP/Tdap/Td vaccine (1 - Tdap) Never done   Zoster (Shingles) Vaccine (1 of 2) Never done   Screening for Lung Cancer  12/23/2021   COVID-19 Vaccine (3 - 2023-24 season) 04/01/2023*   Flu Shot  04/21/2023   Medicare Annual Wellness Visit  01/26/2024   Pneumonia Vaccine  Completed   Hepatitis C Screening: USPSTF Recommendation to screen - Ages 18-79 yo.  Completed   HPV Vaccine  Aged Out   Colon Cancer Screening  Discontinued  *Topic was postponed. The date shown is not the original due date.    Next appointment: Follow up in one year for your annual wellness visit.   Preventive Care 57 Years and Older, Male Preventive care refers to lifestyle choices and visits with your health care provider that can promote health and wellness. What does preventive care include? A yearly physical exam. This is also called an annual well check. Dental exams once or twice a year. Routine eye exams. Ask your health care provider how often you should have your eyes checked. Personal lifestyle choices, including: Daily care of your teeth and gums. Regular physical activity. Eating a healthy diet. Avoiding tobacco and drug use. Limiting alcohol use. Practicing safe sex. Taking low doses of aspirin every day. Taking vitamin and mineral supplements as recommended by your health care provider. What happens during an annual well check? The services and screenings done by your health care provider during your annual well check will depend on your age, overall health, lifestyle risk factors, and family history of disease. Counseling  Your health care  provider may ask you questions about your: Alcohol use. Tobacco use. Drug use. Emotional well-being. Home and relationship well-being. Sexual activity. Eating habits. History of falls. Memory and ability to understand (cognition). Work and work Astronomer. Screening  You may have the following tests or measurements: Height, weight, and BMI. Blood pressure. Lipid and cholesterol levels. These may be checked every 5 years, or more frequently if you are over 23 years old. Skin check. Lung cancer screening. You may have this screening every year starting at age 20 if you have a 30-pack-year history of smoking and currently smoke or have quit within the past 15 years. Fecal occult blood test (FOBT) of the stool. You may have this test every year starting at age 45. Flexible sigmoidoscopy or colonoscopy. You may have a sigmoidoscopy every 5 years or a colonoscopy every 10 years starting at age 79. Prostate cancer screening. Recommendations will vary depending on your family history and other risks. Hepatitis C blood test. Hepatitis B blood test. Sexually transmitted disease (STD) testing. Diabetes screening. This is done by checking your blood sugar (glucose) after you have not eaten for a while (fasting). You may have this done every 1-3 years. Abdominal aortic aneurysm (AAA) screening. You may need this if you are a current or former smoker. Osteoporosis. You may be screened starting at age 79 if you are at high risk. Talk with your health care provider about your test results, treatment options, and if necessary, the need for more tests. Vaccines  Your health  care provider may recommend certain vaccines, such as: Influenza vaccine. This is recommended every year. Tetanus, diphtheria, and acellular pertussis (Tdap, Td) vaccine. You may need a Td booster every 10 years. Zoster vaccine. You may need this after age 42. Pneumococcal 13-valent conjugate (PCV13) vaccine. One dose is  recommended after age 55. Pneumococcal polysaccharide (PPSV23) vaccine. One dose is recommended after age 79. Talk to your health care provider about which screenings and vaccines you need and how often you need them. This information is not intended to replace advice given to you by your health care provider. Make sure you discuss any questions you have with your health care provider. Document Released: 10/03/2015 Document Revised: 05/26/2016 Document Reviewed: 07/08/2015 Elsevier Interactive Patient Education  2017 [place] Prevention in the Home Falls can cause injuries. They can happen to people of all ages. There are many things you can do to make your home safe and to help prevent falls. What can I do on the outside of my home? Regularly fix the edges of walkways and driveways and fix any cracks. Remove anything that might make you trip as you walk through a door, such as a raised step or threshold. Trim any bushes or trees on the path to your home. Use bright outdoor lighting. Clear any walking paths of anything that might make someone trip, such as rocks or tools. Regularly check to see if handrails are loose or broken. Make sure that both sides of any steps have handrails. Any raised decks and porches should have guardrails on the edges. Have any leaves, snow, or ice cleared regularly. Use sand or salt on walking paths during winter. Clean up any spills in your garage right away. This includes oil or grease spills. What can I do in the bathroom? Use night lights. Install grab bars by the toilet and in the tub and shower. Do not use towel bars as grab bars. Use non-skid mats or decals in the tub or shower. If you need to sit down in the shower, use a plastic, non-slip stool. Keep the floor dry. Clean up any water that spills on the floor as soon as it happens. Remove soap buildup in the tub or shower regularly. Attach bath mats securely with double-sided non-slip rug  tape. Do not have throw rugs and other things on the floor that can make you trip. What can I do in the bedroom? Use night lights. Make sure that you have a light by your bed that is easy to reach. Do not use any sheets or blankets that are too big for your bed. They should not hang down onto the floor. Have a firm chair that has side arms. You can use this for support while you get dressed. Do not have throw rugs and other things on the floor that can make you trip. What can I do in the kitchen? Clean up any spills right away. Avoid walking on wet floors. Keep items that you use a lot in easy-to-reach places. If you need to reach something above you, use a strong step stool that has a grab bar. Keep electrical cords out of the way. Do not use floor polish or wax that makes floors slippery. If you must use wax, use non-skid floor wax. Do not have throw rugs and other things on the floor that can make you trip. What can I do with my stairs? Do not leave any items on the stairs. Make sure that there are handrails on  both sides of the stairs and use them. Fix handrails that are broken or loose. Make sure that handrails are as long as the stairways. Check any carpeting to make sure that it is firmly attached to the stairs. Fix any carpet that is loose or worn. Avoid having throw rugs at the top or bottom of the stairs. If you do have throw rugs, attach them to the floor with carpet tape. Make sure that you have a light switch at the top of the stairs and the bottom of the stairs. If you do not have them, ask someone to add them for you. What else can I do to help prevent falls? Wear shoes that: Do not have high heels. Have rubber bottoms. Are comfortable and fit you well. Are closed at the toe. Do not wear sandals. If you use a stepladder: Make sure that it is fully opened. Do not climb a closed stepladder. Make sure that both sides of the stepladder are locked into place. Ask someone to  hold it for you, if possible. Clearly mark and make sure that you can see: Any grab bars or handrails. First and last steps. Where the edge of each step is. Use tools that help you move around (mobility aids) if they are needed. These include: Canes. Walkers. Scooters. Crutches. Turn on the lights when you go into a dark area. Replace any light bulbs as soon as they burn out. Set up your furniture so you have a clear path. Avoid moving your furniture around. If any of your floors are uneven, fix them. If there are any pets around you, be aware of where they are. Review your medicines with your doctor. Some medicines can make you feel dizzy. This can increase your chance of falling. Ask your doctor what other things that you can do to help prevent falls. This information is not intended to replace advice given to you by your health care provider. Make sure you discuss any questions you have with your health care provider. Document Released: 07/03/2009 Document Revised: 02/12/2016 Document Reviewed: 10/11/2014 Elsevier Interactive Patient Education  2017 [employer].

## 2023-02-02 ENCOUNTER — Telehealth (HOSPITAL_BASED_OUTPATIENT_CLINIC_OR_DEPARTMENT_OTHER): Payer: Self-pay

## 2023-03-30 ENCOUNTER — Other Ambulatory Visit: Payer: Self-pay | Admitting: Cardiology

## 2023-03-30 DIAGNOSIS — E7800 Pure hypercholesterolemia, unspecified: Secondary | ICD-10-CM

## 2023-05-02 DIAGNOSIS — Z961 Presence of intraocular lens: Secondary | ICD-10-CM | POA: Diagnosis not present

## 2023-05-02 DIAGNOSIS — H18521 Epithelial (juvenile) corneal dystrophy, right eye: Secondary | ICD-10-CM | POA: Diagnosis not present

## 2023-05-02 DIAGNOSIS — H524 Presbyopia: Secondary | ICD-10-CM | POA: Diagnosis not present

## 2023-05-02 DIAGNOSIS — H2511 Age-related nuclear cataract, right eye: Secondary | ICD-10-CM | POA: Diagnosis not present

## 2023-05-05 ENCOUNTER — Other Ambulatory Visit: Payer: Self-pay | Admitting: Family Medicine

## 2023-05-05 DIAGNOSIS — G4700 Insomnia, unspecified: Secondary | ICD-10-CM

## 2023-05-07 ENCOUNTER — Other Ambulatory Visit: Payer: Self-pay | Admitting: Cardiology

## 2023-05-10 ENCOUNTER — Other Ambulatory Visit: Payer: Self-pay | Admitting: *Deleted

## 2023-05-10 DIAGNOSIS — I6523 Occlusion and stenosis of bilateral carotid arteries: Secondary | ICD-10-CM

## 2023-05-13 ENCOUNTER — Telehealth (HOSPITAL_BASED_OUTPATIENT_CLINIC_OR_DEPARTMENT_OTHER): Payer: Self-pay

## 2023-05-24 DIAGNOSIS — H2511 Age-related nuclear cataract, right eye: Secondary | ICD-10-CM | POA: Diagnosis not present

## 2023-05-24 DIAGNOSIS — H25811 Combined forms of age-related cataract, right eye: Secondary | ICD-10-CM | POA: Diagnosis not present

## 2023-06-11 ENCOUNTER — Other Ambulatory Visit: Payer: Self-pay | Admitting: Cardiology

## 2023-06-11 DIAGNOSIS — E7800 Pure hypercholesterolemia, unspecified: Secondary | ICD-10-CM

## 2023-06-28 DIAGNOSIS — H35351 Cystoid macular degeneration, right eye: Secondary | ICD-10-CM | POA: Diagnosis not present

## 2023-07-21 ENCOUNTER — Other Ambulatory Visit: Payer: Self-pay | Admitting: Cardiology

## 2023-07-28 DIAGNOSIS — Z0100 Encounter for examination of eyes and vision without abnormal findings: Secondary | ICD-10-CM | POA: Diagnosis not present

## 2023-07-28 DIAGNOSIS — H524 Presbyopia: Secondary | ICD-10-CM | POA: Diagnosis not present

## 2023-07-28 DIAGNOSIS — H35351 Cystoid macular degeneration, right eye: Secondary | ICD-10-CM | POA: Diagnosis not present

## 2023-08-09 ENCOUNTER — Other Ambulatory Visit: Payer: Self-pay | Admitting: Family Medicine

## 2023-08-09 DIAGNOSIS — N529 Male erectile dysfunction, unspecified: Secondary | ICD-10-CM

## 2023-08-09 DIAGNOSIS — N401 Enlarged prostate with lower urinary tract symptoms: Secondary | ICD-10-CM

## 2023-08-15 ENCOUNTER — Telehealth: Payer: Self-pay | Admitting: Family Medicine

## 2023-08-15 NOTE — Telephone Encounter (Signed)
He's due for a CPE with [place], plz sched. Ty.

## 2023-08-15 NOTE — Telephone Encounter (Signed)
Prescription Request  08/15/2023  Is this a "Controlled Substance" medicine? Yes  LOV: Visit date not found  What is the name of the medication or equipment?   zolpidem (AMBIEN) 10 MG tablet [[id number]]  Have you contacted your pharmacy to request a refill? Yes   Which pharmacy would you like this sent to?  [facility] - [place], [place] - [address] [place] [address] Phone: [phone] Fax: [phone]  Patient notified that their request is being sent to the clinical staff for review and that they should receive a response within 2 business days.   Please advise at [facility] [phone]

## 2023-08-15 NOTE — Telephone Encounter (Signed)
Appt scheduled tomorrow 415.

## 2023-08-16 ENCOUNTER — Encounter: Payer: Self-pay | Admitting: Family Medicine

## 2023-08-16 ENCOUNTER — Ambulatory Visit: Payer: Medicare HMO | Admitting: Family Medicine

## 2023-08-16 VITALS — BP 130/78 | HR 78 | Temp 98.0°F | Resp 16 | Ht 67.0 in | Wt 211.8 lb

## 2023-08-16 DIAGNOSIS — R600 Localized edema: Secondary | ICD-10-CM | POA: Diagnosis not present

## 2023-08-16 DIAGNOSIS — L309 Dermatitis, unspecified: Secondary | ICD-10-CM | POA: Diagnosis not present

## 2023-08-16 DIAGNOSIS — Z23 Encounter for immunization: Secondary | ICD-10-CM | POA: Diagnosis not present

## 2023-08-16 DIAGNOSIS — G4700 Insomnia, unspecified: Secondary | ICD-10-CM | POA: Diagnosis not present

## 2023-08-16 MED ORDER — FUROSEMIDE 40 MG PO TABS: 40.0000 mg | ORAL_TABLET | Freq: Every day | ORAL | 3 refills | Status: DC

## 2023-08-16 MED ORDER — ZOLPIDEM TARTRATE 10 MG PO TABS: 10.0000 mg | ORAL_TABLET | Freq: Every evening | ORAL | 1 refills | Status: DC | PRN

## 2023-08-16 MED ORDER — TRIAMCINOLONE ACETONIDE 0.1 % EX CREA: 1.0000 | TOPICAL_CREAM | Freq: Two times a day (BID) | CUTANEOUS | 2 refills | Status: DC

## 2023-08-16 NOTE — Progress Notes (Unsigned)
Chief Complaint  Patient presents with   Follow-up   Medication Refill    Refill Medication    Subjective: Patient is a 77 y.o. male here for f/u.  Patient has a history of insomnia.  He is taking Ambien 10 mg nightly as needed.  He reports compliance and no adverse effects.  It is working very well.  Patient has a history of eczema over her shoulders.  When uses Kenalog, and it works very well eliminating his symptoms.  He has not had a flare recently but does need a refill of the Kenalog.  No adverse effects from using this medication.  Patient has a year-long history of swelling in both of his legs.  No calf pain.  He has used compression stockings intermittently as well as elevation which does help.  Diet is fair overall.  He tries to stay active with walking.  No chest pain or shortness of breath.  Past Medical History:  Diagnosis Date   Acute myocardial infarction, unspecified site, episode of care unspecified 1985   CAD (coronary artery disease)    Cerebrovascular disease, unspecified    Coronary atherosclerosis of unspecified type of vessel, native or graft    GERD (gastroesophageal reflux disease)    Hemorrhoids    History of heart bypass surgery    MI (myocardial infarction) (HCC) 2010   Other and unspecified hyperlipidemia    Renal hematoma 2005   Wears dentures    full set   Wears glasses     Objective: BP 130/78 (BP Location: Left Arm, Patient Position: Sitting, Cuff Size: Normal)   Pulse 78   Temp 98 F (36.7 C) (Oral)   Resp 16   Ht 5\' 7"  (1.702 m)   Wt 211 lb 12.8 oz (96.1 kg)   SpO2 97%   BMI 33.17 kg/m  General: Awake, appears stated age Heart: RRR, 2+ pitting bilateral LE edema tapering at the knees Lungs: CTAB, no rales, wheezes or rhonchi. No accessory muscle use MSK: No calf pain bilaterally, negative Homans bilaterally Psych: Age appropriate judgment and insight, normal affect and mood  Assessment and Plan: Insomnia, unspecified type - Plan:  zolpidem (AMBIEN) 10 MG tablet  Eczema, unspecified type - Plan: triamcinolone cream (KENALOG) 0.1 %  Bilateral lower extremity edema - Plan: Comprehensive metabolic panel, TSH, furosemide (LASIX) 40 MG tablet, Basic metabolic panel  Need for influenza vaccination - Plan: Flu Vaccine Trivalent High Dose (Fluad)  Chronic, stable.  Continue Ambien 10 mg nightly as needed. Chronic, stable.  Continue Kenalog as needed. Chronic, unstable.  Elevation, compression stockings, mending salt intake, physical activity recommended.  Check above labs.  Add furosemide 40 mg daily.  Follow-up in 1 month to recheck.  Follow-up in 1 week for labs. Flu shot today. The patient voiced understanding and agreement to the plan.  Jilda Roche [place], DO 08/16/23  4:48 PM

## 2023-08-16 NOTE — Patient Instructions (Addendum)
For the swelling in your lower extremities, be sure to elevate your legs when able, mind the salt intake, stay physically active and consider wearing compression stockings.  Give [place] 2-3 business days to get the results of your labs back.   Let us know if you need anything.

## 2023-08-17 ENCOUNTER — Other Ambulatory Visit: Payer: Self-pay

## 2023-08-17 DIAGNOSIS — E876 Hypokalemia: Secondary | ICD-10-CM

## 2023-08-17 DIAGNOSIS — E875 Hyperkalemia: Secondary | ICD-10-CM

## 2023-08-17 LAB — COMPREHENSIVE METABOLIC PANEL
ALT: 12 U/L (ref 0–53)
AST: 19 U/L (ref 0–37)
Albumin: 4.1 g/dL (ref 3.5–5.2)
Alkaline Phosphatase: 82 U/L (ref 39–117)
BUN: 11 mg/dL (ref 6–23)
CO2: 28 meq/L (ref 19–32)
Calcium: 9.4 mg/dL (ref 8.4–10.5)
Chloride: 106 meq/L (ref 96–112)
Creatinine, Ser: 1.33 mg/dL (ref 0.40–1.50)
GFR: 50.8 mL/min — ABNORMAL LOW (ref >60.00)
Glucose, Bld: 110 mg/dL — ABNORMAL HIGH (ref 70–99)
Potassium: 5.2 meq/L — ABNORMAL HIGH (ref 3.5–5.1)
Sodium: 141 meq/L (ref 135–145)
Total Bilirubin: 0.8 mg/dL (ref 0.2–1.2)
Total Protein: 6.6 g/dL (ref 6.0–8.3)

## 2023-08-17 LAB — TSH: TSH: 1.9 u[IU]/mL (ref 0.35–5.50)

## 2023-08-23 ENCOUNTER — Other Ambulatory Visit (INDEPENDENT_AMBULATORY_CARE_PROVIDER_SITE_OTHER): Payer: Medicare HMO

## 2023-08-23 DIAGNOSIS — E875 Hyperkalemia: Secondary | ICD-10-CM

## 2023-08-24 ENCOUNTER — Other Ambulatory Visit: Payer: Self-pay

## 2023-08-24 DIAGNOSIS — E875 Hyperkalemia: Secondary | ICD-10-CM

## 2023-08-24 LAB — BASIC METABOLIC PANEL
BUN: 14 mg/dL (ref 6–23)
CO2: 31 meq/L (ref 19–32)
Calcium: 9.4 mg/dL (ref 8.4–10.5)
Chloride: 103 meq/L (ref 96–112)
Creatinine, Ser: 1.46 mg/dL (ref 0.40–1.50)
GFR: 45.41 mL/min — ABNORMAL LOW (ref >60.00)
Glucose, Bld: 144 mg/dL — ABNORMAL HIGH (ref 70–99)
Potassium: 5.2 meq/L — ABNORMAL HIGH (ref 3.5–5.1)
Sodium: 141 meq/L (ref 135–145)

## 2023-08-25 ENCOUNTER — Telehealth: Payer: Self-pay | Admitting: *Deleted

## 2023-08-25 DIAGNOSIS — E875 Hyperkalemia: Secondary | ICD-10-CM

## 2023-08-25 NOTE — Telephone Encounter (Signed)
We are unable to add CBC.

## 2023-08-25 NOTE — Telephone Encounter (Signed)
[facility] called stating they are unable to add CBC to labs from 12/3 since only chemistry tests were ordered and CBC will require a different type of tube.

## 2023-08-25 NOTE — Telephone Encounter (Signed)
Could be betablocker. I will order some more labs, have him come at convenience, apologies for frequent pokes. Ty

## 2023-08-26 NOTE — Telephone Encounter (Signed)
Called pt - lab appt scheduled.

## 2023-08-29 ENCOUNTER — Other Ambulatory Visit (INDEPENDENT_AMBULATORY_CARE_PROVIDER_SITE_OTHER): Payer: Medicare HMO

## 2023-08-29 DIAGNOSIS — E875 Hyperkalemia: Secondary | ICD-10-CM | POA: Diagnosis not present

## 2023-08-30 LAB — CBC WITH DIFFERENTIAL/PLATELET
Basophils Absolute: 0.1 10*3/uL (ref 0.0–0.1)
Basophils Relative: 1 % (ref 0.0–3.0)
Eosinophils Absolute: 0.5 10*3/uL (ref 0.0–0.7)
Eosinophils Relative: 6.3 % — ABNORMAL HIGH (ref 0.0–5.0)
HCT: 41 % (ref 39.0–52.0)
Hemoglobin: 12.8 g/dL — ABNORMAL LOW (ref 13.0–17.0)
Lymphocytes Relative: 15.3 % (ref 12.0–46.0)
Lymphs Abs: 1.3 10*3/uL (ref 0.7–4.0)
MCHC: 31.4 g/dL (ref 30.0–36.0)
MCV: 80.8 fL (ref 78.0–100.0)
Monocytes Absolute: 0.7 10*3/uL (ref 0.1–1.0)
Monocytes Relative: 8 % (ref 3.0–12.0)
Neutro Abs: 6 10*3/uL (ref 1.4–7.7)
Neutrophils Relative %: 69.4 % (ref 43.0–77.0)
Platelets: 265 10*3/uL (ref 150.0–400.0)
RBC: 5.07 Mil/uL (ref 4.22–5.81)
RDW: 19 % — ABNORMAL HIGH (ref 11.5–15.5)
WBC: 8.6 10*3/uL (ref 4.0–10.5)

## 2023-08-30 LAB — BASIC METABOLIC PANEL
BUN: 12 mg/dL (ref 6–23)
CO2: 32 meq/L (ref 19–32)
Calcium: 9.4 mg/dL (ref 8.4–10.5)
Chloride: 102 meq/L (ref 96–112)
Creatinine, Ser: 1.31 mg/dL (ref 0.40–1.50)
GFR: 51.72 mL/min — ABNORMAL LOW (ref >60.00)
Glucose, Bld: 128 mg/dL — ABNORMAL HIGH (ref 70–99)
Potassium: 4.6 meq/L (ref 3.5–5.1)
Sodium: 142 meq/L (ref 135–145)

## 2023-08-30 LAB — LACTATE DEHYDROGENASE: LDH: 146 U/L (ref 120–250)

## 2023-08-31 ENCOUNTER — Other Ambulatory Visit: Payer: Self-pay

## 2023-08-31 DIAGNOSIS — E611 Iron deficiency: Secondary | ICD-10-CM

## 2023-09-01 ENCOUNTER — Ambulatory Visit (INDEPENDENT_AMBULATORY_CARE_PROVIDER_SITE_OTHER): Payer: Medicare HMO

## 2023-09-01 DIAGNOSIS — E611 Iron deficiency: Secondary | ICD-10-CM | POA: Diagnosis not present

## 2023-09-01 LAB — IBC + FERRITIN
Ferritin: 7.1 ng/mL — ABNORMAL LOW (ref 22.0–322.0)
Iron: 44 ug/dL (ref 42–165)
Saturation Ratios: 12.6 % — ABNORMAL LOW (ref 20.0–50.0)
TIBC: 348.6 ug/dL (ref 250.0–450.0)
Transferrin: 249 mg/dL (ref 212.0–360.0)

## 2023-09-05 ENCOUNTER — Other Ambulatory Visit: Payer: Self-pay

## 2023-09-05 DIAGNOSIS — E611 Iron deficiency: Secondary | ICD-10-CM

## 2023-09-16 NOTE — Progress Notes (Signed)
 HPI: FU coronary disease. The patient underwent coronary artery bypass and graft in [place] in February of 2010 (LIMA to the LAD, saphenous vein graft to the circumflex, and free RIMA to the ramus). Note an echocardiogram in February of 2010 showed an ejection fraction of 50-55%. Abdominal ultrasound August 2018 showed no aneurysm.  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  Nuclear study August 2018 showed ejection fraction 50%.  There was prior infarct but no ischemia. Had left carotid endarterectomy in January 2020.    Carotid Dopplers 9/23 showed 1 to 39% right and 50-69% left stenosis.  Since he was last seen, he has some dyspnea on exertion but no orthopnea or PND.  Pedal edema.  No chest pain or syncope.  Current Outpatient Medications  Medication Sig Dispense Refill   aspirin  81 MG tablet Take 1 tablet (81 mg total) by mouth daily.     atorvastatin  (LIPITOR ) 80 MG tablet TAKE 1 TABLET EVERY DAY AT 6PM (NEED MD APPOINTMENT FOR REFILLS) 90 tablet 0   ezetimibe  (ZETIA ) 10 MG tablet TAKE 1 TABLET EVERY DAY 90 tablet 3   furosemide  (LASIX ) 40 MG tablet Take 1 tablet (40 mg total) by mouth daily. 30 tablet 3   metoprolol  tartrate (LOPRESSOR ) 50 MG tablet TAKE 1 TABLET TWICE DAILY 180 tablet 3   omeprazole (PRILOSEC) 10 MG capsule Take 10 mg by mouth daily.      tadalafil  (CIALIS ) 5 MG tablet Take 1 tablet by mouth once daily 90 tablet 0   triamcinolone  cream (KENALOG ) 0.1 % Apply 1 Application topically 2 (two) times daily. 30 g 2   zolpidem  (AMBIEN ) 10 MG tablet Take 1 tablet (10 mg total) by mouth at bedtime as needed for sleep. 90 tablet 1   No current facility-administered medications for this visit.     Past Medical History:  Diagnosis Date   Acute myocardial infarction, unspecified site, episode of care unspecified 1985   CAD (coronary artery disease)    Cerebrovascular disease, unspecified    Coronary atherosclerosis of unspecified type of vessel, native or graft     GERD (gastroesophageal reflux disease)    Hemorrhoids    History of heart bypass surgery    MI (myocardial infarction) (HCC) 2010   Other and unspecified hyperlipidemia    Renal hematoma 2005   Wears dentures    full set   Wears glasses     Past Surgical History:  Procedure Laterality Date   COLONOSCOPY WITH ESOPHAGOGASTRODUODENOSCOPY (EGD)  yrs ago due 2022   CORONARY ARTERY BYPASS GRAFT  x 3   S/P in February of 2010. Procedure performed in [place]   ENDARTERECTOMY Left 10/12/2018   Procedure: ENDARTERECTOMY CAROTID LEFT;  Surgeon: Oris Krystal FALCON, MD;  Location: [facility] OR;  Service: Vascular;  Laterality: Left;   EYE SURGERY Left    cataracts   HEMORRHOID SURGERY     HEMORRHOID SURGERY N/A 02/12/2021   Procedure: HEMORRHOIDECTOMY SINGLE COLUMN;  Surgeon: Debby Hila, MD;  Location: [facility];  Service: General;  Laterality: N/A;   TONSILLECTOMY  as child    Social History   Socioeconomic History   Marital status: Married    Spouse name: Not on file   Number of children: Not on file   Years of education: Not on file   Highest education level: Not on file  Occupational History   Occupation: Full Time  Tobacco Use   Smoking status: Every Day    Current packs/day:  0.00    Average packs/day: 2.0 packs/day for 50.0 years (100.0 ttl pk-yrs)    Types: Cigars, Cigarettes    Start date: 10/21/1958    Last attempt to quit: 10/21/2008    Years since quitting: 14.9   Smokeless tobacco: Never   Tobacco comments:    Now smoking cigars 2 cigares per day  Vaping Use   Vaping status: Never Used  Substance and Sexual Activity   Alcohol use: Yes    Comment: occasionally   Drug use: No   Sexual activity: Not on file  Other Topics Concern   Not on file  Social History Narrative   No Regular Exercise   Social Drivers of Health   Financial Resource Strain: Low Risk  (01/22/2022)   Overall Financial Resource Strain (CARDIA)    Difficulty of Paying Living Expenses:  Not hard at all  Food Insecurity: No Food Insecurity (01/26/2023)   Hunger Vital Sign    Worried About Running Out of Food in the Last Year: Never true    Ran Out of Food in the Last Year: Never true  Transportation Needs: No Transportation Needs (01/26/2023)   PRAPARE - Administrator, Civil Service (Medical): No    Lack of Transportation (Non-Medical): No  Physical Activity: Insufficiently Active (01/22/2022)   Exercise Vital Sign    Days of Exercise per Week: 3 days    Minutes of Exercise per Session: 20 min  Stress: No Stress Concern Present (01/22/2022)   [employer] of Occupational Health - Occupational Stress Questionnaire    Feeling of Stress : Not at all  Social Connections: Moderately Integrated (01/22/2022)   Social Connection and Isolation Panel [NHANES]    Frequency of Communication with Friends and Family: More than three times a week    Frequency of Social Gatherings with Friends and Family: More than three times a week    Attends Religious Services: 1 to 4 times per year    Active Member of [employer] or Organizations: No    Attends Banker Meetings: Never    Marital Status: Married  Catering Manager Violence: Not At Risk (01/26/2023)   Humiliation, Afraid, Rape, and Kick questionnaire    Fear of Current or Ex-Partner: No    Emotionally Abused: No    Physically Abused: No    Sexually Abused: No    Family History  Problem Relation Age of Onset   Cancer Mother 55       Leukemia   Coronary artery disease Father 46       MI    ROS: no fevers or chills, productive cough, hemoptysis, dysphasia, odynophagia, melena, hematochezia, dysuria, hematuria, rash, seizure activity, orthopnea, PND, claudication. Remaining systems are negative.  Physical Exam: Well-developed well-nourished in no acute distress.  Skin is warm and dry.  HEENT is normal.  Neck is supple.  Chest is clear to auscultation with normal expansion.  Cardiovascular exam is regular rate  and rhythm.  Abdominal exam nontender or distended. No masses palpated. Extremities show trace edema. neuro grossly intact  EKG Interpretation Date/Time:  Wednesday September 28 2023 10:40:37 EST Ventricular Rate:  70 PR Interval:  258 QRS Duration:  124 QT Interval:  416 QTC Calculation: 449 R Axis:   62  Text Interpretation: Sinus rhythm with 1st degree A-V block Cannot rule out Inferior infarct , age undetermined When compared with ECG of 12-Jan-2007 05:01, PR interval has increased Inverted T waves have replaced nonspecific T wave abnormality in Inferior leads  Confirmed by Pietro Rogue ([id number]) on 09/28/2023 10:54:56 AM    A/P  1 coronary artery disease-patient denies chest pain.  Continue aspirin  and statin.  2 carotid artery disease-we will arrange follow-up carotid Dopplers.  3 hyperlipidemia-continue statin and Zetia .  Check lipids; recent liver functions normal..  4 tobacco abuse-patient has been again counseled on discontinuing.  5 dyspnea/lower extremity edema-likely some venous insufficiency.  Will repeat echocardiogram to better assess LV function.  Rogue Pietro, MD

## 2023-09-28 ENCOUNTER — Ambulatory Visit: Payer: Medicare HMO | Attending: Cardiology | Admitting: Cardiology

## 2023-09-28 ENCOUNTER — Encounter: Payer: Self-pay | Admitting: Cardiology

## 2023-09-28 VITALS — BP 140/64 | HR 70 | Ht 67.0 in | Wt 211.0 lb

## 2023-09-28 DIAGNOSIS — I6523 Occlusion and stenosis of bilateral carotid arteries: Secondary | ICD-10-CM

## 2023-09-28 DIAGNOSIS — I2510 Atherosclerotic heart disease of native coronary artery without angina pectoris: Secondary | ICD-10-CM

## 2023-09-28 DIAGNOSIS — R0602 Shortness of breath: Secondary | ICD-10-CM | POA: Diagnosis not present

## 2023-09-28 NOTE — Patient Instructions (Signed)
   Testing/Procedures:  Your physician has requested that you have an echocardiogram. Echocardiography is a painless test that uses sound waves to create images of your heart. It provides your doctor with information about the size and shape of your heart and how well your heart's chambers and valves are working. This procedure takes approximately one hour. There are no restrictions for this procedure. Please do NOT wear cologne, perfume, aftershave, or lotions (deodorant is allowed). Please arrive 15 minutes prior to your appointment time.  Please note: We ask at that you not bring children with you during ultrasound (echo/ vascular) testing. Due to room size and safety concerns, children are not allowed in the ultrasound rooms during exams. Our front office staff cannot provide observation of children in our lobby area while testing is being conducted. An adult accompanying a patient to their appointment will only be allowed in the ultrasound room at the discretion of the ultrasound technician under special circumstances. We apologize for any inconvenience. [facility] 1 ST FLOOR IMAGING DEPARTMENT  Your physician has requested that you have a carotid duplex. This test is an ultrasound of the carotid arteries in your neck. It looks at blood flow through these arteries that supply the brain with blood. Allow one hour for this exam. There are no restrictions or special instructions. [facility] [place]   Follow-Up: At [facility], you and your health needs are our priority.  As part of our continuing mission to provide you with exceptional heart care, we have created designated Provider Care Teams.  These Care Teams include your primary Cardiologist (physician) and Advanced Practice Providers (APPs -  Physician Assistants and Nurse Practitioners) who all work together to provide you with the care you need, when you need it.  We recommend signing up for the patient portal called  MyChart.  Sign up information is provided on this After Visit Summary.  MyChart is used to connect with patients for Virtual Visits (Telemedicine).  Patients are able to view lab/test results, encounter notes, upcoming appointments, etc.  Non-urgent messages can be sent to your provider as well.   To learn more about what you can do with MyChart, go to [email or web address].    Your next appointment:   12 month(s)  Provider:   Redell Shallow, MD

## 2023-09-29 ENCOUNTER — Encounter: Payer: Self-pay | Admitting: *Deleted

## 2023-09-29 LAB — LIPID PANEL
Chol/HDL Ratio: 3 {ratio} (ref 0.0–5.0)
Cholesterol, Total: 96 mg/dL — ABNORMAL LOW (ref 100–199)
HDL: 32 mg/dL — ABNORMAL LOW (ref >39)
LDL Chol Calc (NIH): 47 mg/dL (ref 0–99)
Triglycerides: 87 mg/dL (ref 0–149)
VLDL Cholesterol Cal: 17 mg/dL (ref 5–40)

## 2023-10-03 ENCOUNTER — Other Ambulatory Visit: Payer: Self-pay | Admitting: Cardiology

## 2023-10-03 ENCOUNTER — Other Ambulatory Visit: Payer: Self-pay

## 2023-10-03 ENCOUNTER — Telehealth: Payer: Self-pay

## 2023-10-03 DIAGNOSIS — E611 Iron deficiency: Secondary | ICD-10-CM

## 2023-10-03 NOTE — Telephone Encounter (Signed)
 Called pt.

## 2023-10-24 ENCOUNTER — Ambulatory Visit (HOSPITAL_BASED_OUTPATIENT_CLINIC_OR_DEPARTMENT_OTHER)
Admission: RE | Admit: 2023-10-24 | Discharge: 2023-10-24 | Disposition: A | Discharge status: Home / Self Care | Payer: Medicare HMO | Source: Ambulatory Visit | Attending: Cardiology | Admitting: Cardiology

## 2023-10-24 DIAGNOSIS — R0602 Shortness of breath: Secondary | ICD-10-CM | POA: Insufficient documentation

## 2023-10-24 DIAGNOSIS — I6523 Occlusion and stenosis of bilateral carotid arteries: Secondary | ICD-10-CM

## 2023-10-24 DIAGNOSIS — I2510 Atherosclerotic heart disease of native coronary artery without angina pectoris: Secondary | ICD-10-CM | POA: Diagnosis not present

## 2023-10-24 DIAGNOSIS — I10 Essential (primary) hypertension: Secondary | ICD-10-CM

## 2023-10-24 LAB — ECHOCARDIOGRAM COMPLETE
AR max vel: 1.62 cm2
AV Area VTI: 1.74 cm2
AV Area mean vel: 1.74 cm2
AV Mean grad: 3 mm[Hg]
AV Peak grad: 6 mm[Hg]
AV Vena cont: 0.2 cm
Ao pk vel: 1.22 m/s
Area-P 1/2: 5.62 cm2
Calc EF: 49.5 %
MV M vel: 3.58 m/s
MV Peak grad: 51.3 mm[Hg]
P 1/2 time: 1726 ms
S' Lateral: 3.6 cm
Single Plane A2C EF: 50.3 %
Single Plane A4C EF: 51.1 %

## 2023-10-25 ENCOUNTER — Telehealth: Payer: Self-pay | Admitting: *Deleted

## 2023-10-25 NOTE — Telephone Encounter (Addendum)
-----   Message from Redell Shallow sent at 10/24/2023  4:28 PM EST ----- Normal LV function; mild MR; continue present meds Redell Shallow  Schedule CTA of carotids to better assess left carotid endarterectomy site  [facility]   Left message for pt to call

## 2023-10-26 NOTE — Telephone Encounter (Signed)
 Pt's wife is returning call

## 2023-10-26 NOTE — Telephone Encounter (Signed)
 Pt Wife (DPR) informed of providers result & recommendations. Pt verbalized understanding. All questions, if any, were answered.  She/Pt will call back and let us  know if pt would like this done or not. Will await call back.

## 2023-11-09 ENCOUNTER — Other Ambulatory Visit: Payer: Self-pay | Admitting: Family Medicine

## 2023-11-09 DIAGNOSIS — N401 Enlarged prostate with lower urinary tract symptoms: Secondary | ICD-10-CM

## 2023-11-09 DIAGNOSIS — N529 Male erectile dysfunction, unspecified: Secondary | ICD-10-CM

## 2023-11-11 ENCOUNTER — Other Ambulatory Visit: Payer: Self-pay | Admitting: Family Medicine

## 2023-11-11 DIAGNOSIS — N529 Male erectile dysfunction, unspecified: Secondary | ICD-10-CM

## 2023-11-11 DIAGNOSIS — N401 Enlarged prostate with lower urinary tract symptoms: Secondary | ICD-10-CM

## 2023-12-11 ENCOUNTER — Other Ambulatory Visit: Payer: Self-pay | Admitting: Family Medicine

## 2023-12-11 DIAGNOSIS — N401 Enlarged prostate with lower urinary tract symptoms: Secondary | ICD-10-CM

## 2023-12-11 DIAGNOSIS — N529 Male erectile dysfunction, unspecified: Secondary | ICD-10-CM

## 2023-12-12 ENCOUNTER — Other Ambulatory Visit: Payer: Self-pay | Admitting: Family Medicine

## 2023-12-12 DIAGNOSIS — N401 Enlarged prostate with lower urinary tract symptoms: Secondary | ICD-10-CM

## 2023-12-12 DIAGNOSIS — N529 Male erectile dysfunction, unspecified: Secondary | ICD-10-CM

## 2023-12-12 NOTE — Telephone Encounter (Signed)
 Copied from CRM [id number]. Topic: Clinical - Medication Refill >> Dec 12, 2023  8:40 AM Almira Coaster wrote: Most Recent Primary Care Visit:  Provider: [facility]  Department[facility]  Visit Type: LAB  Date: 08/29/2023  Medication: tadalafil (CIALIS) 5 MG tablet (90 day supply due to insurance)   Has the patient contacted their pharmacy? Yes (Agent: If no, request that the patient contact the pharmacy for the refill. If patient does not wish to contact the pharmacy document the reason why and proceed with request.) (Agent: If yes, when and what did the pharmacy advise?)  Is this the correct pharmacy for this prescription? Yes If no, delete pharmacy and type the correct one.  This is the patient's preferred pharmacy:    [id number] - [place], [place] - [address] [place] [address] Phone: [phone] Fax: [phone]   Has the prescription been filled recently? No  Is the patient out of the medication? Yes  Has the patient been seen for an appointment in the last year OR does the patient have an upcoming appointment? Yes  Can we respond through MyChart? No  Agent: Please be advised that Rx refills may take up to 3 business days. We ask that you follow-up with your pharmacy.

## 2024-01-10 ENCOUNTER — Other Ambulatory Visit: Payer: Self-pay | Admitting: Family Medicine

## 2024-01-10 DIAGNOSIS — N401 Enlarged prostate with lower urinary tract symptoms: Secondary | ICD-10-CM

## 2024-01-10 DIAGNOSIS — N529 Male erectile dysfunction, unspecified: Secondary | ICD-10-CM

## 2024-01-11 ENCOUNTER — Telehealth: Payer: Self-pay

## 2024-01-11 NOTE — Telephone Encounter (Signed)
 Pharmacy Patient Advocate Encounter  Received notification from [employer] that Prior Authorization for TADALAFIL  5MG  has been DENIED.  See denial reason below. No denial letter attached in CMM. Will attach denial letter to Media tab once received.  PA #/Case ID/Reference #: [id number]

## 2024-01-11 NOTE — Telephone Encounter (Signed)
 Pharmacy Patient Advocate Encounter   Received notification from CoverMyMeds that prior authorization for Tadalafil  5MG  tablets is required/requested.   Insurance verification completed.   The patient is insured through [employer] .   Per test claim: PA required; PA submitted to above mentioned insurance via CoverMyMeds Key/confirmation #/EOC [facility] Status is pending

## 2024-02-14 ENCOUNTER — Other Ambulatory Visit: Payer: Self-pay | Admitting: Family Medicine

## 2024-02-14 DIAGNOSIS — N529 Male erectile dysfunction, unspecified: Secondary | ICD-10-CM

## 2024-02-14 DIAGNOSIS — N401 Enlarged prostate with lower urinary tract symptoms: Secondary | ICD-10-CM

## 2024-02-14 NOTE — Telephone Encounter (Unsigned)
 Copied from CRM [id number]. Topic: Clinical - Medication Refill >> Feb 14, 2024 11:36 AM Jenice Mitts wrote: Medication:  tadalafil  (CIALIS ) 5 MG tablet 90 day refill because its cheaper   Has the patient contacted their pharmacy? Yes (Agent: If no, request that the patient contact the pharmacy for the refill. If patient does not wish to contact the pharmacy document the reason why and proceed with request.) (Agent: If yes, when and what did the pharmacy advise?)  This is the patient's preferred pharmacy:    [id number] - [place], [place] - [address] [place] [address] Phone: [phone] Fax: [phone]  Is this the correct pharmacy for this prescription? Yes If no, delete pharmacy and type the correct one.   Has the prescription been filled recently? Yes  Is the patient out of the medication? Yes  Has the patient been seen for an appointment in the last year OR does the patient have an upcoming appointment? Yes  Can we respond through MyChart? Yes  Agent: Please be advised that Rx refills may take up to 3 business days. We ask that you follow-up with your pharmacy.

## 2024-02-15 MED ORDER — TADALAFIL 5 MG PO TABS: 5.0000 mg | ORAL_TABLET | Freq: Every day | ORAL | 0 refills | Status: DC

## 2024-02-16 ENCOUNTER — Other Ambulatory Visit: Payer: Self-pay | Admitting: Family Medicine

## 2024-02-16 ENCOUNTER — Other Ambulatory Visit: Payer: Self-pay

## 2024-02-16 DIAGNOSIS — N401 Enlarged prostate with lower urinary tract symptoms: Secondary | ICD-10-CM

## 2024-02-16 DIAGNOSIS — N529 Male erectile dysfunction, unspecified: Secondary | ICD-10-CM

## 2024-02-16 MED ORDER — TADALAFIL 5 MG PO TABS
5.0000 mg | ORAL_TABLET | Freq: Every day | ORAL | 0 refills | Status: DC
Start: 2024-02-16 — End: 2024-05-22

## 2024-02-16 NOTE — Telephone Encounter (Signed)
 Copied from CRM [id number]. Topic: Clinical - Prescription Issue >> Feb 16, 2024 10:35 AM Albertha Alosa wrote: Reason for CRM: Patient called in stating that it was sent to the wrong pharmacy as the [facility] does not cover it , tadalafil  (CIALIS ) 5 MG tablet needs it sent to  [id number] - [place], [place] - [address] [place] [address] Phone: [phone] Fax: [phone]  Refill sent  and Pt was advised.

## 2024-02-20 ENCOUNTER — Other Ambulatory Visit (HOSPITAL_COMMUNITY): Payer: Self-pay

## 2024-02-21 ENCOUNTER — Ambulatory Visit (INDEPENDENT_AMBULATORY_CARE_PROVIDER_SITE_OTHER): Admitting: Family Medicine

## 2024-02-21 ENCOUNTER — Encounter: Payer: Self-pay | Admitting: Family Medicine

## 2024-02-21 VITALS — BP 128/64 | HR 96 | Temp 98.0°F | Resp 16 | Ht 67.0 in | Wt 211.0 lb

## 2024-02-21 DIAGNOSIS — L989 Disorder of the skin and subcutaneous tissue, unspecified: Secondary | ICD-10-CM

## 2024-02-21 DIAGNOSIS — Z79899 Other long term (current) drug therapy: Secondary | ICD-10-CM

## 2024-02-21 DIAGNOSIS — R351 Nocturia: Secondary | ICD-10-CM | POA: Diagnosis not present

## 2024-02-21 DIAGNOSIS — N401 Enlarged prostate with lower urinary tract symptoms: Secondary | ICD-10-CM

## 2024-02-21 DIAGNOSIS — G4700 Insomnia, unspecified: Secondary | ICD-10-CM

## 2024-02-21 MED ORDER — ZOLPIDEM TARTRATE 10 MG PO TABS: 10.0000 mg | ORAL_TABLET | Freq: Every evening | ORAL | 1 refills | Status: DC | PRN

## 2024-02-21 NOTE — Patient Instructions (Addendum)
 Keep the diet clean and stay active.  If you do not hear anything about your referral in the next 1-2 weeks, call our office and ask for an update.  Let us know if you need anything.

## 2024-02-21 NOTE — Progress Notes (Signed)
 Chief Complaint  Patient presents with   Follow-up    Follow Up    Subjective: Patient is a 78 y.o. male here for f/u.  Patient has a history of on the insomnia.  He is currently taking Ambien  10 mg nightly as needed.  He reports compliance and no adverse effects.  It is working quite well for him.  Patient has a history of BPH. Taking Cialis  5 mg/d. Compliant, no AE's. S/s's maintained/controlled. Having some hesitancy.   "Hole" on L side of neck over past 2 weeks. Clear liquid draining from it. No itching, bleeding, redness, pain, trauma. Hx of CEA.   Past Medical History:  Diagnosis Date   Acute myocardial infarction, unspecified site, episode of care unspecified 1985   CAD (coronary artery disease)    Cerebrovascular disease, unspecified    Coronary atherosclerosis of unspecified type of vessel, native or graft    GERD (gastroesophageal reflux disease)    Hemorrhoids    History of heart bypass surgery    MI (myocardial infarction) (HCC) 2010   Other and unspecified hyperlipidemia    Renal hematoma 2005   Wears dentures    full set   Wears glasses     Objective: BP 128/64 (BP Location: Right Arm, Patient Position: Sitting)   Pulse 96   Temp 98 F (36.7 C) (Oral)   Resp 16   Ht 5\' 7"  (1.702 m)   Wt 211 lb (95.7 kg)   SpO2 97%   BMI 33.05 kg/m  General: Awake, appears stated age Heart: RRR, no LE edema Lungs: CTAB, no rales, wheezes or rhonchi. No accessory muscle use Abd: BS+, S, NT, ND Skin: Open lesion on L lateral neck just under angle of mandible. Clear drainage noted. No purulence, foul odor, erythema, ttp.  Psych: Age appropriate judgment and insight, normal affect and mood  Assessment and Plan: Insomnia, unspecified type - Plan: zolpidem  (AMBIEN ) 10 MG tablet  Benign prostatic hyperplasia with nocturia  Lesion of neck - Plan: Ambulatory referral to ENT  High risk medication use - Plan: Drug Monitoring Panel [phone] , Urine  Chronic, stable. Cont  Ambien  10 mg qhs prn. CSC and UDS updated today.  Chronic, stable. Cont Cialis  5 mg/d.  Refer to ENT for their opinion.  Looks like a draining cyst or possibly a fistula. F/u in 6 mo or prn.  The patient voiced understanding and agreement to the plan.  Shellie Dials [place], DO 02/21/24  1:53 PM

## 2024-02-22 ENCOUNTER — Telehealth: Payer: Self-pay

## 2024-02-22 ENCOUNTER — Telehealth: Payer: Self-pay | Admitting: Cardiology

## 2024-02-22 DIAGNOSIS — I6523 Occlusion and stenosis of bilateral carotid arteries: Secondary | ICD-10-CM

## 2024-02-22 NOTE — Telephone Encounter (Signed)
 Wife called back and asked for you to call her on her mobile number [phone]

## 2024-02-22 NOTE — Telephone Encounter (Signed)
 Left message for pt, aware order has been placed and they will call him to schedule in the [facility].

## 2024-02-22 NOTE — Telephone Encounter (Signed)
 Wife is calling to speak with nurse to schedule the patient appt to have scan done of his neck. Please advise

## 2024-02-22 NOTE — Telephone Encounter (Signed)
 Called patient's wife (DPR), she stated that patient is wanting to go forward and schedule CTA of carotids now. Will forward to Dr. Lourdes Roy nurse.

## 2024-02-22 NOTE — Telephone Encounter (Signed)
 Copied from CRM [id number]. Topic: Referral - Question >> Feb 22, 2024  8:22 AM Ethan Estrada wrote: Reason for CRM: Patient's spouse Ethan Estrada is calling because the patient was referred to a specialist due to a lesion on his neck, Ethan Estrada stated that they did not want to schedule this as of yet as the patient's cardiologist was also handling this concern and they would like for him to give his recommendations before scheduling with a different specialist. She would also like to speak with Ethan Estrada's nurse. >> Feb 22, 2024  8:42 AM Ethan Estrada wrote: Wife called back in to give cell phone number, would like this number to be called first [phone]  Spoke with Pt wife and she stated his cardiology  Has ordered imaging and they are going to have it done. They will follow up with us  if need anything.

## 2024-02-23 LAB — DRUG MONITORING PANEL 376104, URINE
Amphetamines: NEGATIVE ng/mL (ref <500)
Barbiturates: NEGATIVE ng/mL (ref <300)
Benzodiazepines: NEGATIVE ng/mL (ref <100)
Cocaine Metabolite: NEGATIVE ng/mL (ref <150)
Desmethyltramadol: NEGATIVE ng/mL (ref <100)
Opiates: NEGATIVE ng/mL (ref <100)
Oxycodone: NEGATIVE ng/mL (ref <100)
Tramadol: NEGATIVE ng/mL (ref <100)

## 2024-02-23 LAB — DM TEMPLATE

## 2024-02-24 NOTE — Telephone Encounter (Signed)
I did not need this encounter. °

## 2024-02-27 ENCOUNTER — Other Ambulatory Visit: Payer: Self-pay | Admitting: Cardiology

## 2024-02-27 NOTE — Telephone Encounter (Signed)
 Patient called to thank RN Debra for her assistance in getting patient's scan ordered.

## 2024-02-29 ENCOUNTER — Telehealth: Payer: Self-pay | Admitting: Cardiology

## 2024-02-29 NOTE — Telephone Encounter (Signed)
 Wife ([name]) stated she is following up on the status of getting patient's CT ANGIO NECK W OR WO CONTRAST test scheduled.

## 2024-02-29 NOTE — Telephone Encounter (Signed)
 Spoke with pt wife, aware waiting for insurance response.

## 2024-03-02 NOTE — Telephone Encounter (Signed)
 [employer] called in asking about pt pa request for CT. Informed her a message has been sent to prior auth team and once approved they will let the pt know. She asked that I send another message to you all to inform that they are still waiting for pa request.   [place]: [phone]

## 2024-03-06 ENCOUNTER — Ambulatory Visit (INDEPENDENT_AMBULATORY_CARE_PROVIDER_SITE_OTHER)

## 2024-03-06 VITALS — Ht 67.0 in | Wt 211.0 lb

## 2024-03-06 DIAGNOSIS — Z0000 Encounter for general adult medical examination without abnormal findings: Secondary | ICD-10-CM | POA: Diagnosis not present

## 2024-03-06 NOTE — Patient Instructions (Addendum)
 Mr. Bartus , Thank you for taking time out of your busy schedule to complete your Annual Wellness Visit with me. I enjoyed our conversation and look forward to speaking with you again next year. I, as well as your care team,  appreciate your ongoing commitment to your health goals. Please review the following plan we discussed and let me know if I can assist you in the future. Your Game plan/ To Do List    Referrals: If you haven't heard from the office you've been referred to, please reach out to them at the phone provided.   Follow up Visits: Next Medicare AWV with our clinical staff: 03/12/24 @ 2:30p   Have you seen your provider in the last 6 months (3 months if uncontrolled diabetes)?  Next Office Visit with your provider: 08/24/24 @ 1:30p  Clinician Recommendations:  Aim for 30 minutes of exercise or brisk walking, 6-8 glasses of water, and 5 servings of fruits and vegetables each day.       This is a list of the screening recommended for you and due dates:  Health Maintenance  Topic Date Due   DTaP/Tdap/Td vaccine (1 - Tdap) Never done   Zoster (Shingles) Vaccine (1 of 2) Never done   COVID-19 Vaccine (3 - Pfizer risk series) 03/25/2020   Screening for Lung Cancer  12/23/2021   Flu Shot  04/20/2024   Medicare Annual Wellness Visit  03/06/2025   Pneumococcal Vaccine for age over 40  Completed   HPV Vaccine  Aged Out   Meningitis B Vaccine  Aged Out   Colon Cancer Screening  Discontinued   Hepatitis C Screening  Discontinued    Advanced directives: (In Chart) A copy of your advanced directives are scanned into your chart should your provider ever need it. Advance Care Planning is important because it:  [x]  Makes sure you receive the medical care that is consistent with your values, goals, and preferences  [x]  It provides guidance to your family and loved ones and reduces their decisional burden about whether or not they are making the right decisions based on your  wishes.  Follow the link provided in your after visit summary or read over the paperwork we have mailed to you to help you started getting your Advance Directives in place. If you need assistance in completing these, please reach out to us  so that we can help you!  See attachments for Preventive Care and Fall Prevention Tips.

## 2024-03-06 NOTE — Progress Notes (Signed)
 Subjective:   Ethan Estrada is a 78 y.o. who presents for a Medicare Wellness preventive visit.  As a reminder, Annual Wellness Visits don't include a physical exam, and some assessments may be limited, especially if this visit is performed virtually. We may recommend an in-person follow-up visit with your provider if needed.  Visit Complete: Virtual I connected with  Andra Banco on 03/06/24 by a audio enabled telemedicine application and verified that I am speaking with the correct person using two identifiers.  Patient Location: Home  Provider Location: Home Office  I discussed the limitations of evaluation and management by telemedicine. The patient expressed understanding and agreed to proceed.  Vital Signs: Because this visit was a virtual/telehealth visit, some criteria may be missing or patient reported. Any vitals not documented were not able to be obtained and vitals that have been documented are patient reported.    Persons Participating in Visit: Patient.  AWV Questionnaire: No: Patient Medicare AWV questionnaire was not completed prior to this visit.  Cardiac Risk Factors include: advanced age (>74men, >66 women);male gender     Objective:    Today's Vitals   03/06/24 1434  Weight: 211 lb (95.7 kg)  Height: 5' 7 (1.702 m)   Body mass index is 33.05 kg/m.     03/06/2024    2:41 PM 01/26/2023    8:55 AM 01/22/2022    8:56 AM 02/12/2021   10:53 AM 12/18/2020    2:54 PM 12/07/2019    2:37 PM 08/28/2019    3:05 PM  Advanced Directives  Does Patient Have a Medical Advance Directive? Yes Yes Yes Yes Yes Yes Yes  Type of Estate agent of [place];Living will Healthcare Power of [place];Living will Healthcare Power of [place];Living will Healthcare Power of [place];Living will Healthcare Power of [place];Living will Healthcare Power of [place];Living will Healthcare Power of [place];Living will  Does patient want to make changes to medical  advance directive? No - Patient declined   No - Guardian declined  No - Patient declined No - Patient declined  Copy of Healthcare Power of Attorney in Chart? Yes - validated most recent copy scanned in chart (See row information) Yes - validated most recent copy scanned in chart (See row information) No - copy requested No - copy requested No - copy requested No - copy requested     Current Medications (verified) Outpatient Encounter Medications as of 03/06/2024  Medication Sig   aspirin  81 MG tablet Take 1 tablet (81 mg total) by mouth daily.   atorvastatin  (LIPITOR ) 80 MG tablet TAKE 1 TABLET EVERY DAY AT 6PM (PLEASE KEEP JANUARY CARDIOLOGY APPOINTMENT FOR REFILLS)   ezetimibe  (ZETIA ) 10 MG tablet TAKE 1 TABLET EVERY DAY   furosemide  (LASIX ) 40 MG tablet Take 1 tablet (40 mg total) by mouth daily.   metoprolol  tartrate (LOPRESSOR ) 50 MG tablet TAKE 1 TABLET TWICE DAILY   omeprazole (PRILOSEC) 10 MG capsule Take 10 mg by mouth daily.    tadalafil  (CIALIS ) 5 MG tablet Take 1 tablet (5 mg total) by mouth daily.   triamcinolone  cream (KENALOG ) 0.1 % Apply 1 Application topically 2 (two) times daily.   zolpidem  (AMBIEN ) 10 MG tablet Take 1 tablet (10 mg total) by mouth at bedtime as needed for sleep.   No facility-administered encounter medications on file as of 03/06/2024.    Allergies (verified) Patient has no known allergies.   History: Past Medical History:  Diagnosis Date   Acute myocardial infarction, unspecified site, episode of  care unspecified 1985   CAD (coronary artery disease)    Cerebrovascular disease, unspecified    Coronary atherosclerosis of unspecified type of vessel, native or graft    GERD (gastroesophageal reflux disease)    Hemorrhoids    History of heart bypass surgery    MI (myocardial infarction) (HCC) 2010   Other and unspecified hyperlipidemia    Renal hematoma 2005   Wears dentures    full set   Wears glasses    Past Surgical History:  Procedure  Laterality Date   COLONOSCOPY WITH ESOPHAGOGASTRODUODENOSCOPY (EGD)  yrs ago due 2022   CORONARY ARTERY BYPASS GRAFT  x 3   S/P in February of 2010. Procedure performed in [place]   ENDARTERECTOMY Left 10/12/2018   Procedure: ENDARTERECTOMY CAROTID LEFT;  Surgeon: Mayo Speck, MD;  Location: [facility] OR;  Service: Vascular;  Laterality: Left;   EYE SURGERY Left    cataracts   HEMORRHOID SURGERY     HEMORRHOID SURGERY N/A 02/12/2021   Procedure: HEMORRHOIDECTOMY SINGLE COLUMN;  Surgeon: Joyce Nixon, MD;  Location: [facility];  Service: General;  Laterality: N/A;   TONSILLECTOMY  as child   Family History  Problem Relation Age of Onset   Cancer Mother 34       Leukemia   Coronary artery disease Father 12       MI   Social History   Socioeconomic History   Marital status: Married    Spouse name: Not on file   Number of children: Not on file   Years of education: Not on file   Highest education level: Not on file  Occupational History   Occupation: Full Time  Tobacco Use   Smoking status: Every Day    Current packs/day: 0.00    Average packs/day: 2.0 packs/day for 50.0 years (100.0 ttl pk-yrs)    Types: Cigars, Cigarettes    Start date: 10/21/1958    Last attempt to quit: 10/21/2008    Years since quitting: 15.3   Smokeless tobacco: Never   Tobacco comments:    Now smoking cigars 2 cigares per day  Vaping Use   Vaping status: Never Used  Substance and Sexual Activity   Alcohol use: Yes    Comment: occasionally   Drug use: No   Sexual activity: Not on file  Other Topics Concern   Not on file  Social History Narrative   No Regular Exercise   Social Drivers of Health   Financial Resource Strain: Low Risk  (03/06/2024)   Overall Financial Resource Strain (CARDIA)    Difficulty of Paying Living Expenses: Not hard at all  Food Insecurity: No Food Insecurity (03/06/2024)   Hunger Vital Sign    Worried About Running Out of Food in the Last Year: Never true     Ran Out of Food in the Last Year: Never true  Transportation Needs: No Transportation Needs (03/06/2024)   PRAPARE - Administrator, Civil Service (Medical): No    Lack of Transportation (Non-Medical): No  Physical Activity: Inactive (03/06/2024)   Exercise Vital Sign    Days of Exercise per Week: 0 days    Minutes of Exercise per Session: 0 min  Stress: No Stress Concern Present (03/06/2024)   [employer] of Occupational Health - Occupational Stress Questionnaire    Feeling of Stress: Not at all  Social Connections: Socially Integrated (03/06/2024)   Social Connection and Isolation Panel    Frequency of Communication with Friends and Family:  More than three times a week    Frequency of Social Gatherings with Friends and Family: More than three times a week    Attends Religious Services: More than 4 times per year    Active Member of Clubs or Organizations: Yes    Attends Engineer, structural: More than 4 times per year    Marital Status: Married    Tobacco Counseling Ready to quit: No Counseling given: Yes Tobacco comments: Now smoking cigars 2 cigares per day    Clinical Intake:  Pre-visit preparation completed: Yes  Pain : No/denies pain     BMI - recorded: 33.05 Nutritional Status: BMI > 30  Obese Nutritional Risks: None Diabetes: No  No results found for: HGBA1C   How often do you need to have someone help you when you read instructions, pamphlets, or other written materials from your doctor or pharmacy?: 1 - Never  Interpreter Needed?: No  Information entered by :: Farris Hong LPN   Activities of Daily Living     03/06/2024    2:40 PM  In your present state of health, do you have any difficulty performing the following activities:  Hearing? 0  Vision? 0  Difficulty concentrating or making decisions? 0  Walking or climbing stairs? 0  Dressing or bathing? 0  Doing errands, shopping? 0  Preparing Food and eating ? N   Using the Toilet? N  In the past six months, have you accidently leaked urine? N  Do you have problems with loss of bowel control? N  Managing your Medications? N  Managing your Finances? N  Housekeeping or managing your Housekeeping? N    Patient Care Team: Jobe Mulder, DO as PCP - General (Family Medicine) Audery Blazing Deannie Fabian, MD as PCP - Cardiology (Cardiology)   I have updated your Care Teams any recent Medical Services you may have received from other providers in the past year.     Assessment:   This is a routine wellness examination for [place].  Hearing/Vision screen Hearing Screening - Comments:: Denies hearing difficulties   Vision Screening - Comments:: Wears rx glasses - up to date with routine eye exams with  Dr Luster Salters   Goals Addressed               This Visit's Progress     Increase physical activity (pt-stated)        Get more active.       Depression Screen      03/06/2024    2:39 PM 08/16/2023    3:29 PM 01/26/2023    8:59 AM 10/01/2022    3:30 PM 03/31/2022    3:36 PM 01/22/2022    8:53 AM 12/18/2020    2:56 PM  PHQ 2/9 Scores  PHQ - 2 Score 0 0 0 0 0 0 0    Fall Risk      03/06/2024    2:41 PM 08/16/2023    3:29 PM 01/26/2023    8:57 AM 10/01/2022    3:30 PM 03/31/2022    3:36 PM  Fall Risk   Falls in the past year? 0 0 0 0 1  Number falls in past yr: 0 0 0 0 0  Injury with Fall? 0 0 0 0 0  Risk for fall due to : No Fall Risks  No Fall Risks    Follow up Falls evaluation completed Falls evaluation completed Falls evaluation completed Falls evaluation completed       Data saved  with a previous flowsheet row definition    MEDICARE RISK AT HOME:   Medicare Risk at Home Any stairs in or around the home?: Yes If so, are there any without handrails?: No Home free of loose throw rugs in walkways, pet beds, electrical cords, etc?: Yes Adequate lighting in your home to reduce risk of falls?: Yes Life alert?: No Use of a cane, walker  or w/c?: No Grab bars in the bathroom?: No Shower chair or bench in shower?: No Elevated toilet seat or a handicapped toilet?: No  TIMED UP AND GO:  Was the test performed?  No  Cognitive Function: 6CIT completed        03/06/2024    2:41 PM 01/26/2023    9:02 AM 01/22/2022    8:59 AM  6CIT Screen  What Year? 0 points 0 points 0 points  What month? 0 points 0 points 0 points  What time? 0 points 0 points 0 points  Count back from 20 0 points 0 points 0 points  Months in reverse 0 points 0 points 0 points  Repeat phrase 0 points 6 points 0 points  Total Score 0 points 6 points 0 points    Immunizations Immunization History  Administered Date(s) Administered   Fluad Quad(high Dose 65+) 10/01/2022   Fluad Trivalent(High Dose 65+) 08/16/2023   Influenza, High Dose Seasonal PF 06/29/2018, 07/04/2019   PFIZER Comirnaty(Gray Top)Covid-19 Tri-Sucrose Vaccine 01/29/2020, 02/26/2020   PNEUMOCOCCAL CONJUGATE-20 03/31/2022   Pneumococcal Polysaccharide-23 12/11/2019    Screening Tests Health Maintenance  Topic Date Due   DTaP/Tdap/Td (1 - Tdap) Never done   Zoster Vaccines- Shingrix (1 of 2) Never done   COVID-19 Vaccine (3 - Pfizer risk series) 03/25/2020   Lung Cancer Screening  12/23/2021   INFLUENZA VACCINE  04/20/2024   Medicare Annual Wellness (AWV)  03/06/2025   Pneumococcal Vaccine: 50+ Years  Completed   HPV VACCINES  Aged Out   Meningococcal B Vaccine  Aged Out   Colonoscopy  Discontinued   Hepatitis C Screening  Discontinued    Health Maintenance  Health Maintenance Due  Topic Date Due   DTaP/Tdap/Td (1 - Tdap) Never done   Zoster Vaccines- Shingrix (1 of 2) Never done   COVID-19 Vaccine (3 - Pfizer risk series) 03/25/2020   Lung Cancer Screening  12/23/2021   Health Maintenance Items Addressed: Patient declined Lung Cancer Screening. Vaccines deferred  Additional Screening:  Vision Screening: Recommended annual ophthalmology exams for early detection of  glaucoma and other disorders of the eye. Would you like a referral to an eye doctor? No    Dental Screening: Recommended annual dental exams for proper oral hygiene  Community Resource Referral / Chronic Care Management: CRR required this visit?  No   CCM required this visit?  No   Plan:    I have personally reviewed and noted the following in the patient's chart:   Medical and social history Use of alcohol, tobacco or illicit drugs  Current medications and supplements including opioid prescriptions. Patient is not currently taking opioid prescriptions. Functional ability and status Nutritional status Physical activity Advanced directives List of other physicians Hospitalizations, surgeries, and ER visits in previous 12 months Vitals Screenings to include cognitive, depression, and falls Referrals and appointments  In addition, I have reviewed and discussed with patient certain preventive protocols, quality metrics, and best practice recommendations. A written personalized care plan for preventive services as well as general preventive health recommendations were provided to patient.   Alvy Baar  Lex Redbird, LPN   1/61/0960   After Visit Summary: (MyChart) Due to this being a telephonic visit, the after visit summary with patients personalized plan was offered to patient via MyChart   Notes: Nothing significant to report at this time.

## 2024-03-12 ENCOUNTER — Ambulatory Visit (HOSPITAL_BASED_OUTPATIENT_CLINIC_OR_DEPARTMENT_OTHER)
Admission: RE | Admit: 2024-03-12 | Discharge: 2024-03-12 | Disposition: A | Discharge status: Home / Self Care | Source: Ambulatory Visit | Attending: Cardiology | Admitting: Cardiology

## 2024-03-12 ENCOUNTER — Encounter (HOSPITAL_BASED_OUTPATIENT_CLINIC_OR_DEPARTMENT_OTHER): Payer: Self-pay

## 2024-03-12 DIAGNOSIS — I672 Cerebral atherosclerosis: Secondary | ICD-10-CM | POA: Diagnosis not present

## 2024-03-12 DIAGNOSIS — J322 Chronic ethmoidal sinusitis: Secondary | ICD-10-CM | POA: Diagnosis not present

## 2024-03-12 DIAGNOSIS — I6523 Occlusion and stenosis of bilateral carotid arteries: Secondary | ICD-10-CM | POA: Diagnosis not present

## 2024-03-12 DIAGNOSIS — I700 Atherosclerosis of aorta: Secondary | ICD-10-CM | POA: Diagnosis not present

## 2024-03-12 MED ORDER — IOHEXOL 350 MG/ML SOLN
100.0000 mL | Freq: Once | INTRAVENOUS | Status: AC | PRN
  Administered 2024-03-12: 75 mL via INTRAVENOUS

## 2024-03-14 ENCOUNTER — Encounter (INDEPENDENT_AMBULATORY_CARE_PROVIDER_SITE_OTHER): Payer: Self-pay

## 2024-03-14 ENCOUNTER — Telehealth: Payer: Self-pay | Admitting: Cardiology

## 2024-03-14 NOTE — Telephone Encounter (Signed)
 Pt wife would like a c.b from the nurse to go over results. Please advise

## 2024-03-14 NOTE — Telephone Encounter (Signed)
 Spoke with pt wife, she reports they are not able to use my chart. Aware as soon as the CT is read, I will give them a call.

## 2024-03-15 ENCOUNTER — Telehealth: Payer: Self-pay | Admitting: Cardiology

## 2024-03-15 NOTE — Telephone Encounter (Signed)
 Spoke with pt wife, questions regarding CT scan answered.

## 2024-03-15 NOTE — Telephone Encounter (Signed)
 aNew Message:        Please have Adrien call patient's wife. She wants to talk to her about some paperwork please.

## 2024-03-23 ENCOUNTER — Ambulatory Visit: Payer: Self-pay | Admitting: Cardiology

## 2024-03-26 ENCOUNTER — Telehealth: Payer: Self-pay | Admitting: Cardiology

## 2024-03-26 NOTE — Telephone Encounter (Signed)
 Spoke with pt wife, questions regarding recent CT scan answered. Results forwarded to PCP per their request.

## 2024-03-26 NOTE — Telephone Encounter (Signed)
 Wife is calling to get CT results. They cannot get into mychart

## 2024-03-28 ENCOUNTER — Telehealth: Payer: Self-pay

## 2024-03-28 NOTE — Telephone Encounter (Signed)
 Copied from CRM [id number]. Topic: Referral - Question >> Feb 22, 2024  8:22 AM Franky GRADE wrote: Reason for CRM: Patient's spouse Barney is calling because the patient was referred to a specialist due to a lesion on his neck, [name] stated that they did not want to schedule this as of yet as the patient's cardiologist was also handling this concern and they would like for him to give his recommendations before scheduling with a different specialist. She would also like to speak with Dr.Wendling's nurse. >> Mar 28, 2024 11:53 AM Rosina BIRCH wrote: Patient wife Barney called stating she have questions to ask about a scan CB [phone] >> Feb 22, 2024  8:42 AM Thersia BROCKS wrote: Wife called back in to give cell phone number, would like this number to be called first [phone]  Called pt was advised referral to ENT was done, Advised him to reach out to them to move forward  With the appt with ENT. Pt stated understand.

## 2024-03-29 ENCOUNTER — Telehealth: Payer: Self-pay | Admitting: Family Medicine

## 2024-03-29 DIAGNOSIS — I779 Disorder of arteries and arterioles, unspecified: Secondary | ICD-10-CM

## 2024-03-29 NOTE — Addendum Note (Signed)
 Addended by: Katalyna Socarras D on: 03/29/2024 02:17 PM   Modules accepted: Orders

## 2024-03-29 NOTE — Telephone Encounter (Signed)
 Copied from CRM [id number]. Topic: Referral - Request for Referral >> Mar 29, 2024  9:52 AM Mesmerise C wrote: Did the patient discuss referral with their provider in the last year? Yes (If No - schedule appointment) (If Yes - send message)  Appointment offered? No  Type of order/referral and detailed reason for visit: Vascular Surgeon  Preference of office, provider, location: Dr.Todd Early [address]  If referral order, have you been seen by this specialty before? Yes (If Yes, this issue or another issue? When? Where? Had his carotid artery surgery by him in 2020  Can we respond through MyChart? No  Would like a call back at [phone] or [phone]

## 2024-03-29 NOTE — Telephone Encounter (Signed)
 Yes- to re-establish.

## 2024-03-29 NOTE — Telephone Encounter (Signed)
 Just to re-establish? Dr. Pietro noted the carotids are open.

## 2024-03-29 NOTE — Telephone Encounter (Signed)
**Note De-identified  [place] Obfuscation** Please advise 

## 2024-03-31 ENCOUNTER — Other Ambulatory Visit: Payer: Self-pay | Admitting: Cardiology

## 2024-03-31 DIAGNOSIS — E7800 Pure hypercholesterolemia, unspecified: Secondary | ICD-10-CM

## 2024-05-09 ENCOUNTER — Other Ambulatory Visit (HOSPITAL_COMMUNITY): Payer: Self-pay | Admitting: Vascular Surgery

## 2024-05-09 ENCOUNTER — Ambulatory Visit: Admitting: Vascular Surgery

## 2024-05-09 ENCOUNTER — Encounter: Payer: Self-pay | Admitting: Vascular Surgery

## 2024-05-09 ENCOUNTER — Other Ambulatory Visit: Payer: Self-pay

## 2024-05-09 ENCOUNTER — Ambulatory Visit
Admission: RE | Admit: 2024-05-09 | Discharge: 2024-05-09 | Disposition: A | Discharge status: Home / Self Care | Source: Ambulatory Visit | Attending: Vascular Surgery | Admitting: Vascular Surgery

## 2024-05-09 VITALS — BP 156/77 | HR 68 | Temp 97.7°F | Resp 20 | Ht 67.0 in | Wt 212.3 lb

## 2024-05-09 DIAGNOSIS — I6522 Occlusion and stenosis of left carotid artery: Secondary | ICD-10-CM

## 2024-05-09 DIAGNOSIS — Z01818 Encounter for other preprocedural examination: Secondary | ICD-10-CM

## 2024-05-09 NOTE — Progress Notes (Signed)
 Patient ID: Ethan Estrada, male   DOB: [birth year], 78 y.o.   MRN: [id number]  Reason for Consult: New Patient (Initial Visit)   Referred by Ethan Mabel Mt*  Subjective:     HPI:  Ethan Estrada is a 78 y.o. male with history of left carotid endarterectomy for asymptomatic these performed with dacron patch angioplasty.  He initially noted swelling in his left neck a couple months ago and underwent duplex.  For the past month or so now he has had drainage from the left neck.  He denies any fevers or chills.  He states that this was not present until just a couple months ago.  No previous radiation to his neck no other neck surgeries were performed.  He is followed by Dr. Pietro with cardiology remains on aspirin  and a statin but no blood thinners.  He continues to smoke cigars daily.  No history of stroke, TIA or amaurosis.  Past Medical History:  Diagnosis Date   Acute myocardial infarction, unspecified site, episode of care unspecified 1985   CAD (coronary artery disease)    Carotid artery occlusion    Cerebrovascular disease, unspecified    Coronary atherosclerosis of unspecified type of vessel, native or graft    GERD (gastroesophageal reflux disease)    Hemorrhoids    History of heart bypass surgery    MI (myocardial infarction) (HCC) 2010   Other and unspecified hyperlipidemia    Renal hematoma 2005   Wears dentures    full set   Wears glasses    Family History  Problem Relation Age of Onset   Cancer Mother 62       Leukemia   Coronary artery disease Father 40       MI   Past Surgical History:  Procedure Laterality Date   COLONOSCOPY WITH ESOPHAGOGASTRODUODENOSCOPY (EGD)  yrs ago due 2022   CORONARY ARTERY BYPASS GRAFT  x 3   S/P in February of 2010. Procedure performed in [place]   ENDARTERECTOMY Left 10/12/2018   Procedure: ENDARTERECTOMY CAROTID LEFT;  Surgeon: Oris Krystal FALCON, MD;  Location: [facility] OR;  Service: Vascular;  Laterality: Left;   EYE SURGERY  Left    cataracts   HEMORRHOID SURGERY     HEMORRHOID SURGERY N/A 02/12/2021   Procedure: HEMORRHOIDECTOMY SINGLE COLUMN;  Surgeon: Debby Hila, MD;  Location: [facility];  Service: General;  Laterality: N/A;   TONSILLECTOMY  as child    Short Social History:  Social History   Tobacco Use   Smoking status: Every Day    Current packs/day: 0.00    Average packs/day: 2.0 packs/day for 50.0 years (100.0 ttl pk-yrs)    Types: Cigars, Cigarettes    Start date: 10/21/1958    Last attempt to quit: 10/21/2008    Years since quitting: 15.5   Smokeless tobacco: Never   Tobacco comments:    Now smoking cigars 2 cigares per day  Substance Use Topics   Alcohol use: Yes    Comment: occasionally    No Known Allergies  Current Outpatient Medications  Medication Sig Dispense Refill   aspirin  81 MG tablet Take 1 tablet (81 mg total) by mouth daily.     atorvastatin  (LIPITOR ) 80 MG tablet TAKE 1 TABLET EVERY DAY AT 6PM (PLEASE KEEP JANUARY CARDIOLOGY APPOINTMENT FOR REFILLS) 90 tablet 3   ezetimibe  (ZETIA ) 10 MG tablet TAKE 1 TABLET EVERY DAY 90 tablet 1   furosemide  (LASIX ) 40 MG tablet Take 1 tablet (40 mg total)  by mouth daily. 30 tablet 3   metoprolol  tartrate (LOPRESSOR ) 50 MG tablet TAKE 1 TABLET TWICE DAILY 180 tablet 3   omeprazole (PRILOSEC) 10 MG capsule Take 10 mg by mouth daily.      tadalafil  (CIALIS ) 5 MG tablet Take 1 tablet (5 mg total) by mouth daily. 90 tablet 0   triamcinolone  cream (KENALOG ) 0.1 % Apply 1 Application topically 2 (two) times daily. 30 g 2   zolpidem  (AMBIEN ) 10 MG tablet Take 1 tablet (10 mg total) by mouth at bedtime as needed for sleep. 90 tablet 1   No current facility-administered medications for this visit.    Review of Systems  Constitutional:  Constitutional negative. HENT:       Drainage from left neck Eyes: Eyes negative.  Respiratory: Respiratory negative.  Cardiovascular: Cardiovascular negative.  GI: Gastrointestinal  negative.  Musculoskeletal: Musculoskeletal negative.  Skin: Skin negative.  Neurological: Neurological negative. Hematologic: Hematologic/lymphatic negative.  Psychiatric: Psychiatric negative.        Objective:  Objective   Vitals:   05/09/24 0833 05/09/24 0836  BP: (!) 161/78 (!) 156/77  Pulse: 68   Resp: 20   Temp: 97.7 F (36.5 C)   TempSrc: Temporal   SpO2: 98%   Weight: 212 lb 4.8 oz (96.3 kg)   Height: 5' 7 (1.702 m)    Body mass index is 33.25 kg/m.  Physical Exam HENT:     Head: Normocephalic.     Nose: Nose normal.  Eyes:     Pupils: Pupils are equal, round, and reactive to light.  Neck:     Vascular: No carotid bruit.     Comments: Draining sinus left neck as pictured below Cardiovascular:     Rate and Rhythm: Normal rate.  Pulmonary:     Effort: Pulmonary effort is normal.  Abdominal:     General: Abdomen is flat.  Musculoskeletal:        General: Normal range of motion.     Right lower leg: Edema present.     Left lower leg: Edema present.  Skin:    General: Skin is warm.     Capillary Refill: Capillary refill takes less than 2 seconds.  Neurological:     Mental Status: He is alert.     Data: BILATERAL CAROTID DUPLEX ULTRASOUND   TECHNIQUE: Elnor scale imaging, color Doppler and duplex ultrasound were performed of bilateral carotid and vertebral arteries in the neck.   COMPARISON:  06/10/2022; 08/06/2016   FINDINGS: Criteria: Quantification of carotid stenosis is based on velocity parameters that correlate the residual internal carotid diameter with NASCET-based stenosis levels, using the diameter of the distal internal carotid lumen as the denominator for stenosis measurement.   The following velocity measurements were obtained:   RIGHT   ICA: 85/25 cm/sec   CCA: 86/16 cm/sec   SYSTOLIC ICA/CCA RATIO:  1.0   ECA: 92 cm/sec   LEFT   ICA: 127/31 cm/sec   CCA: 64/15 cm/sec   SYSTOLIC ICA/CCA RATIO:  2.0   ECA: 31  cm/sec   RIGHT CAROTID ARTERY: There is a moderate amount of eccentric echogenic shadowing plaque within the right carotid bulb (image 11 and 28), extending to involve the origin and proximal aspects of the right internal carotid artery (image 31), not resulting in elevated peak systolic velocities within the interrogated course of the right internal carotid artery to suggest a hemodynamically significant stenosis.   RIGHT VERTEBRAL ARTERY:  Antegrade flow   LEFT CAROTID ARTERY: Stable postoperative  change of the left carotid bulb (image 72). There is a minimal amount of eccentric echogenic plaque/intimal wall thickening involving the origin and proximal aspects of the left internal carotid artery (image 75), which results in borderline elevated peak systolic velocities within the mid aspect of the left internal carotid artery, potentially factitiously elevated due to sampling at a location of turbulent flow. Greatest acquired peak systolic velocity within mid left ICA measures 127 centimeters/second (image 80), previously, greatest acquired peak systolic velocity within the proximal left ICA measured 237 centimeters/second.   LEFT VERTEBRAL ARTERY:  Antegrade flow   Sonographic evaluation of patient's palpable abnormality inferior to the left ear correlates with a serpiginous hypoechoic area adjacent to the prior operative site (images 86 through 95). No blood flow is demonstrated within this serpiginous palpable area though it does appear to extend to abut the left internal carotid artery.   IMPRESSION: 1. Stable postoperative change of the left carotid bulb with a minimal amount of residual/recurrent atherosclerotic plaque/intimal wall thickening resulting in borderline elevated peak systolic velocities within the mid aspect of the left internal carotid artery, compatible with the 50-69% luminal narrowing range though velocity measurements are reduced when compared to the  05/2022 examination. Further evaluation with CTA could be performed as indicated. 2. Sonographic evaluation of patient's palpable abnormality inferior to the left ear correlates with a serpiginous hypoechoic area adjacent to the prior CEA operative site. No blood flow is demonstrated within this serpiginous palpable area, though it does appear to extend to abut the left internal carotid artery. Findings are nonspecific and while potentially representative of postoperative scarring was not seen on the 05/2022 examination. Again, this could be further evaluated with CTA as indicated. 3. Moderate amount of right-sided atherosclerotic plaque, not resulting in a hemodynamically significant stenosis.  CT ANGIOGRAPHY NECK   TECHNIQUE: Multidetector CT imaging of the neck was performed using the standard protocol during bolus administration of intravenous contrast. Multiplanar CT image reconstructions and MIPs were obtained to evaluate the vascular anatomy. Carotid stenosis measurements (when applicable) are obtained utilizing NASCET criteria, using the distal internal carotid diameter as the denominator.   RADIATION DOSE REDUCTION: This exam was performed according to the departmental dose-optimization program which includes automated exposure control, adjustment of the mA and/or kV according to patient size and/or use of iterative reconstruction technique.   CONTRAST:  75mL OMNIPAQUE  IOHEXOL  350 MG/ML SOLN   COMPARISON:  Carotid artery duplex 10/24/2023.   FINDINGS: Aortic arch: Standard aortic branching. Atherosclerotic plaque within the visible thoracic aorta and proximal major branch vessels of the neck. No hemodynamically significant innominate or proximal subclavian artery stenosis.   Right carotid system: CCA and ICA patent within the neck. Atherosclerotic plaque, greatest about the carotid bifurcation and within the proximal ICA. 30% stenosis at the ICA  origin. Non-stenotic calcified plaque within the intracranial ICA.   Left carotid system: CCA and ICA patent within the neck without stenosis. Prior carotid endarterectomy. 16 x 4 mm broad-based vascular protrusion projecting laterally from the distal common carotid artery, likely reflecting postoperative vessel wall irregularity (series 601, image 154). Non-stenotic atherosclerotic plaque within the intracranial ICA.   Vertebral arteries: Codominant and patent within the neck. Non-stenotic calcified plaque within the left vertebral artery V1 and V2 segments.   Skeleton: Cervical spondylosis. Disc space narrowing is advanced at C5-C6. 3 mm C4-C5 grade 1 anterolisthesis. The patient is edentulous.   Other neck: No neck mass or cervical lymphadenopathy.   Upper chest: No consolidation within the imaged  lung apices. Prior median sternotomy.   Other: 8 mm mucous retention cyst or polyp within the right maxillary sinus. Small volume frothy secretions within the right sphenoid sinus. Mild left ethmoid sinusitis.   IMPRESSION: 1. The right common and internal carotid arteries are patent within the neck. Moderate atherosclerotic plaque about the right carotid bifurcation and within the proximal right ICA (with 30% stenosis at the ICA origin). 2. The left common and internal carotid arteries are patent within the neck. Prior left carotid endarterectomy. 16 x 4 mm broad-based vascular protrusion projecting laterally from the distal common carotid artery, likely reflecting postoperative vessel wall irregularity. 3. Vertebral arteries patent within the neck. Non-stenotic atherosclerotic plaque within the left vertebral artery V1 and V2 segments. 4. Aortic Atherosclerosis (ICD10-I70.0).     Assessment/Plan:     78 year old male with history of left carotid endarterectomy for severe asymptomatic left ICA stenosis performed over 5 years ago.  He now has several month history of swelling  followed by drainage from the left neck we reviewed CT scan together today which appears to be a draining sinus from the patch.  I have discussed the risk of leaving patch infection and possible blowout if untreated.  I discussed that the patient would require vein mapping with replacement of dacron patch with vein if available.  I did discuss that this comes at high risk of nerve injury than standard carotid endarterectomy due to the scar tissue and likely inflammatory reaction around the patch.  We discussed the risk of stroke which is likely around 5% as well.  I quoted nerve injury between 10 and 15%.  We discussed that nerve injury could require feeding tube possibly without a voice and that stroke could lead him incapacitated.  He demonstrates good understanding of this.  We also discussed that smoking can contribute to nonhealing wounds.  We will have vein mapping performed today and schedule surgery in the next 1 to 2 weeks.     Penne Lonni [place] MD [facility]

## 2024-05-09 NOTE — H&P (View-Only) (Signed)
 Patient ID: Ethan Estrada, male   DOB: [birth year], 78 y.o.   MRN: [id number]  Reason for Consult: New Patient (Initial Visit)   Referred by Frann Mabel Mt*  Subjective:     HPI:  Ethan Estrada is a 78 y.o. male with history of left carotid endarterectomy for asymptomatic these performed with dacron patch angioplasty.  He initially noted swelling in his left neck a couple months ago and underwent duplex.  For the past month or so now he has had drainage from the left neck.  He denies any fevers or chills.  He states that this was not present until just a couple months ago.  No previous radiation to his neck no other neck surgeries were performed.  He is followed by Dr. Pietro with cardiology remains on aspirin  and a statin but no blood thinners.  He continues to smoke cigars daily.  No history of stroke, TIA or amaurosis.  Past Medical History:  Diagnosis Date   Acute myocardial infarction, unspecified site, episode of care unspecified 1985   CAD (coronary artery disease)    Carotid artery occlusion    Cerebrovascular disease, unspecified    Coronary atherosclerosis of unspecified type of vessel, native or graft    GERD (gastroesophageal reflux disease)    Hemorrhoids    History of heart bypass surgery    MI (myocardial infarction) (HCC) 2010   Other and unspecified hyperlipidemia    Renal hematoma 2005   Wears dentures    full set   Wears glasses    Family History  Problem Relation Age of Onset   Cancer Mother 62       Leukemia   Coronary artery disease Father 40       MI   Past Surgical History:  Procedure Laterality Date   COLONOSCOPY WITH ESOPHAGOGASTRODUODENOSCOPY (EGD)  yrs ago due 2022   CORONARY ARTERY BYPASS GRAFT  x 3   S/P in February of 2010. Procedure performed in [place]   ENDARTERECTOMY Left 10/12/2018   Procedure: ENDARTERECTOMY CAROTID LEFT;  Surgeon: Oris Krystal FALCON, MD;  Location: [facility] OR;  Service: Vascular;  Laterality: Left;   EYE SURGERY  Left    cataracts   HEMORRHOID SURGERY     HEMORRHOID SURGERY N/A 02/12/2021   Procedure: HEMORRHOIDECTOMY SINGLE COLUMN;  Surgeon: Debby Hila, MD;  Location: [facility];  Service: General;  Laterality: N/A;   TONSILLECTOMY  as child    Short Social History:  Social History   Tobacco Use   Smoking status: Every Day    Current packs/day: 0.00    Average packs/day: 2.0 packs/day for 50.0 years (100.0 ttl pk-yrs)    Types: Cigars, Cigarettes    Start date: 10/21/1958    Last attempt to quit: 10/21/2008    Years since quitting: 15.5   Smokeless tobacco: Never   Tobacco comments:    Now smoking cigars 2 cigares per day  Substance Use Topics   Alcohol use: Yes    Comment: occasionally    No Known Allergies  Current Outpatient Medications  Medication Sig Dispense Refill   aspirin  81 MG tablet Take 1 tablet (81 mg total) by mouth daily.     atorvastatin  (LIPITOR ) 80 MG tablet TAKE 1 TABLET EVERY DAY AT 6PM (PLEASE KEEP JANUARY CARDIOLOGY APPOINTMENT FOR REFILLS) 90 tablet 3   ezetimibe  (ZETIA ) 10 MG tablet TAKE 1 TABLET EVERY DAY 90 tablet 1   furosemide  (LASIX ) 40 MG tablet Take 1 tablet (40 mg total)  by mouth daily. 30 tablet 3   metoprolol  tartrate (LOPRESSOR ) 50 MG tablet TAKE 1 TABLET TWICE DAILY 180 tablet 3   omeprazole (PRILOSEC) 10 MG capsule Take 10 mg by mouth daily.      tadalafil  (CIALIS ) 5 MG tablet Take 1 tablet (5 mg total) by mouth daily. 90 tablet 0   triamcinolone  cream (KENALOG ) 0.1 % Apply 1 Application topically 2 (two) times daily. 30 g 2   zolpidem  (AMBIEN ) 10 MG tablet Take 1 tablet (10 mg total) by mouth at bedtime as needed for sleep. 90 tablet 1   No current facility-administered medications for this visit.    Review of Systems  Constitutional:  Constitutional negative. HENT:       Drainage from left neck Eyes: Eyes negative.  Respiratory: Respiratory negative.  Cardiovascular: Cardiovascular negative.  GI: Gastrointestinal  negative.  Musculoskeletal: Musculoskeletal negative.  Skin: Skin negative.  Neurological: Neurological negative. Hematologic: Hematologic/lymphatic negative.  Psychiatric: Psychiatric negative.        Objective:  Objective   Vitals:   05/09/24 0833 05/09/24 0836  BP: (!) 161/78 (!) 156/77  Pulse: 68   Resp: 20   Temp: 97.7 F (36.5 C)   TempSrc: Temporal   SpO2: 98%   Weight: 212 lb 4.8 oz (96.3 kg)   Height: 5' 7 (1.702 m)    Body mass index is 33.25 kg/m.  Physical Exam HENT:     Head: Normocephalic.     Nose: Nose normal.  Eyes:     Pupils: Pupils are equal, round, and reactive to light.  Neck:     Vascular: No carotid bruit.     Comments: Draining sinus left neck as pictured below Cardiovascular:     Rate and Rhythm: Normal rate.  Pulmonary:     Effort: Pulmonary effort is normal.  Abdominal:     General: Abdomen is flat.  Musculoskeletal:        General: Normal range of motion.     Right lower leg: Edema present.     Left lower leg: Edema present.  Skin:    General: Skin is warm.     Capillary Refill: Capillary refill takes less than 2 seconds.  Neurological:     Mental Status: He is alert.     Data: BILATERAL CAROTID DUPLEX ULTRASOUND   TECHNIQUE: Elnor scale imaging, color Doppler and duplex ultrasound were performed of bilateral carotid and vertebral arteries in the neck.   COMPARISON:  06/10/2022; 08/06/2016   FINDINGS: Criteria: Quantification of carotid stenosis is based on velocity parameters that correlate the residual internal carotid diameter with NASCET-based stenosis levels, using the diameter of the distal internal carotid lumen as the denominator for stenosis measurement.   The following velocity measurements were obtained:   RIGHT   ICA: 85/25 cm/sec   CCA: 86/16 cm/sec   SYSTOLIC ICA/CCA RATIO:  1.0   ECA: 92 cm/sec   LEFT   ICA: 127/31 cm/sec   CCA: 64/15 cm/sec   SYSTOLIC ICA/CCA RATIO:  2.0   ECA: 31  cm/sec   RIGHT CAROTID ARTERY: There is a moderate amount of eccentric echogenic shadowing plaque within the right carotid bulb (image 11 and 28), extending to involve the origin and proximal aspects of the right internal carotid artery (image 31), not resulting in elevated peak systolic velocities within the interrogated course of the right internal carotid artery to suggest a hemodynamically significant stenosis.   RIGHT VERTEBRAL ARTERY:  Antegrade flow   LEFT CAROTID ARTERY: Stable postoperative  change of the left carotid bulb (image 72). There is a minimal amount of eccentric echogenic plaque/intimal wall thickening involving the origin and proximal aspects of the left internal carotid artery (image 75), which results in borderline elevated peak systolic velocities within the mid aspect of the left internal carotid artery, potentially factitiously elevated due to sampling at a location of turbulent flow. Greatest acquired peak systolic velocity within mid left ICA measures 127 centimeters/second (image 80), previously, greatest acquired peak systolic velocity within the proximal left ICA measured 237 centimeters/second.   LEFT VERTEBRAL ARTERY:  Antegrade flow   Sonographic evaluation of patient's palpable abnormality inferior to the left ear correlates with a serpiginous hypoechoic area adjacent to the prior operative site (images 86 through 95). No blood flow is demonstrated within this serpiginous palpable area though it does appear to extend to abut the left internal carotid artery.   IMPRESSION: 1. Stable postoperative change of the left carotid bulb with a minimal amount of residual/recurrent atherosclerotic plaque/intimal wall thickening resulting in borderline elevated peak systolic velocities within the mid aspect of the left internal carotid artery, compatible with the 50-69% luminal narrowing range though velocity measurements are reduced when compared to the  05/2022 examination. Further evaluation with CTA could be performed as indicated. 2. Sonographic evaluation of patient's palpable abnormality inferior to the left ear correlates with a serpiginous hypoechoic area adjacent to the prior CEA operative site. No blood flow is demonstrated within this serpiginous palpable area, though it does appear to extend to abut the left internal carotid artery. Findings are nonspecific and while potentially representative of postoperative scarring was not seen on the 05/2022 examination. Again, this could be further evaluated with CTA as indicated. 3. Moderate amount of right-sided atherosclerotic plaque, not resulting in a hemodynamically significant stenosis.  CT ANGIOGRAPHY NECK   TECHNIQUE: Multidetector CT imaging of the neck was performed using the standard protocol during bolus administration of intravenous contrast. Multiplanar CT image reconstructions and MIPs were obtained to evaluate the vascular anatomy. Carotid stenosis measurements (when applicable) are obtained utilizing NASCET criteria, using the distal internal carotid diameter as the denominator.   RADIATION DOSE REDUCTION: This exam was performed according to the departmental dose-optimization program which includes automated exposure control, adjustment of the mA and/or kV according to patient size and/or use of iterative reconstruction technique.   CONTRAST:  75mL OMNIPAQUE  IOHEXOL  350 MG/ML SOLN   COMPARISON:  Carotid artery duplex 10/24/2023.   FINDINGS: Aortic arch: Standard aortic branching. Atherosclerotic plaque within the visible thoracic aorta and proximal major branch vessels of the neck. No hemodynamically significant innominate or proximal subclavian artery stenosis.   Right carotid system: CCA and ICA patent within the neck. Atherosclerotic plaque, greatest about the carotid bifurcation and within the proximal ICA. 30% stenosis at the ICA  origin. Non-stenotic calcified plaque within the intracranial ICA.   Left carotid system: CCA and ICA patent within the neck without stenosis. Prior carotid endarterectomy. 16 x 4 mm broad-based vascular protrusion projecting laterally from the distal common carotid artery, likely reflecting postoperative vessel wall irregularity (series 601, image 154). Non-stenotic atherosclerotic plaque within the intracranial ICA.   Vertebral arteries: Codominant and patent within the neck. Non-stenotic calcified plaque within the left vertebral artery V1 and V2 segments.   Skeleton: Cervical spondylosis. Disc space narrowing is advanced at C5-C6. 3 mm C4-C5 grade 1 anterolisthesis. The patient is edentulous.   Other neck: No neck mass or cervical lymphadenopathy.   Upper chest: No consolidation within the imaged  lung apices. Prior median sternotomy.   Other: 8 mm mucous retention cyst or polyp within the right maxillary sinus. Small volume frothy secretions within the right sphenoid sinus. Mild left ethmoid sinusitis.   IMPRESSION: 1. The right common and internal carotid arteries are patent within the neck. Moderate atherosclerotic plaque about the right carotid bifurcation and within the proximal right ICA (with 30% stenosis at the ICA origin). 2. The left common and internal carotid arteries are patent within the neck. Prior left carotid endarterectomy. 16 x 4 mm broad-based vascular protrusion projecting laterally from the distal common carotid artery, likely reflecting postoperative vessel wall irregularity. 3. Vertebral arteries patent within the neck. Non-stenotic atherosclerotic plaque within the left vertebral artery V1 and V2 segments. 4. Aortic Atherosclerosis (ICD10-I70.0).     Assessment/Plan:     78 year old male with history of left carotid endarterectomy for severe asymptomatic left ICA stenosis performed over 5 years ago.  He now has several month history of swelling  followed by drainage from the left neck we reviewed CT scan together today which appears to be a draining sinus from the patch.  I have discussed the risk of leaving patch infection and possible blowout if untreated.  I discussed that the patient would require vein mapping with replacement of dacron patch with vein if available.  I did discuss that this comes at high risk of nerve injury than standard carotid endarterectomy due to the scar tissue and likely inflammatory reaction around the patch.  We discussed the risk of stroke which is likely around 5% as well.  I quoted nerve injury between 10 and 15%.  We discussed that nerve injury could require feeding tube possibly without a voice and that stroke could lead him incapacitated.  He demonstrates good understanding of this.  We also discussed that smoking can contribute to nonhealing wounds.  We will have vein mapping performed today and schedule surgery in the next 1 to 2 weeks.     Penne Lonni Colorado MD [facility]

## 2024-05-16 ENCOUNTER — Encounter (HOSPITAL_COMMUNITY): Payer: Self-pay

## 2024-05-16 NOTE — Pre-Procedure Instructions (Signed)
 Surgical Instructions   Your procedure is scheduled on May 22, 2024. Report to [facility] Main Entrance A at 5:30 A.M., then check in with the Admitting office. Any questions or running late day of surgery: call [phone]  Questions prior to your surgery date: call [phone], Monday-Friday, 8am-4pm. If you experience any cold or flu symptoms such as cough, fever, chills, shortness of breath, etc. between now and your scheduled surgery, please notify us  at the above number.     Remember:  Do not eat or drink after midnight the night before your surgery    Take these medicines the morning of surgery with A SIP OF WATER: aspirin   metoprolol  tartrate (LOPRESSOR )  omeprazole (PRILOSEC)    One week prior to surgery, STOP taking any Aleve, Naproxen, Ibuprofen, Motrin, Advil, Goody's, BC's, all herbal medications, fish oil, and non-prescription vitamins.                     Do NOT Smoke (Tobacco/Vaping) for 24 hours prior to your procedure.  If you use a CPAP at night, you may bring your mask/headgear for your overnight stay.   You will be asked to remove any contacts, glasses, piercing's, hearing aid's, dentures/partials prior to surgery. Please bring cases for these items if needed.    Patients discharged the day of surgery will not be allowed to drive home, and someone needs to stay with them for 24 hours.  SURGICAL WAITING ROOM VISITATION Patients may have no more than 2 support people in the waiting area - these visitors may rotate.   Pre-op nurse will coordinate an appropriate time for 1 ADULT support person, who may not rotate, to accompany patient in pre-op.  Children under the age of 62 must have an adult with them who is not the patient and must remain in the main waiting area with an adult.  If the patient needs to stay at the hospital during part of their recovery, the visitor guidelines for inpatient rooms apply.  Please refer to the [facility] website for  the visitor guidelines for any additional information.   If you received a COVID test during your pre-op visit  it is requested that you wear a mask when out in public, stay away from anyone that may not be feeling well and notify your surgeon if you develop symptoms. If you have been in contact with anyone that has tested positive in the last 10 days please notify you surgeon.      Pre-operative CHG Bathing Instructions   You can play a key role in reducing the risk of infection after surgery. Your skin needs to be as free of germs as possible. You can reduce the number of germs on your skin by washing with CHG (chlorhexidine  gluconate) soap before surgery. CHG is an antiseptic soap that kills germs and continues to kill germs even after washing.   DO NOT use if you have an allergy to chlorhexidine /CHG or antibacterial soaps. If your skin becomes reddened or irritated, stop using the CHG and notify one of our RNs at [phone].              TAKE A SHOWER THE NIGHT BEFORE SURGERY AND THE DAY OF SURGERY    Please keep in mind the following:  DO NOT shave, including legs and underarms, 48 hours prior to surgery.   You may shave your face before/day of surgery.  Place clean sheets on your bed the night before surgery Use a  clean washcloth (not used since being washed) for each shower. DO NOT sleep with pet's night before surgery.  CHG Shower Instructions:  Wash your face and private area with normal soap. If you choose to wash your hair, wash first with your normal shampoo.  After you use shampoo/soap, rinse your hair and body thoroughly to remove shampoo/soap residue.  Turn the water OFF and apply half the bottle of CHG soap to a CLEAN washcloth.  Apply CHG soap ONLY FROM YOUR NECK DOWN TO YOUR TOES (washing for 3-5 minutes)  DO NOT use CHG soap on face, private areas, open wounds, or sores.  Pay special attention to the area where your surgery is being performed.  If you are having  back surgery, having someone wash your back for you may be helpful. Wait 2 minutes after CHG soap is applied, then you may rinse off the CHG soap.  Pat dry with a clean towel  Put on clean pajamas    Additional instructions for the day of surgery: DO NOT APPLY any lotions, deodorants, cologne, or perfumes.   Do not wear jewelry or makeup Do not wear nail polish, gel polish, artificial nails, or any other type of covering on natural nails (fingers and toes) Do not bring valuables to the hospital. [facility] is not responsible for valuables/personal belongings. Put on clean/comfortable clothes.  Please brush your teeth.  Ask your nurse before applying any prescription medications to the skin.

## 2024-05-17 ENCOUNTER — Encounter (HOSPITAL_COMMUNITY)
Admission: RE | Admit: 2024-05-17 | Discharge: 2024-05-17 | Disposition: A | Discharge status: Home / Self Care | Source: Ambulatory Visit | Attending: Vascular Surgery | Admitting: Vascular Surgery

## 2024-05-17 ENCOUNTER — Encounter (HOSPITAL_COMMUNITY): Payer: Self-pay

## 2024-05-17 ENCOUNTER — Other Ambulatory Visit: Payer: Self-pay

## 2024-05-17 VITALS — BP 147/82 | HR 72 | Temp 98.3°F | Resp 17 | Ht 67.0 in | Wt 214.0 lb

## 2024-05-17 DIAGNOSIS — I2510 Atherosclerotic heart disease of native coronary artery without angina pectoris: Secondary | ICD-10-CM | POA: Diagnosis not present

## 2024-05-17 DIAGNOSIS — I6522 Occlusion and stenosis of left carotid artery: Secondary | ICD-10-CM | POA: Diagnosis not present

## 2024-05-17 DIAGNOSIS — Z01818 Encounter for other preprocedural examination: Secondary | ICD-10-CM

## 2024-05-17 DIAGNOSIS — Z951 Presence of aortocoronary bypass graft: Secondary | ICD-10-CM | POA: Insufficient documentation

## 2024-05-17 DIAGNOSIS — E785 Hyperlipidemia, unspecified: Secondary | ICD-10-CM | POA: Diagnosis not present

## 2024-05-17 DIAGNOSIS — D649 Anemia, unspecified: Secondary | ICD-10-CM | POA: Insufficient documentation

## 2024-05-17 DIAGNOSIS — Z01812 Encounter for preprocedural laboratory examination: Secondary | ICD-10-CM | POA: Diagnosis not present

## 2024-05-17 DIAGNOSIS — F17200 Nicotine dependence, unspecified, uncomplicated: Secondary | ICD-10-CM | POA: Insufficient documentation

## 2024-05-17 HISTORY — DX: Peripheral vascular disease, unspecified: I73.9

## 2024-05-17 LAB — CBC
HCT: 41.9 % (ref 39.0–52.0)
Hemoglobin: 12.4 g/dL — ABNORMAL LOW (ref 13.0–17.0)
MCH: 24.1 pg — ABNORMAL LOW (ref 26.0–34.0)
MCHC: 29.6 g/dL — ABNORMAL LOW (ref 30.0–36.0)
MCV: 81.4 fL (ref 80.0–100.0)
Platelets: 207 K/uL (ref 150–400)
RBC: 5.15 MIL/uL (ref 4.22–5.81)
RDW: 18.2 % — ABNORMAL HIGH (ref 11.5–15.5)
WBC: 8.7 K/uL (ref 4.0–10.5)
nRBC: 0 % (ref 0.0–0.2)

## 2024-05-17 LAB — COMPREHENSIVE METABOLIC PANEL WITH GFR
ALT: 15 U/L (ref 0–44)
AST: 24 U/L (ref 15–41)
Albumin: 3.4 g/dL — ABNORMAL LOW (ref 3.5–5.0)
Alkaline Phosphatase: 73 U/L (ref 38–126)
Anion gap: 10 (ref 5–15)
BUN: 7 mg/dL — ABNORMAL LOW (ref 8–23)
CO2: 21 mmol/L — ABNORMAL LOW (ref 22–32)
Calcium: 8.8 mg/dL — ABNORMAL LOW (ref 8.9–10.3)
Chloride: 108 mmol/L (ref 98–111)
Creatinine, Ser: 1.18 mg/dL (ref 0.61–1.24)
GFR, Estimated: >60 mL/min (ref >60)
Glucose, Bld: 100 mg/dL — ABNORMAL HIGH (ref 70–99)
Potassium: 4.1 mmol/L (ref 3.5–5.1)
Sodium: 139 mmol/L (ref 135–145)
Total Bilirubin: 0.7 mg/dL (ref 0.0–1.2)
Total Protein: 6.7 g/dL (ref 6.5–8.1)

## 2024-05-17 LAB — APTT: aPTT: 24 s (ref 24–36)

## 2024-05-17 LAB — URINALYSIS, ROUTINE W REFLEX MICROSCOPIC
Bilirubin Urine: NEGATIVE
Glucose, UA: NEGATIVE mg/dL
Ketones, ur: NEGATIVE mg/dL
Leukocytes,Ua: NEGATIVE
Nitrite: NEGATIVE
Protein, ur: NEGATIVE mg/dL
Specific Gravity, Urine: 1.009 (ref 1.005–1.030)
pH: 6 (ref 5.0–8.0)

## 2024-05-17 LAB — SURGICAL PCR SCREEN
MRSA, PCR: NEGATIVE
Staphylococcus aureus: NEGATIVE

## 2024-05-17 LAB — TYPE AND SCREEN
ABO/RH(D): O POS
Antibody Screen: NEGATIVE

## 2024-05-17 LAB — PROTIME-INR
INR: 1.1 (ref 0.8–1.2)
Prothrombin Time: 14.7 s (ref 11.4–15.2)

## 2024-05-17 NOTE — Progress Notes (Signed)
 PCP - Dr. Mabel Pry Cardiologist - Dr. Redell Shallow, LOV 09/28/2023, 1 year f/u  PPM/ICD - denies Device Orders - na Rep Notified - na  Chest x-ray - na EKG - 1/8/20225 Stress Test - 05/05/2017 ECHO - 2/3/20225 Cardiac Cath -   Sleep Study - denies CPAP - na  Non-diabetic  Blood Thinner Instructions: denies Aspirin  Instructions: continue  ERAS Protcol - NPO  Anesthesia review: Yes. CAD, CABG, MI, cardiac clearance  Patient denies shortness of breath, fever, cough and chest pain at PAT appointment   All instructions explained to the patient, with a verbal understanding of the material. Patient agrees to go over the instructions while at home for a better understanding. Patient also instructed to self quarantine after being tested for COVID-19. The opportunity to ask questions was provided.

## 2024-05-18 NOTE — Anesthesia Preprocedure Evaluation (Addendum)
 Anesthesia Evaluation  Patient identified by MRN, date of birth, ID band Patient awake    Reviewed: Allergy & Precautions, NPO status , Patient's Chart, lab work & pertinent test results  Airway Mallampati: III  TM Distance: >3 FB Neck ROM: Full    Dental  (+) Dental Advisory Given   Pulmonary Current Smoker and Patient abstained from smoking.   breath sounds clear to auscultation       Cardiovascular hypertension, Pt. on medications and Pt. on home beta blockers + CAD, + Past MI and + Peripheral Vascular Disease   Rhythm:Regular     Neuro/Psych negative neurological ROS  negative psych ROS   GI/Hepatic ,GERD  Medicated and Controlled,,  Endo/Other  negative endocrine ROS    Renal/GU Renal disease     Musculoskeletal negative musculoskeletal ROS (+)    Abdominal   Peds  Hematology negative hematology ROS (+)   Anesthesia Other Findings   Reproductive/Obstetrics                              Anesthesia Physical Anesthesia Plan  ASA: 3  Anesthesia Plan: General   Post-op Pain Management:    Induction: Intravenous  PONV Risk Score and Plan: 2 and Ondansetron  and Dexamethasone   Airway Management Planned: Oral ETT  Additional Equipment: Arterial line  Intra-op Plan:   Post-operative Plan: Extubation in OR  Informed Consent: I have reviewed the patients History and Physical, chart, labs and discussed the procedure including the risks, benefits and alternatives for the proposed anesthesia with the patient or authorized representative who has indicated his/her understanding and acceptance.     Dental advisory given  Plan Discussed with:   Anesthesia Plan Comments: (PAT note by Lynwood Hope, PA-C: 78 yo male current smoker follows with cardiology for hx of HLD, LE edema, CAD s/p CABG in [place] in 2010 (LIMA to the LAD, saphenous vein graft to the circumflex, and free RIMA to  the ramus), hx of left carotid endarterectomy for severe asymptomatic left ICA stenosis 09/2018. Last seen by Dr. Pietro 09/28/23. Update echo and carotid dopplers ordered at that time. Echo showed EF 55-60%, hypokinesis of basal inferoseptal and inferior segments, grade 1 dd, normal RV, mild MR. Carotid duplex showed hypoechoic area adjacent to the prior CEA that appeared to extend to abut the left internal carotid artery. Subsequent CTA showed 16 x 4 mm broad-based vascular protrusion projecting laterally from the distal common carotid artery, likely reflecting postoperative vessel wall irregularity.  He was seen in followup by Dr. Sheree on 05/09/24 and noted to have several month history of swelling followed by drainage from the left neck we. Review of CT scan appeared to show draining sinus from dacron patch. He was recommended to undergo revision with replacement of dacron with vein if available.   Preop labs reviewed, mild anemia Hgb 12.4, otherwise unremarkable.  EKG 09/28/23: Sinus rhythm with 1st degree A-V block. Rate 70. Cannot rule out Inferior infarct , age undetermined  Carotid duplex 10/24/23: IMPRESSION: 1. Stable postoperative change of the left carotid bulb with a minimal amount of residual/recurrent atherosclerotic plaque/intimal wall thickening resulting in borderline elevated peak systolic velocities within the mid aspect of the left internal carotid artery, compatible with the 50-69% luminal narrowing range though velocity measurements are reduced when compared to the 05/2022 examination. Further evaluation with CTA could be performed as indicated. 2. Sonographic evaluation of patient's palpable abnormality inferior to the left ear correlates  with a serpiginous hypoechoic area adjacent to the prior CEA operative site. No blood flow is demonstrated within this serpiginous palpable area, though it does appear to extend to abut the left internal carotid artery. Findings are  nonspecific and while potentially representative of postoperative scarring was not seen on the 05/2022 examination. Again, this could be further evaluated with CTA as indicated. 3. Moderate amount of right-sided atherosclerotic plaque, not resulting in a hemodynamically significant stenosis.   These results will be called to the ordering clinician or representative by the Radiologist Assistant, and communication documented in the PACS or [employer].   TTE 10/24/23:  1. Left ventricular ejection fraction, by estimation, is 55 to 60%. The  left ventricle has normal function. The left ventricle demonstrates  regional wall motion abnormalities with hypokinesis of basal inferoseptal  and inferior segments. There is mild  left ventricular hypertrophy. Left ventricular diastolic parameters are  consistent with Grade I diastolic dysfunction (impaired relaxation).   2. Right ventricular systolic function is normal. The right ventricular  size is normal. There is normal pulmonary artery systolic pressure.   3. The mitral valve is grossly normal. Mild mitral valve regurgitation.  No evidence of mitral stenosis.   4. The aortic valve was not well visualized. Aortic valve regurgitation  is trivial. No aortic stenosis is present.   5. The inferior vena cava is normal in size with greater than 50%  respiratory variability, suggesting right atrial pressure of 3 mmHg.   Comparison(s): Echocardiogram done 11/06/08 showed an EF of 50-55%. Nuclear  stress test done 05/06/17 showed an EF of 50%.     )         Anesthesia Quick Evaluation

## 2024-05-18 NOTE — Progress Notes (Signed)
 Anesthesia Chart Review:  78 yo male current smoker follows with cardiology for hx of HLD, LE edema, CAD s/p CABG in [place] in 2010 (LIMA to the LAD, saphenous vein graft to the circumflex, and free RIMA to the ramus), hx of left carotid endarterectomy for severe asymptomatic left ICA stenosis 09/2018. Last seen by Dr. Pietro 09/28/23. Update echo and carotid dopplers ordered at that time. Echo showed EF 55-60%, hypokinesis of basal inferoseptal and inferior segments, grade 1 dd, normal RV, mild MR. Carotid duplex showed hypoechoic area adjacent to the prior CEA that appeared to extend to abut the left internal carotid artery. Subsequent CTA showed 16 x 4 mm broad-based vascular protrusion projecting laterally from the distal common carotid artery, likely reflecting postoperative vessel wall irregularity.  He was seen in followup by Dr. Sheree on 05/09/24 and noted to have several month history of swelling followed by drainage from the left neck we. Review of CT scan appeared to show draining sinus from dacron patch. He was recommended to undergo revision with replacement of dacron with vein if available.   Preop labs reviewed, mild anemia Hgb 12.4, otherwise unremarkable.  EKG 09/28/23: Sinus rhythm with 1st degree A-V block. Rate 70. Cannot rule out Inferior infarct , age undetermined  Carotid duplex 10/24/23: IMPRESSION: 1. Stable postoperative change of the left carotid bulb with a minimal amount of residual/recurrent atherosclerotic plaque/intimal wall thickening resulting in borderline elevated peak systolic velocities within the mid aspect of the left internal carotid artery, compatible with the 50-69% luminal narrowing range though velocity measurements are reduced when compared to the 05/2022 examination. Further evaluation with CTA could be performed as indicated. 2. Sonographic evaluation of patient's palpable abnormality inferior to the left ear correlates with a serpiginous hypoechoic  area adjacent to the prior CEA operative site. No blood flow is demonstrated within this serpiginous palpable area, though it does appear to extend to abut the left internal carotid artery. Findings are nonspecific and while potentially representative of postoperative scarring was not seen on the 05/2022 examination. Again, this could be further evaluated with CTA as indicated. 3. Moderate amount of right-sided atherosclerotic plaque, not resulting in a hemodynamically significant stenosis.   These results will be called to the ordering clinician or representative by the Radiologist Assistant, and communication documented in the PACS or [employer].   TTE 10/24/23:  1. Left ventricular ejection fraction, by estimation, is 55 to 60%. The  left ventricle has normal function. The left ventricle demonstrates  regional wall motion abnormalities with hypokinesis of basal inferoseptal  and inferior segments. There is mild  left ventricular hypertrophy. Left ventricular diastolic parameters are  consistent with Grade I diastolic dysfunction (impaired relaxation).   2. Right ventricular systolic function is normal. The right ventricular  size is normal. There is normal pulmonary artery systolic pressure.   3. The mitral valve is grossly normal. Mild mitral valve regurgitation.  No evidence of mitral stenosis.   4. The aortic valve was not well visualized. Aortic valve regurgitation  is trivial. No aortic stenosis is present.   5. The inferior vena cava is normal in size with greater than 50%  respiratory variability, suggesting right atrial pressure of 3 mmHg.   Comparison(s): Echocardiogram done 11/06/08 showed an EF of 50-55%. Nuclear  stress test done 05/06/17 showed an EF of 50%.      Lynwood Geofm RIGGERS [facility]/Anesthesiology Phone [phone] 05/18/2024 10:36 AM

## 2024-05-19 ENCOUNTER — Other Ambulatory Visit: Payer: Self-pay | Admitting: Family Medicine

## 2024-05-19 DIAGNOSIS — N529 Male erectile dysfunction, unspecified: Secondary | ICD-10-CM

## 2024-05-19 DIAGNOSIS — N401 Enlarged prostate with lower urinary tract symptoms: Secondary | ICD-10-CM

## 2024-05-22 ENCOUNTER — Encounter (HOSPITAL_COMMUNITY)
Admission: RE | Disposition: A | Discharge status: Home / Self Care | Payer: Self-pay | Source: Home / Self Care | Attending: Vascular Surgery

## 2024-05-22 ENCOUNTER — Encounter (HOSPITAL_COMMUNITY): Payer: Self-pay | Admitting: Vascular Surgery

## 2024-05-22 ENCOUNTER — Inpatient Hospital Stay (HOSPITAL_COMMUNITY): Admitting: Anesthesiology

## 2024-05-22 ENCOUNTER — Other Ambulatory Visit: Payer: Self-pay

## 2024-05-22 ENCOUNTER — Inpatient Hospital Stay (HOSPITAL_COMMUNITY)
Admission: RE | Admit: 2024-05-22 | Discharge: 2024-05-25 | DRG: 253 | Disposition: A | Discharge status: Home / Self Care | Attending: Vascular Surgery | Admitting: Vascular Surgery

## 2024-05-22 ENCOUNTER — Inpatient Hospital Stay (HOSPITAL_COMMUNITY): Payer: Self-pay | Admitting: Physician Assistant

## 2024-05-22 DIAGNOSIS — I6522 Occlusion and stenosis of left carotid artery: Secondary | ICD-10-CM | POA: Diagnosis not present

## 2024-05-22 DIAGNOSIS — Z951 Presence of aortocoronary bypass graft: Secondary | ICD-10-CM

## 2024-05-22 DIAGNOSIS — T827XXA Infection and inflammatory reaction due to other cardiac and vascular devices, implants and grafts, initial encounter: Principal | ICD-10-CM | POA: Diagnosis present

## 2024-05-22 DIAGNOSIS — I10 Essential (primary) hypertension: Secondary | ICD-10-CM | POA: Diagnosis present

## 2024-05-22 DIAGNOSIS — Y832 Surgical operation with anastomosis, bypass or graft as the cause of abnormal reaction of the patient, or of later complication, without mention of misadventure at the time of the procedure: Secondary | ICD-10-CM | POA: Diagnosis present

## 2024-05-22 DIAGNOSIS — I2510 Atherosclerotic heart disease of native coronary artery without angina pectoris: Secondary | ICD-10-CM | POA: Diagnosis not present

## 2024-05-22 DIAGNOSIS — T82391A Other mechanical complication of carotid arterial graft (bypass), initial encounter: Secondary | ICD-10-CM | POA: Diagnosis not present

## 2024-05-22 DIAGNOSIS — K219 Gastro-esophageal reflux disease without esophagitis: Secondary | ICD-10-CM | POA: Diagnosis not present

## 2024-05-22 DIAGNOSIS — R000 Tachycardia, unspecified: Secondary | ICD-10-CM | POA: Diagnosis not present

## 2024-05-22 DIAGNOSIS — Z48812 Encounter for surgical aftercare following surgery on the circulatory system: Secondary | ICD-10-CM | POA: Diagnosis not present

## 2024-05-22 DIAGNOSIS — Z806 Family history of leukemia: Secondary | ICD-10-CM | POA: Diagnosis not present

## 2024-05-22 DIAGNOSIS — I252 Old myocardial infarction: Secondary | ICD-10-CM

## 2024-05-22 DIAGNOSIS — F17290 Nicotine dependence, other tobacco product, uncomplicated: Secondary | ICD-10-CM | POA: Diagnosis not present

## 2024-05-22 DIAGNOSIS — E785 Hyperlipidemia, unspecified: Secondary | ICD-10-CM | POA: Diagnosis not present

## 2024-05-22 DIAGNOSIS — Z79899 Other long term (current) drug therapy: Secondary | ICD-10-CM

## 2024-05-22 DIAGNOSIS — T81329A Deep disruption or dehiscence of operation wound, unspecified, initial encounter: Secondary | ICD-10-CM

## 2024-05-22 DIAGNOSIS — I739 Peripheral vascular disease, unspecified: Secondary | ICD-10-CM | POA: Diagnosis present

## 2024-05-22 DIAGNOSIS — Z7982 Long term (current) use of aspirin: Secondary | ICD-10-CM | POA: Diagnosis not present

## 2024-05-22 DIAGNOSIS — Z8249 Family history of ischemic heart disease and other diseases of the circulatory system: Secondary | ICD-10-CM | POA: Diagnosis not present

## 2024-05-22 HISTORY — PX: ENDARTERECTOMY: SHX5162

## 2024-05-22 HISTORY — PX: PATCH ANGIOPLASTY: SHX6230

## 2024-05-22 HISTORY — PX: ARTERY EXPLORATION: SHX5110

## 2024-05-22 HISTORY — PX: VEIN HARVEST: SHX6363

## 2024-05-22 SURGERY — SURGICAL PROCUREMENT, VEIN
Anesthesia: General | Site: Neck | Laterality: Left

## 2024-05-22 MED ORDER — ACETAMINOPHEN 650 MG RE SUPP: 325.0000 mg | RECTAL | Status: DC | PRN

## 2024-05-22 MED ORDER — METOPROLOL TARTRATE 5 MG/5ML IV SOLN: 2.5000 mg | INTRAVENOUS | Status: DC | PRN

## 2024-05-22 MED ORDER — FENTANYL CITRATE (PF) 250 MCG/5ML IJ SOLN
INTRAMUSCULAR | Status: DC | PRN
  Administered 2024-05-22: 50 ug via INTRAVENOUS
  Administered 2024-05-22 (×2): 100 ug via INTRAVENOUS

## 2024-05-22 MED ORDER — POLYETHYLENE GLYCOL 3350 17 G PO PACK: 17.0000 g | PACK | Freq: Every day | ORAL | Status: DC | PRN

## 2024-05-22 MED ORDER — FENTANYL CITRATE (PF) 100 MCG/2ML IJ SOLN
25.0000 ug | INTRAMUSCULAR | Status: DC | PRN
  Administered 2024-05-22 (×2): 50 ug via INTRAVENOUS

## 2024-05-22 MED ORDER — ASPIRIN 81 MG PO TBEC
81.0000 mg | DELAYED_RELEASE_TABLET | Freq: Every day | ORAL | Status: DC
  Administered 2024-05-23 – 2024-05-24 (×2): 81 mg via ORAL
  Filled 2024-05-22 (×3): qty 1

## 2024-05-22 MED ORDER — CHLORHEXIDINE GLUCONATE 0.12 % MT SOLN
15.0000 mL | Freq: Once | OROMUCOSAL | Status: AC
  Administered 2024-05-22: 15 mL via OROMUCOSAL
  Filled 2024-05-22: qty 15

## 2024-05-22 MED ORDER — ATROPINE SULFATE 0.4 MG/ML IV SOLN
INTRAVENOUS | Status: AC
  Filled 2024-05-22: qty 1

## 2024-05-22 MED ORDER — PHENOL 1.4 % MT LIQD: 1.0000 | OROMUCOSAL | Status: DC | PRN

## 2024-05-22 MED ORDER — LIDOCAINE HCL (PF) 1 % IJ SOLN
INTRAMUSCULAR | Status: AC
Start: 2024-05-22 — End: 2024-05-22
  Filled 2024-05-22: qty 5

## 2024-05-22 MED ORDER — LABETALOL HCL 5 MG/ML IV SOLN
10.0000 mg | INTRAVENOUS | Status: AC | PRN
  Administered 2024-05-22 – 2024-05-24 (×4): 10 mg via INTRAVENOUS
  Filled 2024-05-22 (×4): qty 4

## 2024-05-22 MED ORDER — BISACODYL 10 MG RE SUPP: 10.0000 mg | Freq: Every day | RECTAL | Status: DC | PRN

## 2024-05-22 MED ORDER — HEPARIN 6000 UNIT IRRIGATION SOLUTION
Status: DC | PRN
  Administered 2024-05-22: 1

## 2024-05-22 MED ORDER — PROPOFOL 10 MG/ML IV BOLUS
INTRAVENOUS | Status: DC | PRN
  Administered 2024-05-22: 40 mg via INTRAVENOUS
  Administered 2024-05-22: 100 mg via INTRAVENOUS

## 2024-05-22 MED ORDER — EPHEDRINE SULFATE-NACL 50-0.9 MG/10ML-% IV SOSY
PREFILLED_SYRINGE | INTRAVENOUS | Status: DC | PRN
  Administered 2024-05-22 (×2): 5 mg via INTRAVENOUS
  Administered 2024-05-22 (×2): 10 mg via INTRAVENOUS

## 2024-05-22 MED ORDER — CEFAZOLIN SODIUM-DEXTROSE 2-4 GM/100ML-% IV SOLN
2.0000 g | Freq: Three times a day (TID) | INTRAVENOUS | Status: AC
  Administered 2024-05-22 – 2024-05-23 (×2): 2 g via INTRAVENOUS
  Filled 2024-05-22 (×2): qty 100

## 2024-05-22 MED ORDER — PROPOFOL 10 MG/ML IV BOLUS
INTRAVENOUS | Status: AC
  Filled 2024-05-22: qty 20

## 2024-05-22 MED ORDER — FENTANYL CITRATE (PF) 100 MCG/2ML IJ SOLN
INTRAMUSCULAR | Status: AC
Start: 2024-05-22 — End: 2024-05-22
  Filled 2024-05-22: qty 2

## 2024-05-22 MED ORDER — HEPARIN SODIUM (PORCINE) 1000 UNIT/ML IJ SOLN
INTRAMUSCULAR | Status: DC | PRN
Start: 2024-05-22 — End: 2024-05-22
  Administered 2024-05-22: 10000 [IU] via INTRAVENOUS
  Administered 2024-05-22: 2000 [IU] via INTRAVENOUS

## 2024-05-22 MED ORDER — HEMOSTATIC AGENTS (NO CHARGE) OPTIME
TOPICAL | Status: DC | PRN
  Administered 2024-05-22: 1 via TOPICAL

## 2024-05-22 MED ORDER — EZETIMIBE 10 MG PO TABS
10.0000 mg | ORAL_TABLET | Freq: Every day | ORAL | Status: DC
  Administered 2024-05-23 – 2024-05-24 (×2): 10 mg via ORAL
  Filled 2024-05-22 (×2): qty 1

## 2024-05-22 MED ORDER — SODIUM CHLORIDE 0.9 % IV SOLN
0.1500 ug/kg/min | Freq: Once | INTRAVENOUS | Status: AC
  Administered 2024-05-22: .2 ug/kg/min via INTRAVENOUS
  Filled 2024-05-22 (×2): qty 2000

## 2024-05-22 MED ORDER — SUGAMMADEX SODIUM 200 MG/2ML IV SOLN
INTRAVENOUS | Status: DC | PRN
  Administered 2024-05-22: 200 mg via INTRAVENOUS

## 2024-05-22 MED ORDER — ZOLPIDEM TARTRATE 5 MG PO TABS
10.0000 mg | ORAL_TABLET | Freq: Every evening | ORAL | Status: DC | PRN
  Administered 2024-05-23 – 2024-05-24 (×2): 10 mg via ORAL
  Filled 2024-05-22 (×3): qty 2

## 2024-05-22 MED ORDER — SODIUM CHLORIDE 0.9 % IV SOLN: INTRAVENOUS | Status: DC

## 2024-05-22 MED ORDER — LABETALOL HCL 5 MG/ML IV SOLN
INTRAVENOUS | Status: AC
Start: 2024-05-22 — End: 2024-05-22
  Filled 2024-05-22: qty 4

## 2024-05-22 MED ORDER — PHENYLEPHRINE 80 MCG/ML (10ML) SYRINGE FOR IV PUSH (FOR BLOOD PRESSURE SUPPORT)
PREFILLED_SYRINGE | INTRAVENOUS | Status: DC | PRN
  Administered 2024-05-22: 160 ug via INTRAVENOUS
  Administered 2024-05-22 (×2): 80 ug via INTRAVENOUS

## 2024-05-22 MED ORDER — MORPHINE SULFATE (PF) 2 MG/ML IV SOLN
2.0000 mg | INTRAVENOUS | Status: DC | PRN
  Administered 2024-05-22 – 2024-05-23 (×3): 2 mg via INTRAVENOUS
  Filled 2024-05-22 (×3): qty 1

## 2024-05-22 MED ORDER — CEFAZOLIN SODIUM-DEXTROSE 2-4 GM/100ML-% IV SOLN
2.0000 g | INTRAVENOUS | Status: DC
  Filled 2024-05-22: qty 100

## 2024-05-22 MED ORDER — HEPARIN 6000 UNIT IRRIGATION SOLUTION
Status: AC
  Filled 2024-05-22: qty 500

## 2024-05-22 MED ORDER — POTASSIUM CHLORIDE CRYS ER 20 MEQ PO TBCR: 40.0000 meq | EXTENDED_RELEASE_TABLET | Freq: Every day | ORAL | Status: DC | PRN

## 2024-05-22 MED ORDER — CHLORHEXIDINE GLUCONATE CLOTH 2 % EX PADS: 6.0000 | MEDICATED_PAD | Freq: Once | CUTANEOUS | Status: DC

## 2024-05-22 MED ORDER — ORAL CARE MOUTH RINSE: 15.0000 mL | Freq: Once | OROMUCOSAL | Status: AC

## 2024-05-22 MED ORDER — DOCUSATE SODIUM 100 MG PO CAPS
100.0000 mg | ORAL_CAPSULE | Freq: Every day | ORAL | Status: DC
  Filled 2024-05-22 (×2): qty 1

## 2024-05-22 MED ORDER — DEXAMETHASONE SODIUM PHOSPHATE 10 MG/ML IJ SOLN
INTRAMUSCULAR | Status: DC | PRN
  Administered 2024-05-22: 10 mg via INTRAVENOUS

## 2024-05-22 MED ORDER — 0.9 % SODIUM CHLORIDE (POUR BTL) OPTIME
TOPICAL | Status: DC | PRN
  Administered 2024-05-22: 1000 mL

## 2024-05-22 MED ORDER — ROCURONIUM BROMIDE 10 MG/ML (PF) SYRINGE
PREFILLED_SYRINGE | INTRAVENOUS | Status: DC | PRN
  Administered 2024-05-22: 30 mg via INTRAVENOUS
  Administered 2024-05-22: 50 mg via INTRAVENOUS
  Administered 2024-05-22: 20 mg via INTRAVENOUS

## 2024-05-22 MED ORDER — PHENYLEPHRINE HCL-NACL 20-0.9 MG/250ML-% IV SOLN
INTRAVENOUS | Status: DC | PRN
  Administered 2024-05-22: 30 ug/min via INTRAVENOUS

## 2024-05-22 MED ORDER — FENTANYL CITRATE (PF) 250 MCG/5ML IJ SOLN
INTRAMUSCULAR | Status: AC
  Filled 2024-05-22: qty 5

## 2024-05-22 MED ORDER — ONDANSETRON HCL 4 MG/2ML IJ SOLN
INTRAMUSCULAR | Status: DC | PRN
  Administered 2024-05-22: 4 mg via INTRAVENOUS

## 2024-05-22 MED ORDER — ONDANSETRON HCL 4 MG/2ML IJ SOLN
4.0000 mg | Freq: Four times a day (QID) | INTRAMUSCULAR | Status: DC | PRN
  Administered 2024-05-22: 4 mg via INTRAVENOUS
  Filled 2024-05-22: qty 2

## 2024-05-22 MED ORDER — ACETAMINOPHEN 325 MG PO TABS: 325.0000 mg | ORAL_TABLET | ORAL | Status: DC | PRN

## 2024-05-22 MED ORDER — HYDRALAZINE HCL 20 MG/ML IJ SOLN
5.0000 mg | INTRAMUSCULAR | Status: DC | PRN
  Administered 2024-05-23: 5 mg via INTRAVENOUS
  Filled 2024-05-22: qty 1

## 2024-05-22 MED ORDER — CEFAZOLIN SODIUM-DEXTROSE 2-3 GM-%(50ML) IV SOLR
INTRAVENOUS | Status: DC | PRN
  Administered 2024-05-22: 2 g via INTRAVENOUS

## 2024-05-22 MED ORDER — SUCCINYLCHOLINE CHLORIDE 200 MG/10ML IV SOSY
PREFILLED_SYRINGE | INTRAVENOUS | Status: DC | PRN
  Administered 2024-05-22: 140 mg via INTRAVENOUS

## 2024-05-22 MED ORDER — SODIUM CHLORIDE 0.9 % IV SOLN: INTRAVENOUS | Status: AC

## 2024-05-22 MED ORDER — ATORVASTATIN CALCIUM 80 MG PO TABS
80.0000 mg | ORAL_TABLET | Freq: Every day | ORAL | Status: DC
  Administered 2024-05-23 – 2024-05-24 (×2): 80 mg via ORAL
  Filled 2024-05-22 (×4): qty 1

## 2024-05-22 MED ORDER — SODIUM CHLORIDE 0.9 % IV SOLN: 500.0000 mL | Freq: Once | INTRAVENOUS | Status: DC | PRN

## 2024-05-22 MED ORDER — PROTAMINE SULFATE 10 MG/ML IV SOLN
INTRAVENOUS | Status: DC | PRN
Start: 2024-05-22 — End: 2024-05-22
  Administered 2024-05-22: 50 mg via INTRAVENOUS

## 2024-05-22 MED ORDER — LACTATED RINGERS IV SOLN: INTRAVENOUS | Status: DC | PRN

## 2024-05-22 MED ORDER — ALBUMIN HUMAN 5 % IV SOLN
INTRAVENOUS | Status: DC | PRN
Start: 2024-05-22 — End: 2024-05-22

## 2024-05-22 MED ORDER — LABETALOL HCL 5 MG/ML IV SOLN
10.0000 mg | Freq: Once | INTRAVENOUS | Status: AC
  Administered 2024-05-22: 10 mg via INTRAVENOUS

## 2024-05-22 MED ORDER — LIDOCAINE 2% (20 MG/ML) 5 ML SYRINGE
INTRAMUSCULAR | Status: DC | PRN
  Administered 2024-05-22: 80 mg via INTRAVENOUS

## 2024-05-22 MED ORDER — GLYCOPYRROLATE 0.2 MG/ML IJ SOLN
INTRAMUSCULAR | Status: DC | PRN
  Administered 2024-05-22: .2 mg via INTRAVENOUS

## 2024-05-22 MED ORDER — LACTATED RINGERS IV SOLN: INTRAVENOUS | Status: DC

## 2024-05-22 SURGICAL SUPPLY — 33 items
BAG COUNTER SPONGE SURGICOUNT (BAG) ×2 IMPLANT
CANISTER SUCTION 3000ML PPV (SUCTIONS) ×2 IMPLANT
CANNULA VESSEL 3MM 2 BLNT TIP (CANNULA) IMPLANT
CATH ROBINSON RED A/P 18FR (CATHETERS) ×2 IMPLANT
CLIP LIGATING EXTRA MED SLVR (CLIP) ×2 IMPLANT
CLIP LIGATING EXTRA SM BLUE (MISCELLANEOUS) ×2 IMPLANT
CNTNR URN SCR LID CUP LEK RST (MISCELLANEOUS) IMPLANT
COVER PROBE W GEL 5X96 (DRAPES) ×2 IMPLANT
DERMABOND ADVANCED .7 DNX12 (GAUZE/BANDAGES/DRESSINGS) IMPLANT
DRAPE SURG ORHT 6 SPLT 77X108 (DRAPES) IMPLANT
DRSG COVADERM 4X8 (GAUZE/BANDAGES/DRESSINGS) IMPLANT
ELECTRODE REM PT RTRN 9FT ADLT (ELECTROSURGICAL) ×2 IMPLANT
GAUZE 4X4 16PLY ~~LOC~~+RFID DBL (SPONGE) IMPLANT
GLOVE BIO SURGEON STRL SZ7.5 (GLOVE) ×2 IMPLANT
GOWN STRL REUS W/ TWL LRG LVL3 (GOWN DISPOSABLE) ×4 IMPLANT
GOWN STRL REUS W/ TWL XL LVL3 (GOWN DISPOSABLE) ×2 IMPLANT
KIT BASIN OR (CUSTOM PROCEDURE TRAY) ×2 IMPLANT
KIT SHUNT ARGYLE CAROTID ART 6 (VASCULAR PRODUCTS) ×2 IMPLANT
KIT TURNOVER KIT B (KITS) ×2 IMPLANT
NS IRRIG 1000ML POUR BTL (IV SOLUTION) ×6 IMPLANT
PACK CAROTID (CUSTOM PROCEDURE TRAY) ×2 IMPLANT
PAD ARMBOARD POSITIONER FOAM (MISCELLANEOUS) ×4 IMPLANT
POSITIONER HEAD DONUT 9IN (MISCELLANEOUS) ×2 IMPLANT
POWDER SURGICEL 3.0 GRAM (HEMOSTASIS) IMPLANT
STAPLER SKIN PROX 35W (STAPLE) IMPLANT
SUT MNCRL AB 4-0 PS2 18 (SUTURE) ×2 IMPLANT
SUT PROLENE 6 0 BV (SUTURE) ×2 IMPLANT
SUT VIC AB 2-0 CT1 TAPERPNT 27 (SUTURE) IMPLANT
SUT VIC AB 3-0 SH 27X BRD (SUTURE) ×2 IMPLANT
SWAB COLLECTION DEVICE MRSA (MISCELLANEOUS) IMPLANT
TAPE UMBILICAL 1/8X30 (MISCELLANEOUS) IMPLANT
TOWEL GREEN STERILE (TOWEL DISPOSABLE) ×2 IMPLANT
WATER STERILE IRR 1000ML POUR (IV SOLUTION) ×2 IMPLANT

## 2024-05-22 NOTE — Discharge Instructions (Signed)
   [facility]  Discharge Instructions   Carotid Surgery  Please refer to the following instructions for your post-procedure care. Your surgeon or physician assistant will discuss any changes with you.  Activity  You are encouraged to walk as much as you can. You can slowly return to normal activities but must avoid strenuous activity and heavy lifting until your doctor tell you it's okay. Avoid activities such as vacuuming or swinging a golf club. You can drive after one week if you are comfortable and you are no longer taking prescription pain medications. It is normal to feel tired for serval weeks after your surgery. It is also normal to have difficulty with sleep habits, eating, and bowel movements after surgery. These will go away with time.  Bathing/Showering  Shower daily after you go home. Do not soak in a bathtub, hot tub, or swim until the incision heals completely.  Incision Care  Shower every day. Clean your incision with mild soap and water. Pat the area dry with a clean towel. You do not need a bandage unless otherwise instructed. Do not apply any ointments or creams to your incision. You may have skin glue on your incision. Do not peel it off. It will come off on its own in about one week. Your incision may feel thickened and raised for several weeks after your surgery. This is normal and the skin will soften over time.   For Men Only: It's okay to shave around the incision but do not shave the incision itself for 2 weeks. It is common to have numbness under your chin that could last for several months.  Diet  Resume your normal diet. There are no special food restrictions following this procedure. A low fat/low cholesterol diet is recommended for all patients with vascular disease. In order to heal from your surgery, it is CRITICAL to get adequate nutrition. Your body requires vitamins, minerals, and protein. Vegetables are the best source of  vitamins and minerals. Vegetables also provide the perfect balance of protein. Processed food has little nutritional value, so try to avoid this.  Medications  Resume taking all of your medications unless your doctor or physician assistant tells you not to. If your incision is causing pain, you may take over-the- counter pain relievers such as acetaminophen  (Tylenol ). If you were prescribed a stronger pain medication, please be aware these medications can cause nausea and constipation. Prevent nausea by taking the medication with a snack or meal. Avoid constipation by drinking plenty of fluids and eating foods with a high amount of fiber, such as fruits, vegetables, and grains.  Do not take Tylenol  if you are taking prescription pain medications.  Follow Up  Our office will schedule a follow up appointment 2-3 weeks following discharge.  Please call us  immediately for any of the following conditions  Increased pain, redness, drainage (pus) from your incision site. Fever of 101 degrees or higher. If you should develop stroke (slurred speech, difficulty swallowing, weakness on one side of your body, loss of vision) you should call 911 and go to the nearest emergency room.  Reduce your risk of vascular disease:  Stop smoking. If you would like help call [facility] at 1-800-QUIT-NOW ((570) 047-3277) or [place] at (367)478-6854. Manage your cholesterol Maintain a desired weight Control your diabetes Keep your blood pressure down  If you have any questions, please call the office at [phone].

## 2024-05-22 NOTE — Anesthesia Procedure Notes (Signed)
 Procedure Name: Intubation Date/Time: 05/22/2024 7:44 AM  Performed by: Vanice Search, RNPre-anesthesia Checklist: Patient identified, Emergency Drugs available, Suction available and Patient being monitored Patient Re-evaluated:Patient Re-evaluated prior to induction Oxygen Delivery Method: Circle System Utilized Preoxygenation: Pre-oxygenation with 100% oxygen Induction Type: IV induction Laryngoscope Size: Mac and 4 Grade View: Grade I Tube type: Oral Tube size: 7.5 mm Number of attempts: 1 Airway Equipment and Method: Stylet and Oral airway Placement Confirmation: ETT inserted through vocal cords under direct vision, positive ETCO2 and breath sounds checked- equal and bilateral Secured at: 22 cm Tube secured with: Tape Dental Injury: Teeth and Oropharynx as per pre-operative assessment

## 2024-05-22 NOTE — Anesthesia Procedure Notes (Signed)
 Arterial Line Insertion Start/End9/10/2023 6:55 AM, 05/22/2024 7:00 AM Performed by: CRNA  Patient location: Pre-op. Preanesthetic checklist: patient identified, IV checked, site marked, risks and benefits discussed, surgical consent, monitors and equipment checked, pre-op evaluation, timeout performed and anesthesia consent Lidocaine  1% used for infiltration Right, radial was placed Hand hygiene performed  Allen's test indicative of satisfactory collateral circulation Attempts: 1 Procedure performed without using ultrasound guided technique. Following insertion, dressing applied and Biopatch. Post procedure assessment: normal and unchanged  Patient tolerated the procedure well with no immediate complications.

## 2024-05-22 NOTE — Progress Notes (Signed)
 Patient's A line blood pressure doesn't seems correlated with NIBP, confirmed with On call PA, okay to disconnect A line cord with transducer.

## 2024-05-22 NOTE — Interval H&P Note (Signed)
 History and Physical Interval Note:  05/22/2024 7:18 AM  [name]  has presented today for surgery, with the diagnosis of L CAROTID STENOSIS/INFECTION.  The various methods of treatment have been discussed with the patient and family. After consideration of risks, benefits and other options for treatment, the patient has consented to  Procedure(s) with comments: SURGICAL PROCUREMENT, VEIN (Left) - Saphenous vein harvest ENDARTERECTOMY, CAROTID (Left) - Revision of carotid patch and replacement with vein EXPLORATION, ARTERY (Left) - I&D and removal of Dacron Patch - L Carotid as a surgical intervention.  The patient's history has been reviewed, patient examined, no change in status, stable for surgery.  I have reviewed the patient's chart and labs.  Questions were answered to the patient's satisfaction.     Ethan Estrada

## 2024-05-22 NOTE — Transfer of Care (Signed)
 Immediate Anesthesia Transfer of Care Note  Patient: Ethan Estrada  Procedure(s) Performed: LEFT SAPHENOUS VEIN HARVEST (Left: Leg Upper) LEFT CAROTID ENDARTERECTOMY (Left: Neck) EXCISION OF LEFT CAROTID DACRON PATCH (Left: Neck) ANGIOPLASTY, USING SAPHENOUS VENOUS PATCH GRAFT (Left: Neck)  Patient Location: PACU  Anesthesia Type:General  Level of Consciousness: awake and oriented  Airway & Oxygen Therapy: Patient Spontanous Breathing and Patient connected to face mask oxygen  Post-op Assessment: Report given to RN, Post -op Vital signs reviewed and stable, and Patient moving all extremities  Post vital signs: Reviewed and stable  Last Vitals:  Vitals Value Taken Time  BP 191/94 05/22/24 11:30  Temp 99.66f   Pulse 100 05/22/24 11:33  Resp 14 05/22/24 11:33  SpO2 95 % 05/22/24 11:33  Vitals shown include unfiled device data.  Last Pain:  Vitals:   05/22/24 0609  TempSrc: Oral  PainSc: 0-No pain         Complications: No notable events documented.

## 2024-05-22 NOTE — Op Note (Signed)
 Patient name: Ethan Estrada MRN: [id number] DOB: 09-09-1946 Sex: male  05/22/2024 Pre-operative Diagnosis: History of left carotid endarterectomy with draining sinus Post-operative diagnosis:  Same Surgeon:  Penne BROCKS. Sheree, MD Assistant: Lucie Apt, PA Procedure Performed: 1.  Excision left carotid dacron patch via redo incision greater than 30 days 2.  Left carotid endarterectomy 3.  Harvest left greater saphenous vein  Indications: 78 year old male with history of left carotid endarterectomy initially performed for severe asymptomatic left carotid stenosis.  Over the past several months he has noted drainage from his left neck has undergone evaluation with both duplex and CTA which confirms draining sinus from the history of prosthetic patch.  He is now indicated for excision with placement of vein patch.  Experience assistant was necessary to facilitate difficult redo exposure of the carotid artery as well as patch as well as excision of the patch, redo endarterectomy, harvest of greater saphenous vein and patch angioplasty.  Findings: There was a draining sinus directly down to approximately the carotid bifurcation of the dacryon patch.  The dacryon patch medially appeared to have been dehisced and was in significant inflammatory tissue.  The vagus nerve and hypoglossal nerves were identified and protected.  The patch was totally excised and there was purulence draining down to this and this was sent for culture.  The saphenous vein in the left mid thigh was adequate for patch.  We did have to perform redo endarterectomy as there was significant calcification and thickening of the vessel.  At 2 areas we did have to tack the lesion due to the friable nature of the artery.  Distally there was smooth tapering of the lesion.  At completion there was strong expected Doppler flow signals distally in the ICA.  The patient was neurologically intact after waking from anesthesia.   Procedure:   The patient was identified in the holding area and taken to the operating room where was placed supine upon operative when general anesthesia was induced.  He was sterilely prepped and draped in the left neck and chest as well as left medial thigh in the usual fashion, antibiotics were administered and a timeout was called.  We began by reopening his previous incision first proximally and dissected down through the draining sinus.  The draining sinus was excised down to what was inflammatory tissue.  Proximally we were able to find tissue underlying the omohyoid muscle identified the vagus nerve here and encircled the common carotid artery with umbilical tape.  There was dense scar tissue working towards the carotid bifurcation were ultimately the sinus drain to.  There was a facial vein which was divided between ties.  Ultimately we were able to dissected higher than the carotid bulb where there was dense inflammatory tissue.  We identified the digastric muscle as well as another branch of the facial vein which was divided and ultimately the hypoglossal nerve was identified and protected.  The ansa cervicalis was divided between clips.  Identified the distal ICA and was able to get the vessel loop around this.  Working backwards we then tracked back down towards the carotid bifurcation identified the external carotid artery.  Superior thyroidal branch was divided between ties and the external carotid artery itself was isolated with vessel loop.  I then began dissecting around the carotid bifurcation very tediously using cautery and scissors to remove the attached IJ from the patch and the local inflammatory tissue.  When the carotid was sufficiently removed from the IJ we then turned  our attention towards vein harvest.  Ultrasound was used to identify what appeared to be a suitable vein in the mid thigh.  A large single incision was made we dissected down to the vein itself protected the saphenous nerve and then  transected this proximally distally and tied off both edges.  The vein was then flushed with heparinized saline and preserved on the back table.  At this time the patient was fully heparinized.  After heparinization we then clamped the ICA followed by the common carotid artery followed by the external carotid artery sequentially.  I opened from the common carotid artery onto the ICA using 11 blade followed by Potts scissors.  It appeared that the patch was dehisced medially.  I ultimately excised the entirety of the patch.  More cephalad there was significant disease within the carotid and this required redo endarterectomy.  2 areas I did have to tack as the vessel was very friable.  I ultimately was considering a bypass due to the friable nature of the artery but extended higher up onto the ICA and ultimately had normal-appearing ICA.  At this time opened the vein longitudinally and reversed it.  We then sewed this in place with 6-0 Prolene suture.  Prior to completion we allow flushing all directions and then thoroughly irrigated with heparinized saline.  Upon completion of the anastomosis the clamps were released first on the external carotid artery followed by the common and after several cardiac cycles the internal carotid arteries.  There was strong pulsatility and this was confirmed with Doppler with expected Doppler flow distally in the ICA.  50 mg of protamine  was administered to reverse heparinization.  We obtained meticulous hemostasis in the neck.  The thigh incision was closed with Vicryl Monocryl and Dermabond was placed at the skin level.  At the neck there was significant oozing that was controlled with combination of pressure and cautery.  We ultimately closed what appeared to be the platysma layer with running 2-0 Vicryl sutures and elected for staples at the skin level.  He was then awakened from anesthesia having tolerated the procedure well intermittent complication.  All counts were correct at  completion.   EBL: 200cc  Unknown Flannigan C. Sheree, MD [facility] Office: [phone] Pager: [phone]

## 2024-05-22 NOTE — Progress Notes (Signed)
 Patient received from PACU, he is alert and oriented, V/S obtained, CCMD notified, Chg bath given, NIHS0, all needs met, call bell in reach.   05/22/24 1738  Vitals  Temp 98 F (36.7 C)  Temp Source Oral  BP (!) 166/98  MAP (mmHg) 114  BP Location Right Arm  BP Method Automatic  Patient Position (if appropriate) Lying  Pulse Rate (!) 101  Pulse Rate Source Monitor  ECG Heart Rate (!) 102  Resp 12  Level of Consciousness  Level of Consciousness Alert  MEWS COLOR  MEWS Score Color Yellow  Oxygen Therapy  SpO2 97 %  O2 Device Room Air  Art Line  Arterial Line BP 188/82  Arterial Line MAP (mmHg) 112 mmHg  MEWS Score  MEWS Temp 0  MEWS Systolic 0  MEWS Pulse 1  MEWS RR 1  MEWS LOC 0  MEWS Score 2

## 2024-05-23 ENCOUNTER — Encounter (HOSPITAL_COMMUNITY): Payer: Self-pay | Admitting: Vascular Surgery

## 2024-05-23 LAB — LIPID PANEL
Cholesterol: 87 mg/dL (ref 0–200)
HDL: 31 mg/dL — ABNORMAL LOW (ref >40)
LDL Cholesterol: 43 mg/dL (ref 0–99)
Total CHOL/HDL Ratio: 2.8 ratio
Triglycerides: 65 mg/dL (ref <150)
VLDL: 13 mg/dL (ref 0–40)

## 2024-05-23 LAB — BASIC METABOLIC PANEL WITH GFR
Anion gap: 9 (ref 5–15)
BUN: 12 mg/dL (ref 8–23)
CO2: 23 mmol/L (ref 22–32)
Calcium: 8.2 mg/dL — ABNORMAL LOW (ref 8.9–10.3)
Chloride: 104 mmol/L (ref 98–111)
Creatinine, Ser: 1.02 mg/dL (ref 0.61–1.24)
GFR, Estimated: >60 mL/min (ref >60)
Glucose, Bld: 133 mg/dL — ABNORMAL HIGH (ref 70–99)
Potassium: 4.3 mmol/L (ref 3.5–5.1)
Sodium: 136 mmol/L (ref 135–145)

## 2024-05-23 LAB — CBC
HCT: 33.3 % — ABNORMAL LOW (ref 39.0–52.0)
Hemoglobin: 10.2 g/dL — ABNORMAL LOW (ref 13.0–17.0)
MCH: 24.5 pg — ABNORMAL LOW (ref 26.0–34.0)
MCHC: 30.6 g/dL (ref 30.0–36.0)
MCV: 80 fL (ref 80.0–100.0)
Platelets: 206 K/uL (ref 150–400)
RBC: 4.16 MIL/uL — ABNORMAL LOW (ref 4.22–5.81)
RDW: 18.2 % — ABNORMAL HIGH (ref 11.5–15.5)
WBC: 12.5 K/uL — ABNORMAL HIGH (ref 4.0–10.5)
nRBC: 0 % (ref 0.0–0.2)

## 2024-05-23 LAB — POCT ACTIVATED CLOTTING TIME
Activated Clotting Time: 239 s
Activated Clotting Time: 245 s
Activated Clotting Time: 239 s

## 2024-05-23 MED ORDER — OXYCODONE-ACETAMINOPHEN 5-325 MG PO TABS
1.0000 | ORAL_TABLET | Freq: Four times a day (QID) | ORAL | Status: DC | PRN
  Administered 2024-05-23: 1 via ORAL
  Filled 2024-05-23: qty 1

## 2024-05-23 MED ORDER — PROPOFOL 1000 MG/100ML IV EMUL
INTRAVENOUS | Status: AC
  Filled 2024-05-23: qty 200

## 2024-05-23 MED ORDER — PHENYLEPHRINE HCL-NACL 20-0.9 MG/250ML-% IV SOLN
INTRAVENOUS | Status: AC
  Filled 2024-05-23: qty 500

## 2024-05-23 NOTE — Evaluation (Cosign Needed)
 Clinical/Bedside Swallow Evaluation Patient Details  Name: Ethan Estrada MRN: [id number] Date of Birth: 08-12-1946  Today's Date: 05/23/2024 Time: SLP Start Time (ACUTE ONLY): 1636 SLP Stop Time (ACUTE ONLY): 1653 SLP Time Calculation (min) (ACUTE ONLY): 17 min  Past Medical History:  Past Medical History:  Diagnosis Date   Acute myocardial infarction, unspecified site, episode of care unspecified 1985   CAD (coronary artery disease)    Carotid artery occlusion    Cerebrovascular disease, unspecified    Coronary atherosclerosis of unspecified type of vessel, native or graft    GERD (gastroesophageal reflux disease)    Hemorrhoids    History of heart bypass surgery    MI (myocardial infarction) (HCC) 2010   Other and unspecified hyperlipidemia    Peripheral vascular disease (HCC)    Carotid Artery Stenosis   Renal hematoma 2005   Wears dentures    full set   Wears glasses    Past Surgical History:  Past Surgical History:  Procedure Laterality Date   ARTERY EXPLORATION Left 05/22/2024   Procedure: EXCISION OF LEFT CAROTID DACRON PATCH;  Surgeon: Sheree Penne Bruckner, MD;  Location: [facility] OR;  Service: Vascular;  Laterality: Left;   CERVICAL SPINE SURGERY  1995   COLONOSCOPY WITH ESOPHAGOGASTRODUODENOSCOPY (EGD)  yrs ago due 2022   CORONARY ARTERY BYPASS GRAFT  x 3   S/P in February of 2010. Procedure performed in [place]   ENDARTERECTOMY Left 10/12/2018   Procedure: ENDARTERECTOMY CAROTID LEFT;  Surgeon: Oris Krystal FALCON, MD;  Location: [facility] OR;  Service: Vascular;  Laterality: Left;   ENDARTERECTOMY Left 05/22/2024   Procedure: LEFT CAROTID ENDARTERECTOMY;  Surgeon: Sheree Penne Bruckner, MD;  Location: [facility] OR;  Service: Vascular;  Laterality: Left;   EYE SURGERY Bilateral    cataracts   HEMORRHOID SURGERY     HEMORRHOID SURGERY N/A 02/12/2021   Procedure: HEMORRHOIDECTOMY SINGLE COLUMN;  Surgeon: Debby Hila, MD;  Location: [facility];  Service:  General;  Laterality: N/A;   PATCH ANGIOPLASTY Left 05/22/2024   Procedure: ANGIOPLASTY, USING SAPHENOUS VENOUS PATCH GRAFT;  Surgeon: Sheree Penne Bruckner, MD;  Location: [facility] OR;  Service: Vascular;  Laterality: Left;   TONSILLECTOMY  as child   VEIN HARVEST Left 05/22/2024   Procedure: LEFT SAPHENOUS VEIN HARVEST;  Surgeon: Sheree Penne Bruckner, MD;  Location: [facility] OR;  Service: Vascular;  Laterality: Left;  Saphenous vein harvest   HPI:  Patient is an 78 y.o. male with history of left carotid endarterectomy (9/2) for asymptomatic these performed with dacron patch angioplasty. He initially noted swelling in his left neck a couple months ago and underwent duplex. No previous radiation to his neck no other neck surgeries were performed. Patient shares that he has noticed swelling since post-op. SLP order placed due to patient experiencing difficulty swallowing pills.    Assessment / Plan / Recommendation  Clinical Impression  Patient presents with clinical s/s of dysphagia as per this BSE. He was awake, alert, and participated fully in this evaluation. Patient was edentulous and shared that his wife has his dentures at home. He doesn't always eat with his dentures in, only with tough foods, such as peanuts or apples. OME not revealing any significant impairments. SLP assessed patient's swallow via PO's of thin liquids and purees. Swallow initiation was timely and no overt s/s aspiration observed with sips of water from straw. Patient ate 3 bites of applesauce and reported after the first and second bite that it felt tight to swallow. An immediate  cough was noted following his first bite of applesauce. After the third bite, patient stated that he was done and shared he was feeling anxious about swallowing. A tremor was noticied as he was sharing about his anxiety. Secure messaged patient's nurse to let her know that patient was experiencing some anxiety and was uncomfortable. SLP not making any  changes to current PO diet but recommending patient focus on thin liquids and POs such as applesauce/pudding if this feels comfortable to him. Will proceed with MBS next date. SLP Visit Diagnosis: Dysphagia, unspecified (R13.10)    Aspiration Risk  Mild aspiration risk    Diet Recommendation Regular;Thin liquid (Patient focus on thin liquids as comfortable.)    Liquid Administration via: Straw;Cup Medication Administration: Other (Comment) (as tolerated) Supervision: Patient able to self feed Postural Changes: Seated upright at 90 degrees;Remain upright for at least 30 minutes after po intake    Other  Recommendations Oral Care Recommendations: Oral care BID     Assistance Recommended at Discharge    Functional Status Assessment Patient has had a recent decline in their functional status and demonstrates the ability to make significant improvements in function in a reasonable and predictable amount of time.  Frequency and Duration min 1 x/week  1 week       Prognosis Prognosis for improved oropharyngeal function: Good      Swallow Study   General Date of Onset: 05/23/24 HPI: Patient is an 78 y.o. male with history of left carotid endarterectomy (9/2) for asymptomatic these performed with dacron patch angioplasty. He initially noted swelling in his left neck a couple months ago and underwent duplex. No previous radiation to his neck no other neck surgeries were performed. Patient shares that he has noticed swelling since post-op. SLP order placed due to patient experiencing difficulty swallowing pills. Type of Study: Bedside Swallow Evaluation Diet Prior to this Study: Regular;Thin liquids (Level 0) Temperature Spikes Noted: No Respiratory Status: Room air History of Recent Intubation: Yes Total duration of intubation (days): 1 days Date extubated: 05/22/24 Behavior/Cognition: Alert;Cooperative;Pleasant mood Oral Cavity Assessment: Within Functional Limits Oral Care Completed by  SLP: No Oral Cavity - Dentition: Dentures, not available (wife has dentures at home) Vision: Functional for self-feeding Self-Feeding Abilities: Able to feed self Patient Positioning: Upright in bed Baseline Vocal Quality: Hoarse Volitional Cough: Strong Volitional Swallow: Able to elicit    Oral/Motor/Sensory Function Overall Oral Motor/Sensory Function: Within functional limits   Ice Chips     Thin Liquid Thin Liquid: Within functional limits Presentation: Straw    Nectar Thick     Honey Thick     Puree Puree: Impaired Presentation: Self Fed Pharyngeal Phase Impairments: Cough - Immediate;Other (comments) (he commented that's tight when refering to swallowing the applesauce.)   Solid           Ayeisha Lindenberger  Graduate SLP Clinican

## 2024-05-23 NOTE — Plan of Care (Signed)
  Problem: Education: Goal: Knowledge of General Education information will improve Description: Including pain rating scale, medication(s)/side effects and non-pharmacologic comfort measures Outcome: Progressing   Problem: Health Behavior/Discharge Planning: Goal: Ability to manage health-related needs will improve Outcome: Progressing   Problem: Clinical Measurements: Goal: Will remain free from infection Outcome: Progressing   Problem: Coping: Goal: Level of anxiety will decrease Outcome: Progressing   Problem: Pain Managment: Goal: General experience of comfort will improve and/or be controlled Outcome: Progressing   Problem: Safety: Goal: Ability to remain free from injury will improve Outcome: Progressing

## 2024-05-23 NOTE — Progress Notes (Signed)
 PHARMACIST LIPID MONITORING   Ethan Estrada is a 78 y.o. male admitted on 05/22/2024 for excision of prior L carotid dacron patch via redo incision and L CEA with GSV patch angioplasty.  Pharmacy has been consulted to optimize lipid-lowering therapy with the indication of secondary prevention for clinical ASCVD.  Recent Labs:  Lipid Panel (last 6 months):   Lab Results  Component Value Date   CHOL 87 05/23/2024   TRIG 65 05/23/2024   HDL 31 (L) 05/23/2024   CHOLHDL 2.8 05/23/2024   VLDL 13 05/23/2024   LDLCALC 43 05/23/2024    Hepatic function panel (last 6 months):   Lab Results  Component Value Date   AST 24 05/17/2024   ALT 15 05/17/2024   ALKPHOS 73 05/17/2024   BILITOT 0.7 05/17/2024    SCr (since admission):   Serum creatinine: 1.02 mg/dL 90/96/74 9594 Estimated creatinine clearance: 64.1 mL/min  Current therapy and lipid therapy tolerance Current lipid-lowering therapy: Atorvastatin  80 mg and Ezetimibe  10 mg daily Previous lipid-lowering therapies (if applicable): n/a Documented or reported allergies or intolerances to lipid-lowering therapies (if applicable): none  Assessment:   LDL at goal  Plan:   No changes. Continue atorvastatin  80 mg and ezetimibe  10 mg daily. Repeat lipid panel in one year,  Ethan Estrada, [place] 05/23/2024, 10:00 AM

## 2024-05-23 NOTE — Progress Notes (Addendum)
  Progress Note    05/23/2024 6:37 AM 1 Day Post-Op  Subjective:  says his throat is dry but does not have any trouble swallowing.  He has voided but has not walked.  Wants to go home.  Says he got a shot for pain this morning and it helped.  Says he is moving everything normally  Afebrile HR 80's-100 NSR 140's-160's systolic 87-96% RA  Gtts:  none Vitals:   05/23/24 0400 05/23/24 0600  BP: (!) 140/59 (!) 143/88  Pulse: 97 100  Resp: 16 16  Temp: 97.9 F (36.6 C) 98.2 F (36.8 C)  SpO2: 92% 95%     Physical Exam: Neuro:  in tact; tongue is midline Lungs:  non labored Incision:  dressing removed and staples in tact; minimal swelling  CBC    Component Value Date/Time   WBC 12.5 (H) 05/23/2024 0405   RBC 4.16 (L) 05/23/2024 0405   HGB 10.2 (L) 05/23/2024 0405   HCT 33.3 (L) 05/23/2024 0405   PLT 206 05/23/2024 0405   MCV 80.0 05/23/2024 0405   MCH 24.5 (L) 05/23/2024 0405   MCHC 30.6 05/23/2024 0405   RDW 18.2 (H) 05/23/2024 0405   LYMPHSABS 1.3 08/29/2023 1359   MONOABS 0.7 08/29/2023 1359   EOSABS 0.5 08/29/2023 1359   BASOSABS 0.1 08/29/2023 1359    BMET    Component Value Date/Time   NA 136 05/23/2024 0405   NA 142 04/20/2017 0942   K 4.3 05/23/2024 0405   CL 104 05/23/2024 0405   CO2 23 05/23/2024 0405   GLUCOSE 133 (H) 05/23/2024 0405   BUN 12 05/23/2024 0405   BUN 9 04/20/2017 0942   CREATININE 1.02 05/23/2024 0405   CALCIUM  8.2 (L) 05/23/2024 0405   GFRNONAA >60 05/23/2024 0405   GFRAA >60 10/13/2018 0315     Intake/Output Summary (Last 24 hours) at 05/23/2024 0637 Last data filed at 05/23/2024 0347 Gross per 24 hour  Intake 2641.83 ml  Output 200 ml  Net 2441.83 ml     Assessment/Plan:  This is a 78 y.o. male who is s/p excision of left carotid dacron patch via redo incision and with GSV patch angioplasty  1 Day Post-Op  -pt is doing well this am.  Incision is clean with staples in tact without hematoma. -pt neuro exam is in tact -pt has  not ambulated-needs to ambulate in hallways -pt received IV pain med this am-needs to be able to tolerate po pain meds and have pain controlled prior to discharge -pt has voided -f/u with [facility] in 2-3 weeks on with PA or Dr. Sheree clinic day.     Lucie Apt, PA-C [facility] [phone]  I have independently interviewed and examined patient and agree with PA assessment and plan above. Healing well, neuro in tact. Surgery yesterday was involved and he is having corresponding pain. Will ambulate today and if he tolerates solid food with pain controlled po meds will be ok for dc.   Kashayla Ungerer C. Sheree, MD [facility] [place] Office: [phone] Pager: [phone]

## 2024-05-23 NOTE — Discharge Summary (Signed)
 Discharge Summary     Ethan Estrada 1945-12-16 78 y.o. male  [id number]  Admission Date: 05/22/2024  Discharge Date: 05/25/2024  Physician: No att. 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 male with history of left carotid endarterectomy initially performed for severe asymptomatic left carotid stenosis. Over the past several months he has noted drainage from his left neck has undergone evaluation with both duplex and CTA which confirms draining sinus from the history of prosthetic patch. He is now indicated for excision with placement of vein patch.   Hospital Course:  The patient was admitted to the hospital and taken to the operating room on 05/22/2024 and underwent: 1.  Excision left carotid dacron patch via redo incision greater than 30 days 2.  Left carotid endarterectomy 3.  Harvest left greater saphenous vein    Findings: There was a draining sinus directly down to approximately the carotid bifurcation of the dacryon patch. The dacryon patch medially appeared to have been dehisced and was in significant inflammatory tissue. The vagus nerve and hypoglossal nerves were identified and protected. The patch was totally excised and there was purulence draining down to this and this was sent for culture. The saphenous vein in the left mid thigh was adequate for patch. We did have to perform redo endarterectomy as there was significant calcification and thickening of the vessel. At 2 areas we did have to tack the lesion due to the friable nature of the artery. Distally there was smooth tapering of the lesion. At completion there was strong expected Doppler flow signals distally in the ICA. The patient was neurologically intact after waking from anesthesia.   The pt tolerated the procedure well and was transported to the PACU in good condition.   By POD 1, the pt neuro  status was intact.  His incision was clean and intact without hematoma.  He was able to void and tolerate solids without difficulty.  Upon evaluation later that afternoon, pt was having uncontrolled pain and mild swallowing issues and therefore not discharged home.    POD 2, he had a barium swallow study as follows:   Clinical Impression Pt presents with functional oropharyngeal swallow with primary deficit related to edema that impairs full patency of pharyngo-esophageal segment.  A portion of heavier barium tended to lodge between hypertrophied cricopharyngeus and opening of UES - it was a minimal amount of residue, but tightness would explain lodging of pills or tough meats/breads in this area.     Laryngeal vestibule closure was reliable (only one instance of transient penetration of thin liquids - PAS score of 2, considered WFL). There was no aspiration. Pharyngeal contraction was sufficient to clear the pharynx.  Mild residue above valleculae.    Mr. Creps viewed study in real time; we discussed results, including potential for more swelling before this reverses direction.  Recommend he stay on regular diet/thin liquids, but avoid tough/dry foods.  Meds should be crushed or broken into smaller pieces if possible.  If food lodges, use liquid wash to help with clearance.  As edema reduces, he should be able to resume prior diet. Pt agrees with plan. No further SLP f/u needed.     Factors that may increase risk of adverse event in presence of aspiration Noe & Lianne 2021):     Swallow Evaluation Recommendations Recommendations: PO diet PO Diet Recommendation: Regular;Thin liquids (Level 0) Liquid Administration  via: Straw;Cup Medication Administration: Crushed with puree Swallowing strategies  : Follow solids with liquids Oral care recommendations: Oral care BID (2x/day)  POD 3, Swelling much improved in his neck and he is swallowing better. Remains mildly hoarse. Okay for discharge  today will follow-up for staple removal in a couple weeks.   He is discharged home.   Recent Labs    05/24/24 0339 05/25/24 0309  NA 140 140  K 4.6 4.5  CL 105 104  CO2 25 24  GLUCOSE 126* 116*  BUN 13 11  CALCIUM  8.3* 8.2*   Recent Labs    05/23/24 0405  WBC 12.5*  HGB 10.2*  HCT 33.3*  PLT 206   No results for input(s): INR in the last 72 hours.   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   Aortic atherosclerosis (HCC) 12/24/2020   Benign prostatic hyperplasia with nocturia 12/11/2019   Asymptomatic carotid artery stenosis without infarction, left 10/12/2018   Stenosis of left carotid artery 07/28/2018   Insomnia 07/28/2018   Tobacco abuse 07/15/2010   CAD, ARTERY BYPASS GRAFT 02/19/2009   HYPERLIPIDEMIA-MIXED 02/18/2009   MI 02/18/2009   Coronary atherosclerosis 02/18/2009   Cerebrovascular disease 02/18/2009   WEAKNESS 02/18/2009   DIAPHORESIS 02/18/2009   Past Medical History:  Diagnosis Date   Acute myocardial infarction, unspecified site, episode of care unspecified 1985   CAD (coronary artery disease)    Carotid artery occlusion    Cerebrovascular disease, unspecified    Coronary atherosclerosis of unspecified type of vessel, native or graft    GERD (gastroesophageal reflux disease)    Hemorrhoids    History of heart bypass surgery    MI (myocardial infarction) (HCC) 2010   Other and unspecified hyperlipidemia    Peripheral vascular disease (HCC)    Carotid Artery Stenosis   Renal hematoma 2005   Wears dentures    full set   Wears glasses     Allergies as of 05/25/2024   No  Known Allergies      Medication List     TAKE these medications    aspirin  81 MG tablet Take 1 tablet (81 mg total) by mouth daily.   atorvastatin  80 MG tablet Commonly known as: LIPITOR  TAKE 1 TABLET EVERY DAY AT 6PM (PLEASE KEEP JANUARY CARDIOLOGY APPOINTMENT FOR REFILLS)   ezetimibe  10 MG tablet Commonly known as: ZETIA  TAKE 1 TABLET EVERY DAY What changed: when to take this   HYDROcodone -acetaminophen  5-325 MG tablet Commonly known as: NORCO/VICODIN Take 1 tablet by mouth every 6 (six) hours as needed for moderate pain (pain score 4-6).   metoprolol  tartrate 50 MG tablet Commonly known as: LOPRESSOR  TAKE 1 TABLET TWICE DAILY   omeprazole 20 MG capsule Commonly known as: PRILOSEC Take 20 mg by mouth daily.   tadalafil  5 MG tablet Commonly known as: CIALIS  Take 1 tablet by mouth once daily What changed: when to take this   zolpidem  10 MG tablet Commonly known as: AMBIEN  Take 1 tablet (10 mg total) by mouth at bedtime as needed for sleep. What changed: when to take this  [facility] Discharge Instructions Carotid Endarterectomy (CEA)  Please refer to the following instructions for your post-procedure care. Your surgeon or physician assistant will discuss any changes with you.  Activity  You are encouraged to walk as much as you can. You can slowly return to normal activities but must avoid strenuous activity and heavy lifting until your doctor tell you it's OK. Avoid activities such as vacuuming or swinging a golf club. You can drive after one week if you are comfortable and you are no longer taking prescription pain medications. It is normal to feel tired for serval weeks after your surgery. It is also normal to have difficulty with sleep habits, eating, and bowel movements after surgery. These will go away with time.  Bathing/Showering  You may shower after you come home. Do not soak in a bathtub, hot tub, or swim  until the incision heals completely.  Incision Care  Shower every day. Clean your incision with mild soap and water. Pat the area dry with a clean towel. You do not need a bandage unless otherwise instructed. Do not apply any ointments or creams to your incision. You may have skin glue on your incision. Do not peel it off. It will come off on its own in about one week. Your incision may feel thickened and raised for several weeks after your surgery. This is normal and the skin will soften over time. For Men Only: It's OK to shave around the incision but do not shave the incision itself for 2 weeks. It is common to have numbness under your chin that could last for several months.  Diet  Resume your normal diet. There are no special food restrictions following this procedure. A low fat/low cholesterol diet is recommended for all patients with vascular disease. In order to heal from your surgery, it is CRITICAL to get adequate nutrition. Your body requires vitamins, minerals, and protein. Vegetables are the best source of vitamins and minerals. Vegetables also provide the perfect balance of protein. Processed food has little nutritional value, so try to avoid this.  Medications  Resume taking all of your medications unless your doctor or physician assistant tells you not to.  If your incision is causing pain, you may take over-the- counter pain relievers such as acetaminophen  (Tylenol ). If you were prescribed a stronger pain medication, please be aware these medications can cause nausea and constipation.  Prevent nausea by taking the medication with a snack or meal. Avoid constipation by drinking plenty of fluids and eating foods with a high amount of fiber, such as fruits, vegetables, and grains.  Do not take Tylenol  if you are taking prescription pain medications.  Follow Up  Our office will schedule a follow up appointment 2-3 weeks following discharge.  Please call us  immediately for any of the  following conditions  Increased pain, redness, drainage (pus) from your incision site. Fever of 101 degrees or higher. If you should develop stroke (slurred speech, difficulty swallowing, weakness on one side of your body, loss of vision) you should call 911 and go to the nearest emergency room.  Reduce your risk of vascular disease:  Stop smoking. If you would like help call [facility] at 1-800-QUIT-NOW (7067032041) or [place] at 701-400-9354. Manage your cholesterol Maintain a desired weight Control your diabetes Keep your blood pressure down  If you have any questions, please call the office at [phone].  Prescriptions given: 1.   Roxicet #12 No Refill  Disposition: home  Patient's condition: is Good  Follow up: 1. [facility] in 2-3 weeks with Dr. Sheree or PA on Dr. Sheree clinic day.   Lucie Apt, PA-C [facility] [phone]   --- For [facility] use ---   Modified Rankin score at D/C (0-6): 0  IV medication needed for:  1. Hypertension: No 2. Hypotension: No  Post-op Complications: No  1. Post-op CVA or TIA: No  If yes: Event classification (right eye, left eye, right cortical, left cortical, verterobasilar, other): n/a  If yes: Timing of event (intra-op, <6 hrs post-op, >=6 hrs post-op, unknown): n/a  2. CN injury: No  If yes: CN n/a injuried   3. Myocardial infarction: No  If yes: Dx by (EKG or clinical, Troponin): n/a  4.  CHF: No  5.  Dysrhythmia (new): No  6. Wound infection: No  7. Reperfusion symptoms: No  8. Return to OR: No  If yes: return to OR for (bleeding, neurologic, other CEA incision, other): n/a  Discharge medications: Statin use:  Yes ASA use:  Yes   Beta blocker use:  Yes ACE-Inhibitor use:  No  ARB use:  No CCB use: No P2Y12 Antagonist use: No, [ ]  Plavix, [ ]  Plasugrel, [ ]  Ticlopinine, [ ]  Ticagrelor, [ ]  Other, [ ]  No for medical reason, [ ]  Non-compliant, [ ]   Not-indicated Anti-coagulant use:  No, [ ]  Warfarin, [ ]  Rivaroxaban, [ ]  Dabigatran,

## 2024-05-24 ENCOUNTER — Inpatient Hospital Stay (HOSPITAL_COMMUNITY)

## 2024-05-24 LAB — BASIC METABOLIC PANEL WITH GFR
Anion gap: 10 (ref 5–15)
BUN: 13 mg/dL (ref 8–23)
CO2: 25 mmol/L (ref 22–32)
Calcium: 8.3 mg/dL — ABNORMAL LOW (ref 8.9–10.3)
Chloride: 105 mmol/L (ref 98–111)
Creatinine, Ser: 1.18 mg/dL (ref 0.61–1.24)
GFR, Estimated: >60 mL/min (ref >60)
Glucose, Bld: 126 mg/dL — ABNORMAL HIGH (ref 70–99)
Potassium: 4.6 mmol/L (ref 3.5–5.1)
Sodium: 140 mmol/L (ref 135–145)

## 2024-05-24 MED ORDER — PANTOPRAZOLE SODIUM 40 MG IV SOLR
40.0000 mg | Freq: Every day | INTRAVENOUS | Status: DC
  Administered 2024-05-24: 40 mg via INTRAVENOUS
  Filled 2024-05-24: qty 10

## 2024-05-24 MED ORDER — NICOTINE 21 MG/24HR TD PT24
21.0000 mg | MEDICATED_PATCH | Freq: Every day | TRANSDERMAL | Status: DC
  Administered 2024-05-24: 21 mg via TRANSDERMAL
  Filled 2024-05-24 (×2): qty 1

## 2024-05-24 MED ORDER — HALOPERIDOL LACTATE 5 MG/ML IJ SOLN: 1.0000 mg | Freq: Two times a day (BID) | INTRAMUSCULAR | Status: DC | PRN

## 2024-05-24 NOTE — Progress Notes (Addendum)
  Progress Note    05/24/2024 6:39 AM 2 Days Post-Op  Subjective:  did not discharge yesterday. Nurse reported pain and trouble swallowing.  He states his pain is gone this morning and is just sore.  He states he got choked on the aspirin .  They gave him pills later in apple sauce without difficulty.  He states he did not eat otherwise yesterday.  Does not want anything to eat here. Denies any trouble swallowing liquids.    Afebrile HR 90's-110's  150's-170's systolic 96% RA  Vitals:   05/23/24 2300 05/24/24 0440  BP: (!) 159/62 (!) 169/67  Pulse: 96   Resp: 17 19  Temp: 98.5 F (36.9 C) 98.4 F (36.9 C)  SpO2: 91% 96%    Physical Exam: General:  no distress Lungs:  non labored Incisions:  clean with staples in tact   CBC    Component Value Date/Time   WBC 12.5 (H) 05/23/2024 0405   RBC 4.16 (L) 05/23/2024 0405   HGB 10.2 (L) 05/23/2024 0405   HCT 33.3 (L) 05/23/2024 0405   PLT 206 05/23/2024 0405   MCV 80.0 05/23/2024 0405   MCH 24.5 (L) 05/23/2024 0405   MCHC 30.6 05/23/2024 0405   RDW 18.2 (H) 05/23/2024 0405   LYMPHSABS 1.3 08/29/2023 1359   MONOABS 0.7 08/29/2023 1359   EOSABS 0.5 08/29/2023 1359   BASOSABS 0.1 08/29/2023 1359    BMET    Component Value Date/Time   NA 140 05/24/2024 0339   NA 142 04/20/2017 0942   K 4.6 05/24/2024 0339   CL 105 05/24/2024 0339   CO2 25 05/24/2024 0339   GLUCOSE 126 (H) 05/24/2024 0339   BUN 13 05/24/2024 0339   BUN 9 04/20/2017 0942   CREATININE 1.18 05/24/2024 0339   CALCIUM  8.3 (L) 05/24/2024 0339   GFRNONAA >60 05/24/2024 0339   GFRAA >60 10/13/2018 0315    INR    Component Value Date/Time   INR 1.1 05/17/2024 1408    No intake or output data in the 24 hours ending 05/24/24 9360    Assessment/Plan:  78 y.o. male is s/p:  s/p excision of left carotid dacron patch via redo incision and with GSV patch angioplasty   2 Days Post-Op   -pt neuro in tact.  Does not want to eat, but discussed he needs to  eat this morning to ensure he can swallow prior to discharge.  Pain much more controlled today. -home later today if no difficulty swallowing.  Discussed this with pt.  He also needs to walk in hallways as he did not do that yesterday either.    Lucie Apt, PA-C [facility] [phone] 05/24/2024 6:39 AM  I have independently interviewed and examined patient and agree with PA assessment and plan above. Plan for barium swallow for further evaluation today.   Ishaq Maffei C. Sheree, MD [facility] [place] Office: [phone] Pager: [phone]

## 2024-05-24 NOTE — Evaluation (Signed)
 Modified Barium Swallow Study  Patient Details  Name: Ethan Estrada MRN: [id number] Date of Birth: May 15, 1946  Today's Date: 05/24/2024  Modified Barium Swallow completed.  Full report located under Chart Review in the Imaging Section.  History of Present Illness 78 year old male presented for left CEA, excision left carotid dacron patch via redo on 9/2. Presented to vascular surgery as OP due to several months of drainage from left neck. Pt reports some trouble swallowing solid foods/pills; no difficulty with liquids. PMHx L CEA for severe asymptomatic left ICA stenosis 09/2018, HLD, CABG 2010.   Clinical Impression Pt presents with functional oropharyngeal swallow with primary deficit related to edema that impairs full patency of pharyngo-esophageal segment.  A portion of heavier barium tended to lodge between hypertrophied cricopharyngeus and opening of UES - it was a minimal amount of residue, but tightness would explain lodging of pills or tough meats/breads in this area.    Laryngeal vestibule closure was reliable (only one instance of transient penetration of thin liquids - PAS score of 2, considered WFL). There was no aspiration. Pharyngeal contraction was sufficient to clear the pharynx.  Mild residue above valleculae.   Ethan Estrada viewed study in real time; we discussed results, including potential for more swelling before this reverses direction.  Recommend he stay on regular diet/thin liquids, but avoid tough/dry foods.  Meds should be crushed or broken into smaller pieces if possible.  If food lodges, use liquid wash to help with clearance.  As edema reduces, he should be able to resume prior diet. Pt agrees with plan. No further SLP f/u needed.   Factors that may increase risk of adverse event in presence of aspiration Noe & Lianne 2021):    Swallow Evaluation Recommendations Recommendations: PO diet PO Diet Recommendation: Regular;Thin liquids (Level 0) Liquid  Administration via: Straw;Cup Medication Administration: Crushed with puree Swallowing strategies  : Follow solids with liquids Oral care recommendations: Oral care BID (2x/day)    Ethan Heo L. Vona, MA CCC/SLP Clinical Specialist - Acute Care SLP [facility] Office number [phone]   Ethan Estrada 05/24/2024,10:16 AM

## 2024-05-24 NOTE — Plan of Care (Signed)
  Problem: Safety: Goal: Ability to remain free from injury will improve Outcome: Progressing   Problem: Skin Integrity: Goal: Risk for impaired skin integrity will decrease Outcome: Progressing   Problem: Education: Goal: Knowledge of discharge needs will improve Outcome: Progressing   Problem: Nutrition: Goal: Adequate nutrition will be maintained Outcome: Not Progressing   Problem: Coping: Goal: Level of anxiety will decrease Outcome: Not Progressing   Problem: Pain Managment: Goal: General experience of comfort will improve and/or be controlled Outcome: Not Progressing

## 2024-05-25 LAB — BASIC METABOLIC PANEL WITH GFR
Anion gap: 12 (ref 5–15)
BUN: 11 mg/dL (ref 8–23)
CO2: 24 mmol/L (ref 22–32)
Calcium: 8.2 mg/dL — ABNORMAL LOW (ref 8.9–10.3)
Chloride: 104 mmol/L (ref 98–111)
Creatinine, Ser: 1.1 mg/dL (ref 0.61–1.24)
GFR, Estimated: >60 mL/min (ref >60)
Glucose, Bld: 116 mg/dL — ABNORMAL HIGH (ref 70–99)
Potassium: 4.5 mmol/L (ref 3.5–5.1)
Sodium: 140 mmol/L (ref 135–145)

## 2024-05-25 MED ORDER — HYDROCODONE-ACETAMINOPHEN 5-325 MG PO TABS: 1.0000 | ORAL_TABLET | Freq: Four times a day (QID) | ORAL | 0 refills | Status: DC | PRN

## 2024-05-25 NOTE — Progress Notes (Addendum)
  Progress Note    05/25/2024 6:42 AM 3 Days Post-Op  Subjective:  standing at bedside; says he is feeling much better.  Ate a biscuit yesterday without choking.  Wants to go home.   Afebrile HR 80's -130's (improved overnight to 80's-90's) 150's-170's systolic 96% RA  Vitals:   05/24/24 2252 05/25/24 0414  BP: (!) 171/76 (!) 150/63  Pulse: (!) 109 94  Resp: 20 17  Temp: 98.4 F (36.9 C) 98.3 F (36.8 C)  SpO2: 93% 96%    Physical Exam: General:  no distress Lungs:  non labored Incisions:  clean with staples in tact   CBC    Component Value Date/Time   WBC 12.5 (H) 05/23/2024 0405   RBC 4.16 (L) 05/23/2024 0405   HGB 10.2 (L) 05/23/2024 0405   HCT 33.3 (L) 05/23/2024 0405   PLT 206 05/23/2024 0405   MCV 80.0 05/23/2024 0405   MCH 24.5 (L) 05/23/2024 0405   MCHC 30.6 05/23/2024 0405   RDW 18.2 (H) 05/23/2024 0405   LYMPHSABS 1.3 08/29/2023 1359   MONOABS 0.7 08/29/2023 1359   EOSABS 0.5 08/29/2023 1359   BASOSABS 0.1 08/29/2023 1359    BMET    Component Value Date/Time   NA 140 05/25/2024 0309   NA 142 04/20/2017 0942   K 4.5 05/25/2024 0309   CL 104 05/25/2024 0309   CO2 24 05/25/2024 0309   GLUCOSE 116 (H) 05/25/2024 0309   BUN 11 05/25/2024 0309   BUN 9 04/20/2017 0942   CREATININE 1.10 05/25/2024 0309   CALCIUM  8.2 (L) 05/25/2024 0309   GFRNONAA >60 05/25/2024 0309   GFRAA >60 10/13/2018 0315    INR    Component Value Date/Time   INR 1.1 05/17/2024 1408     Intake/Output Summary (Last 24 hours) at 05/25/2024 9357 Last data filed at 05/24/2024 1007 Gross per 24 hour  Intake --  Output 1000 ml  Net -1000 ml      Assessment/Plan:  78 y.o. male is s/p:  excision of left carotid dacron patch via redo incision and with GSV patch angioplasty 05/22/2024 by Dr. Sheree  3 Days Post-Op   -pt subjectively feels better this morning.  Says he ate a biscuit yesterday and did well without choking.   Tachycardia improved this am from last evening.   -intraoperative cultures no growth to date -discharge home today -does not want Oxycodone , will send Norco to his pharmacy.  Discussed with him that if he does not need the pain medication, he does not need to pick it up.    Lucie Apt, PA-C [facility] [phone] 05/25/2024 6:42 AM  I have independently interviewed and examined patient and agree with PA assessment and plan above.  Swelling much improved in his neck and he is swallowing better.  Remains mildly hoarse.  Okay for discharge today will follow-up for staple removal in a couple weeks.  Jeree Delcid C. Sheree, MD [facility] [place] Office: [phone] Pager: [phone]

## 2024-05-25 NOTE — Progress Notes (Signed)
 Mobility Specialist Progress Note:    05/25/24 0915  Mobility  Activity Ambulated independently  Level of Assistance Independent after set-up  Assistive Device None  Distance Ambulated (ft) 470 ft  Activity Response Tolerated well  Mobility Referral Yes  Mobility visit 1 Mobility  Mobility Specialist Start Time (ACUTE ONLY) 0915  Mobility Specialist Stop Time (ACUTE ONLY) [id number]  Mobility Specialist Time Calculation (min) (ACUTE ONLY) 7 min   Pt received in bed, eager for mobility session. Ambulated in hallway, no AD. Tolerated well, denies pain or dizziness. Returned pt to room, eager for d/c. All needs met.   Pre Mobility: 117 bpm Post Mobility: 123 bpm   Ethan Estrada Mobility Specialist Please contact via Special educational needs teacher or  Rehab office at [phone]

## 2024-05-25 NOTE — Plan of Care (Signed)

## 2024-05-25 NOTE — Progress Notes (Signed)
 Discharge Nurse Summary: DC order noted per MD. DC RN at bedside with patient. Patient agreeable with discharge plan, states family will arrive soon for pickup.   AVS printed/reviewed. PIV removed, skin intact. No DME needs. No home/TOC meds. CP/Edu resolved. Telemonitor not present on assessment. All belongings accounted for. Wounds CDI w/o bleeding or drainage. See LDAs for more information. Patient wheeled downstairs for discharge with volunteer transport to private auto.   Rosario EMERSON Lund, RN

## 2024-05-25 NOTE — Progress Notes (Signed)
   05/25/24 1021  TOC Brief Assessment  Insurance and Status Reviewed  Patient has primary care physician Yes  Home environment has been reviewed home w/ spouse  Prior level of function: independent  Prior/Current Home Services No current home services  Social Drivers of Health Review SDOH reviewed no interventions necessary  Readmission risk has been reviewed Yes  Transition of care needs no transition of care needs at this time    Pt stable for transition home today, no HH or DME needs noted. Pt to follow up as per AVS instructions.

## 2024-05-25 NOTE — Care Management Important Message (Signed)
 Important Message  Patient Details  Name: Ethan Estrada MRN: [id number] Date of Birth: [birth year]   Important Message Given:  Yes - Medicare IM     Ethan Estrada 05/25/2024, 1:06 PM

## 2024-05-26 NOTE — Anesthesia Postprocedure Evaluation (Signed)
 Anesthesia Post Note  Patient: Ethan Estrada  Procedure(s) Performed: LEFT SAPHENOUS VEIN HARVEST (Left: Leg Upper) LEFT CAROTID ENDARTERECTOMY (Left: Neck) EXCISION OF LEFT CAROTID DACRON PATCH (Left: Neck) ANGIOPLASTY, USING SAPHENOUS VENOUS PATCH GRAFT (Left: Neck)     Patient location during evaluation: PACU Anesthesia Type: General Level of consciousness: awake and alert Pain management: pain level controlled Vital Signs Assessment: post-procedure vital signs reviewed and stable Respiratory status: spontaneous breathing, nonlabored ventilation and respiratory function stable Cardiovascular status: blood pressure returned to baseline and stable Postop Assessment: no apparent nausea or vomiting Anesthetic complications: no   There were no known notable events for this encounter.                  Carl Butner

## 2024-05-27 LAB — AEROBIC/ANAEROBIC CULTURE W GRAM STAIN (SURGICAL/DEEP WOUND)
Culture: NO GROWTH
Gram Stain: NONE SEEN
Gram Stain: NONE SEEN

## 2024-06-13 ENCOUNTER — Ambulatory Visit: Attending: Vascular Surgery | Admitting: Physician Assistant

## 2024-06-13 ENCOUNTER — Encounter: Payer: Self-pay | Admitting: Physician Assistant

## 2024-06-13 VITALS — BP 145/71 | HR 75 | Temp 98.4°F | Wt 212.7 lb

## 2024-06-13 DIAGNOSIS — I6522 Occlusion and stenosis of left carotid artery: Secondary | ICD-10-CM

## 2024-06-13 NOTE — Progress Notes (Signed)
  POST OPERATIVE OFFICE NOTE    CC:  F/u for surgery  HPI:  Ethan Estrada a 78 y.o. male who is here for postop visit.  He recently underwent excision of a left carotid dacryon patch, redo left carotid endarterectomy, harvest of left greater saphenous vein, and carotid patch angioplasty with vein on 05/22/2024 by Dr. Sheree.  This was done for dehiscence of a previous dacryon patch with a draining sinus.   He returns today for follow-up.  He says that he is doing great at today's office visit.  He denies any pain at his incision sites.  He denies any fevers, drainage from his incisions, tenderness, or erythema.  He has been tolerating a normal diet without swallowing difficulty.  He denies any strokelike symptoms such as slurred speech, facial droop, sudden weakness/numbness, or sudden visual changes.   No Known Allergies  Current Outpatient Medications  Medication Sig Dispense Refill   aspirin  81 MG tablet Take 1 tablet (81 mg total) by mouth daily.     atorvastatin  (LIPITOR ) 80 MG tablet TAKE 1 TABLET EVERY DAY AT 6PM (PLEASE KEEP JANUARY CARDIOLOGY APPOINTMENT FOR REFILLS) 90 tablet 3   ezetimibe  (ZETIA ) 10 MG tablet TAKE 1 TABLET EVERY DAY (Patient taking differently: Take 10 mg by mouth daily at 6 PM.) 90 tablet 1   metoprolol  tartrate (LOPRESSOR ) 50 MG tablet TAKE 1 TABLET TWICE DAILY 180 tablet 3   omeprazole (PRILOSEC) 20 MG capsule Take 20 mg by mouth daily.     tadalafil  (CIALIS ) 5 MG tablet Take 1 tablet by mouth once daily 90 tablet 0   zolpidem  (AMBIEN ) 10 MG tablet Take 1 tablet (10 mg total) by mouth at bedtime as needed for sleep. (Patient taking differently: Take 10 mg by mouth at bedtime.) 90 tablet 1   HYDROcodone -acetaminophen  (NORCO/VICODIN) 5-325 MG tablet Take 1 tablet by mouth every 6 (six) hours as needed for moderate pain (pain score 4-6). (Patient not taking: Reported on 06/13/2024) 8 tablet 0   No current facility-administered medications for this visit.     ROS:   See HPI  Physical Exam:  Incision: Left-sided neck incision well-healed without signs of infection or hematoma, all staples removed.  Left upper thigh incision well-healed without signs of infection Extremities: Palpable radial pulses bilaterally Neuro: Moving all extremities equally without sensory/motor deficit, no slurred speech or tongue deviation    Assessment/Plan:  This is a 78 y.o. male who is here for a postop visit  - The patient recently underwent redo left carotid endarterectomy with patch angioplasty with left greater saphenous vein for a draining sinus from a previous dacryon patch - His left-sided neck incision is now well-healed without signs of infection or hematoma.  He has had no drainage from this incision.  I have removed all of his staples without issue - His left thigh saphenectomy incision is well-healed without signs of infection or hematoma -He remains neurologically intact on exam.  He denies any slurred speech, facial droop, sudden weakness/numbness, or sudden visual changes - He can follow-up with Dr. Sheree in 3 months with carotid duplex   Ahmed Holster, PA-C [facility] [phone]   Clinic MD:  Sheree

## 2024-06-14 ENCOUNTER — Other Ambulatory Visit: Payer: Self-pay | Admitting: *Deleted

## 2024-06-14 DIAGNOSIS — I6522 Occlusion and stenosis of left carotid artery: Secondary | ICD-10-CM

## 2024-07-23 ENCOUNTER — Other Ambulatory Visit: Payer: Self-pay | Admitting: Cardiology

## 2024-08-24 ENCOUNTER — Ambulatory Visit (INDEPENDENT_AMBULATORY_CARE_PROVIDER_SITE_OTHER): Admitting: Family Medicine

## 2024-08-24 ENCOUNTER — Encounter: Payer: Self-pay | Admitting: Family Medicine

## 2024-08-24 VITALS — BP 130/74 | HR 91 | Temp 98.0°F | Resp 16 | Ht 67.0 in | Wt 215.4 lb

## 2024-08-24 DIAGNOSIS — Z0000 Encounter for general adult medical examination without abnormal findings: Secondary | ICD-10-CM | POA: Diagnosis not present

## 2024-08-24 DIAGNOSIS — I2510 Atherosclerotic heart disease of native coronary artery without angina pectoris: Secondary | ICD-10-CM

## 2024-08-24 DIAGNOSIS — G4700 Insomnia, unspecified: Secondary | ICD-10-CM | POA: Diagnosis not present

## 2024-08-24 MED ORDER — ZOLPIDEM TARTRATE 10 MG PO TABS: 10.0000 mg | ORAL_TABLET | Freq: Every evening | ORAL | 1 refills | Status: DC | PRN

## 2024-08-24 NOTE — Progress Notes (Signed)
 Chief Complaint  Patient presents with   Annual Exam    CPE    Well Male Ethan Estrada is here for a complete physical.   His last physical was >1 year ago.  Current diet: in general, diet is fair   Current exercise: none Weight trend: stable Fatigue out of ordinary? No. Seat belt? Yes.   Advanced directive? Yes  Health maintenance Shingrix- No Tetanus- No Pneumonia vaccine- Yes  Past Medical History:  Diagnosis Date   Acute myocardial infarction, unspecified site, episode of care unspecified 1985   CAD (coronary artery disease)    Carotid artery occlusion    Cerebrovascular disease, unspecified    Coronary atherosclerosis of unspecified type of vessel, native or graft    GERD (gastroesophageal reflux disease)    Hemorrhoids    History of heart bypass surgery    MI (myocardial infarction) (HCC) 2010   Other and unspecified hyperlipidemia    Peripheral vascular disease    Carotid Artery Stenosis   Renal hematoma 2005   Wears dentures    full set   Wears glasses      Past Surgical History:  Procedure Laterality Date   ARTERY EXPLORATION Left 05/22/2024   Procedure: EXCISION OF LEFT CAROTID DACRON PATCH;  Surgeon: Sheree Penne Bruckner, MD;  Location: [facility] OR;  Service: Vascular;  Laterality: Left;   CERVICAL SPINE SURGERY  1995   COLONOSCOPY WITH ESOPHAGOGASTRODUODENOSCOPY (EGD)  yrs ago due 2022   CORONARY ARTERY BYPASS GRAFT  x 3   S/P in February of 2010. Procedure performed in [place]   ENDARTERECTOMY Left 10/12/2018   Procedure: ENDARTERECTOMY CAROTID LEFT;  Surgeon: Oris Krystal FALCON, MD;  Location: [facility] OR;  Service: Vascular;  Laterality: Left;   ENDARTERECTOMY Left 05/22/2024   Procedure: LEFT CAROTID ENDARTERECTOMY;  Surgeon: Sheree Penne Bruckner, MD;  Location: [facility] OR;  Service: Vascular;  Laterality: Left;   EYE SURGERY Bilateral    cataracts   HEMORRHOID SURGERY     HEMORRHOID SURGERY N/A 02/12/2021   Procedure: HEMORRHOIDECTOMY SINGLE COLUMN;   Surgeon: Debby Hila, MD;  Location[facility];  Service: General;  Laterality: N/A;   PATCH ANGIOPLASTY Left 05/22/2024   Procedure: ANGIOPLASTY, USING SAPHENOUS VENOUS PATCH GRAFT;  Surgeon: Sheree Penne Bruckner, MD;  Location: [facility] OR;  Service: Vascular;  Laterality: Left;   TONSILLECTOMY  as child   VEIN HARVEST Left 05/22/2024   Procedure: LEFT SAPHENOUS VEIN HARVEST;  Surgeon: Sheree Penne Bruckner, MD;  Location: [facility] OR;  Service: Vascular;  Laterality: Left;  Saphenous vein harvest    Medications  Current Outpatient Medications on File Prior to Visit  Medication Sig Dispense Refill   aspirin  81 MG tablet Take 1 tablet (81 mg total) by mouth daily.     atorvastatin  (LIPITOR ) 80 MG tablet TAKE 1 TABLET EVERY DAY AT 6PM 90 tablet 0   ezetimibe  (ZETIA ) 10 MG tablet TAKE 1 TABLET EVERY DAY (Patient taking differently: Take 10 mg by mouth daily at 6 PM.) 90 tablet 1   metoprolol  tartrate (LOPRESSOR ) 50 MG tablet TAKE 1 TABLET TWICE DAILY 180 tablet 3   omeprazole (PRILOSEC) 20 MG capsule Take 20 mg by mouth daily.     tadalafil  (CIALIS ) 5 MG tablet Take 1 tablet by mouth once daily 90 tablet 0   Allergies No Known Allergies  Family History Family History  Problem Relation Age of Onset   Cancer Mother 2       Leukemia   Coronary artery disease  Father 24       MI    Review of Systems: Constitutional:  no fevers Eye:  no recent significant change in vision Ears:  No changes in hearing Nose/Mouth/Throat:  no complaints of nasal congestion, no sore throat Cardiovascular: no chest pain Respiratory:  No shortness of breath Gastrointestinal:  No change in bowel habits GU:  No frequency Integumentary:  no abnormal skin lesions reported Neurologic:  no headaches Endocrine:  denies unexplained weight changes  Exam BP 130/74 (BP Location: Left Arm, Patient Position: Sitting)   Pulse 91   Temp 98 F (36.7 C) (Oral)   Resp 16   Ht 5' 7 (1.702 m)   Wt 215  lb 6.4 oz (97.7 kg)   SpO2 96%   BMI 33.74 kg/m  General:  well developed, well nourished, in no apparent distress Skin:  no significant moles, warts, or growths Head:  no masses, lesions, or tenderness Eyes:  pupils equal and round, sclera anicteric without injection Ears:  canals without lesions, TMs shiny without retraction, no obvious effusion, no erythema Nose:  nares patent, mucosa normal Throat/Pharynx:  lips and gingiva without lesion; tongue and uvula midline; non-inflamed pharynx; no exudates or postnasal drainage Lungs:  clear to auscultation, breath sounds equal bilaterally, no respiratory distress Cardio:  regular rate and rhythm, no LE edema or bruits Rectal: Deferred GI: BS+, S, NT, ND, no masses or organomegaly Musculoskeletal:  symmetrical muscle groups noted without atrophy or deformity Neuro:  gait normal; deep tendon reflexes normal and symmetric Psych: well oriented with normal range of affect and appropriate judgment/insight  Assessment and Plan  Well adult exam  Insomnia, unspecified type - Plan: zolpidem  (AMBIEN ) 10 MG tablet  Atherosclerosis of native coronary artery of native heart without angina pectoris - Plan: CBC, Comprehensive metabolic panel with GFR, Lipid panel   Well 78 y.o. male. Counseled on diet and exercise. Flu shot today.  Discussed getting Td booster and Shingrix at the pharmacy.  Other orders as above. Follow up in 6 mo.  The patient voiced understanding and agreement to the plan.  Mabel Mt Slinger, DO 08/24/24 1:32 PM \\F2 

## 2024-08-24 NOTE — Patient Instructions (Addendum)
 Give us  2-3 business days to get the results of your labs back.   Keep the diet clean and stay active.  Aim to do some physical exertion for 150 minutes per week. This is typically divided into 5 days per week, 30 minutes per day. The activity should be enough to get your heart rate up. Anything is better than nothing if you have time constraints.  Consider getting the tetanus booster at the pharmacy.   The Shingrix vaccine (for shingles) is a 2 shot series spaced 2-6 months apart. It can make people feel low energy, achy and almost like they have the flu for 48 hours after injection. 1/5 people can have nausea and/or vomiting. Please plan accordingly when deciding on when to get this shot. Call your pharmacy to get this. The second shot of the series is less severe regarding the side effects, but it still lasts 48 hours.   Let us  know if you need anything.

## 2024-08-25 ENCOUNTER — Other Ambulatory Visit: Payer: Self-pay | Admitting: Cardiology

## 2024-08-25 ENCOUNTER — Ambulatory Visit: Payer: Self-pay | Admitting: Family Medicine

## 2024-08-25 DIAGNOSIS — E7800 Pure hypercholesterolemia, unspecified: Secondary | ICD-10-CM

## 2024-08-27 ENCOUNTER — Telehealth: Payer: Self-pay

## 2024-08-27 ENCOUNTER — Other Ambulatory Visit: Payer: Self-pay

## 2024-08-27 ENCOUNTER — Other Ambulatory Visit: Payer: Self-pay | Admitting: Family Medicine

## 2024-08-27 DIAGNOSIS — N401 Enlarged prostate with lower urinary tract symptoms: Secondary | ICD-10-CM

## 2024-08-27 DIAGNOSIS — N529 Male erectile dysfunction, unspecified: Secondary | ICD-10-CM

## 2024-08-27 MED ORDER — TADALAFIL 5 MG PO TABS: 5.0000 mg | ORAL_TABLET | Freq: Every day | ORAL | 0 refills | Status: AC

## 2024-08-27 NOTE — Telephone Encounter (Signed)
 error

## 2024-08-27 NOTE — Telephone Encounter (Signed)
 Copied from CRM [id number]. Topic: Clinical - Lab/Test Results >> Aug 27, 2024  2:03 PM Deaijah H wrote: Reason for CRM: PatientS wife would like to know if Dr. Frann could send his bloodwork results through mail

## 2024-08-28 ENCOUNTER — Telehealth: Payer: Self-pay | Admitting: Family Medicine

## 2024-08-28 ENCOUNTER — Other Ambulatory Visit: Payer: Self-pay

## 2024-08-28 ENCOUNTER — Other Ambulatory Visit: Payer: Self-pay | Admitting: Family Medicine

## 2024-08-28 DIAGNOSIS — G4700 Insomnia, unspecified: Secondary | ICD-10-CM

## 2024-08-28 DIAGNOSIS — R7989 Other specified abnormal findings of blood chemistry: Secondary | ICD-10-CM

## 2024-08-28 LAB — CBC
HCT: 35.5 % — ABNORMAL LOW (ref 39.4–51.1)
Hemoglobin: 10.3 g/dL — ABNORMAL LOW (ref 13.2–17.1)
MCH: 22.5 pg — ABNORMAL LOW (ref 27.0–33.0)
MCHC: 29 g/dL — ABNORMAL LOW (ref 31.6–35.4)
MCV: 77.5 fL — ABNORMAL LOW (ref 81.4–101.7)
MPV: 10.9 fL (ref 7.5–12.5)
Platelets: 325 Thousand/uL (ref 140–400)
RBC: 4.58 Million/uL (ref 4.20–5.80)
RDW: 15.5 % — ABNORMAL HIGH (ref 11.0–15.0)
WBC: 8 Thousand/uL (ref 3.8–10.8)

## 2024-08-28 LAB — COMPREHENSIVE METABOLIC PANEL WITH GFR
AG Ratio: 1.6 (calc) (ref 1.0–2.5)
ALT: 15 U/L (ref 9–46)
AST: 21 U/L (ref 10–35)
Albumin: 3.9 g/dL (ref 3.6–5.1)
Alkaline phosphatase (APISO): 74 U/L (ref 35–144)
BUN/Creatinine Ratio: 8 (calc) (ref 6–22)
BUN: 10 mg/dL (ref 7–25)
CO2: 25 mmol/L (ref 20–32)
Calcium: 8.9 mg/dL (ref 8.6–10.3)
Chloride: 104 mmol/L (ref 98–110)
Creat: 1.24 mg/dL — ABNORMAL HIGH (ref 0.70–1.22)
Globulin: 2.5 g/dL (ref 1.9–3.7)
Glucose, Bld: 119 mg/dL — ABNORMAL HIGH (ref 65–99)
Potassium: 4.8 mmol/L (ref 3.5–5.3)
Sodium: 138 mmol/L (ref 135–146)
Total Bilirubin: 0.5 mg/dL (ref 0.2–1.2)
Total Protein: 6.4 g/dL (ref 6.1–8.1)
eGFR: 59 mL/min/1.73m2 — ABNORMAL LOW (ref >=60)

## 2024-08-28 LAB — TEST AUTHORIZATION

## 2024-08-28 LAB — LIPID PANEL
Cholesterol: 79 mg/dL (ref <200)
HDL: 32 mg/dL — ABNORMAL LOW (ref >=40)
LDL Cholesterol (Calc): 29 mg/dL
Non-HDL Cholesterol (Calc): 47 mg/dL (ref <130)
Total CHOL/HDL Ratio: 2.5 (calc) (ref <5.0)
Triglycerides: 93 mg/dL (ref <150)

## 2024-08-28 LAB — IRON,TIBC AND FERRITIN PANEL
%SAT: 4 % — ABNORMAL LOW (ref 20–48)
Ferritin: 6 ng/mL — ABNORMAL LOW (ref 24–380)
Iron: 14 ug/dL — ABNORMAL LOW (ref 50–180)
TIBC: 335 ug/dL (ref 250–425)

## 2024-08-28 LAB — B12 AND FOLATE PANEL
Folate: 11.4 ng/mL
Vitamin B-12: 269 pg/mL (ref 200–1100)

## 2024-08-28 MED ORDER — ZOLPIDEM TARTRATE 10 MG PO TABS: 10.0000 mg | ORAL_TABLET | Freq: Every evening | ORAL | 1 refills | Status: DC | PRN

## 2024-08-28 MED ORDER — ZOLPIDEM TARTRATE 10 MG PO TABS: 10.0000 mg | ORAL_TABLET | Freq: Every evening | ORAL | 1 refills | Status: AC | PRN

## 2024-08-28 NOTE — Telephone Encounter (Signed)
 Copied from CRM [id number]. Topic: Clinical - Medication Refill >> Aug 28, 2024  3:12 PM Leah C wrote: Medication: zolpidem  (AMBIEN ) 10 MG tablet  Has the patient contacted their pharmacy? [facility] called the patient and stated they they never received authorization for a refill for the prescription.  It's been over a week. Patient saw Dr. Frann and was told that he would fax over the prescription. Pharmacy called patient and stated that they didn't receive anything. Patient is completely out of prescription and they would like expedited, if possible.   This is the patient's preferred pharmacy:  [facility] - [place], [place] - [address] [place] [address] Phone: [phone] Fax: [phone]   Is this the correct pharmacy for this prescription? Yes If no, delete pharmacy and type the correct one.   Has the prescription been filled recently? Yes  Is the patient out of the medication? Yes  Has the patient been seen for an appointment in the last year OR does the patient have an upcoming appointment? Yes  Can we respond through MyChart? They would prefer a callback from Dr. Frann or Dr. Konrad nurse on why prescription was not sent.   Agent: Please be advised that Rx refills may take up to 3 business days. We ask that you follow-up with your pharmacy.

## 2024-08-28 NOTE — Telephone Encounter (Signed)
Called pt was advised refill was sent

## 2024-08-28 NOTE — Telephone Encounter (Signed)
Sent again now

## 2024-08-28 NOTE — Telephone Encounter (Signed)
 Refill sent

## 2024-08-29 ENCOUNTER — Encounter: Payer: Self-pay | Admitting: Vascular Surgery

## 2024-08-29 ENCOUNTER — Ambulatory Visit: Admitting: Vascular Surgery

## 2024-08-29 ENCOUNTER — Other Ambulatory Visit: Payer: Self-pay

## 2024-08-29 ENCOUNTER — Ambulatory Visit (HOSPITAL_COMMUNITY): Admission: RE | Admit: 2024-08-29 | Discharge: 2024-08-29 | Attending: Surgery | Admitting: Surgery

## 2024-08-29 VITALS — BP 126/74 | HR 71 | Temp 97.9°F | Ht 67.0 in | Wt 212.0 lb

## 2024-08-29 DIAGNOSIS — I6522 Occlusion and stenosis of left carotid artery: Secondary | ICD-10-CM | POA: Insufficient documentation

## 2024-08-29 DIAGNOSIS — E611 Iron deficiency: Secondary | ICD-10-CM

## 2024-08-29 NOTE — Progress Notes (Signed)
 Patient ID: Ethan Estrada, male   DOB: Feb 14, 1946, 78 y.o.   MRN: [id number]  Reason for Consult: Follow-up   Referred by Frann Mabel Mt*  Subjective:     HPI:  Ethan Estrada is a 78 y.o. male history of carotid endarterectomy with dacron patch now status post redo carotid endarterectomy and patch angioplasty with vein for draining sinus.  He has done very well back to his baseline.  He had no neurologic sequela from the procedure.  He continues to smoke cigars.  He remains compliant with aspirin  and statin.  Past Medical History:  Diagnosis Date   Acute myocardial infarction, unspecified site, episode of care unspecified 1985   CAD (coronary artery disease)    Carotid artery occlusion    Cerebrovascular disease, unspecified    Coronary atherosclerosis of unspecified type of vessel, native or graft    GERD (gastroesophageal reflux disease)    Hemorrhoids    History of heart bypass surgery    MI (myocardial infarction) (HCC) 2010   Other and unspecified hyperlipidemia    Peripheral vascular disease    Carotid Artery Stenosis   Renal hematoma 2005   Wears dentures    full set   Wears glasses    Family History  Problem Relation Age of Onset   Cancer Mother 45       Leukemia   Coronary artery disease Father 66       MI   Past Surgical History:  Procedure Laterality Date   ARTERY EXPLORATION Left 05/22/2024   Procedure: EXCISION OF LEFT CAROTID DACRON PATCH;  Surgeon: Sheree Penne Bruckner, MD;  Location: [facility] OR;  Service: Vascular;  Laterality: Left;   CERVICAL SPINE SURGERY  1995   COLONOSCOPY WITH ESOPHAGOGASTRODUODENOSCOPY (EGD)  yrs ago due 2022   CORONARY ARTERY BYPASS GRAFT  x 3   S/P in February of 2010. Procedure performed in [place]   ENDARTERECTOMY Left 10/12/2018   Procedure: ENDARTERECTOMY CAROTID LEFT;  Surgeon: Oris Krystal FALCON, MD;  Location: [facility] OR;  Service: Vascular;  Laterality: Left;   ENDARTERECTOMY Left 05/22/2024   Procedure: LEFT  CAROTID ENDARTERECTOMY;  Surgeon: Sheree Penne Bruckner, MD;  Location: [facility] OR;  Service: Vascular;  Laterality: Left;   EYE SURGERY Bilateral    cataracts   HEMORRHOID SURGERY     HEMORRHOID SURGERY N/A 02/12/2021   Procedure: HEMORRHOIDECTOMY SINGLE COLUMN;  Surgeon: Debby Hila, MD;  Location: [facility] [place];  Service: General;  Laterality: N/A;   PATCH ANGIOPLASTY Left 05/22/2024   Procedure: ANGIOPLASTY, USING SAPHENOUS VENOUS PATCH GRAFT;  Surgeon: Sheree Penne Bruckner, MD;  Location: [facility] OR;  Service: Vascular;  Laterality: Left;   TONSILLECTOMY  as child   VEIN HARVEST Left 05/22/2024   Procedure: LEFT SAPHENOUS VEIN HARVEST;  Surgeon: Sheree Penne Bruckner, MD;  Location: [facility] OR;  Service: Vascular;  Laterality: Left;  Saphenous vein harvest    Short Social History:  Social History   Tobacco Use   Smoking status: Every Day    Types: Cigars   Smokeless tobacco: Never   Tobacco comments:    Now smoking cigars 2-3 cigares per day  Substance Use Topics   Alcohol use: Not Currently    Comment: occasionally    No Known Allergies  Current Outpatient Medications  Medication Sig Dispense Refill   aspirin  81 MG tablet Take 1 tablet (81 mg total) by mouth daily.     atorvastatin  (LIPITOR ) 80 MG tablet TAKE 1 TABLET EVERY DAY AT 6PM 90  tablet 0   ezetimibe  (ZETIA ) 10 MG tablet TAKE 1 TABLET EVERY DAY (Patient taking differently: Take 10 mg by mouth daily at 6 PM.) 90 tablet 1   metoprolol  tartrate (LOPRESSOR ) 50 MG tablet TAKE 1 TABLET TWICE DAILY 180 tablet 3   omeprazole (PRILOSEC) 20 MG capsule Take 20 mg by mouth daily.     tadalafil  (CIALIS ) 5 MG tablet Take 1 tablet (5 mg total) by mouth daily. 90 tablet 0   zolpidem  (AMBIEN ) 10 MG tablet Take 1 tablet (10 mg total) by mouth at bedtime as needed for sleep. 90 tablet 1   No current facility-administered medications for this visit.    Review of Systems  Constitutional:  Constitutional negative. HENT:  HENT negative.  Eyes: Eyes negative.  Cardiovascular: Cardiovascular negative.  GI: Gastrointestinal negative.  Musculoskeletal: Musculoskeletal negative.  Skin: Skin negative.  Neurological: Neurological negative. Hematologic: Hematologic/lymphatic negative.        Objective:  Objective   Vitals:   08/29/24 1113 08/29/24 1114  BP: (!) 149/78 126/74  Pulse: 71   Temp: 97.9 F (36.6 C)   SpO2: 95%   Weight: 212 lb (96.2 kg)   Height: 5' 7 (1.702 m)    Body mass index is 33.2 kg/m.  Physical Exam HENT:     Head: Normocephalic.     Nose: Nose normal.  Eyes:     Pupils: Pupils are equal, round, and reactive to light.  Neck:     Vascular: No carotid bruit.     Comments: Well-healed left neck incision Cardiovascular:     Rate and Rhythm: Normal rate.  Abdominal:     General: Abdomen is flat.  Musculoskeletal:        General: Normal range of motion.     Cervical back: Neck supple.     Right lower leg: No edema.     Left lower leg: No edema.  Skin:    General: Skin is warm.     Capillary Refill: Capillary refill takes less than 2 seconds.  Neurological:     General: No focal deficit present.     Mental Status: He is alert.     Data: Right Carotid Findings:  +----------+--------+--------+--------+------------------+--------+           PSV cm/sEDV cm/sStenosisPlaque DescriptionComments  +----------+--------+--------+--------+------------------+--------+  CCA Prox  66      9                                           +----------+--------+--------+--------+------------------+--------+  CCA Mid   67      15                                          +----------+--------+--------+--------+------------------+--------+  CCA Distal127     22                                          +----------+--------+--------+--------+------------------+--------+  ICA Prox  143     26      1-39%   heterogenous                 +----------+--------+--------+--------+------------------+--------+  ICA Mid   83      22                                          +----------+--------+--------+--------+------------------+--------+  ICA Distal100     32                                          +----------+--------+--------+--------+------------------+--------+  ECA      137     17                                          +----------+--------+--------+--------+------------------+--------+   +----------+--------+-------+----------------+-------------------+           PSV cm/sEDV cmsDescribe        Arm Pressure (mmHG)  +----------+--------+-------+----------------+-------------------+  [id number]           Multiphasic, [id number]                  +----------+--------+-------+----------------+-------------------+   +---------+--------+--+--------+--+---------+  VertebralPSV cm/s58EDV cm/s12Antegrade  +---------+--------+--+--------+--+---------+      Left Carotid Findings:  +----------+--------+--------+--------+------------------+--------+           PSV cm/sEDV cm/sStenosisPlaque DescriptionComments  +----------+--------+--------+--------+------------------+--------+  CCA Prox  97      17                                          +----------+--------+--------+--------+------------------+--------+  CCA Mid   64      13              homogeneous                 +----------+--------+--------+--------+------------------+--------+  CCA Distal67      11                                          +----------+--------+--------+--------+------------------+--------+  ICA Prox  77      16      Normal                              +----------+--------+--------+--------+------------------+--------+  ICA Mid   100     29                                          +----------+--------+--------+--------+------------------+--------+  ICA Distal102     31                                           +----------+--------+--------+--------+------------------+--------+  ECA      55      26                                          +----------+--------+--------+--------+------------------+--------+   +----------+--------+--------+----------------+-------------------+           PSV cm/sEDV cm/sDescribe        Arm Pressure (mmHG)  +----------+--------+--------+----------------+-------------------+  Subclavian140    0       Multiphasic, [id number]                  +----------+--------+--------+----------------+-------------------+   +---------+--------+--+--------+--+---------+  VertebralPSV cm/s96EDV cm/s28Antegrade  +---------+--------+--+--------+--+---------+         Summary:  Right Carotid: Velocities in the right ICA are consistent with a 1-39%  stenosis.   Left Carotid: There is no evidence of stenosis in the left ICA.                Widely patent carotid endarterectomy site.   Vertebrals:  Bilateral vertebral arteries demonstrate antegrade flow.  Subclavians: Normal flow hemodynamics were seen in bilateral subclavian               arteries.      Assessment/Plan:     78 year old male status post excision of left carotid dacryon patch for draining sinus with vein patch angioplasty now recovering well without residual stenosis in the left carotid artery.  Plan will be for follow-up in 9 months with repeat carotid duplex.     Penne Lonni [place] MD [facility]

## 2024-09-05 DIAGNOSIS — Z23 Encounter for immunization: Secondary | ICD-10-CM | POA: Diagnosis not present

## 2024-09-05 NOTE — Addendum Note (Signed)
 Addended by: Koleson Reifsteck M on: 09/05/2024 05:31 PM   Modules accepted: Orders

## 2024-10-04 ENCOUNTER — Telehealth: Payer: Self-pay | Admitting: *Deleted

## 2024-10-04 DIAGNOSIS — E611 Iron deficiency: Secondary | ICD-10-CM

## 2024-10-04 NOTE — Telephone Encounter (Signed)
 Spoke with patient and advised him of this and he did not know what it was for.  I just advised him his iron level was low an we were just concerned about bleeding.  Ifob being mailed out to him again and advised when he completes to call and set up lab appt to bring back and to a urine test.

## 2024-10-04 NOTE — Addendum Note (Signed)
 Addended by: ESTELLE GILLIS D on: 10/04/2024 04:10 PM   Modules accepted: Orders

## 2024-10-04 NOTE — Telephone Encounter (Signed)
-----   Message from Mabel Pry, DO sent at 10/04/2024 11:33 AM EST ----- Can we see if pt is willing to follow up on this? I want to make sure he isn't bleeding from the GI or GU tract with the stool and urine sample. Thx.

## 2024-10-07 ENCOUNTER — Other Ambulatory Visit: Payer: Self-pay | Admitting: Cardiology

## 2024-10-18 NOTE — Telephone Encounter (Signed)
 Called to follow up and spoke with wife and she stated that he did get the ifob and once he is done to call and make an appt to bring in and do urine test.

## 2024-11-07 ENCOUNTER — Ambulatory Visit: Admitting: Cardiology

## 2025-03-12 ENCOUNTER — Ambulatory Visit
